# Patient Record
Sex: Female | Born: 1975 | Race: Black or African American | Hispanic: No | Smoking: Never smoker
Health system: Southern US, Community
[De-identification: ages and names within clinical notes are randomized; demographics above are authoritative.]

## PROBLEM LIST (undated history)

## (undated) DIAGNOSIS — Z9221 Personal history of antineoplastic chemotherapy: Secondary | ICD-10-CM

## (undated) DIAGNOSIS — T8859XA Other complications of anesthesia, initial encounter: Secondary | ICD-10-CM

## (undated) DIAGNOSIS — K589 Irritable bowel syndrome without diarrhea: Secondary | ICD-10-CM

## (undated) DIAGNOSIS — Z923 Personal history of irradiation: Secondary | ICD-10-CM

## (undated) DIAGNOSIS — R0600 Dyspnea, unspecified: Secondary | ICD-10-CM

## (undated) DIAGNOSIS — E119 Type 2 diabetes mellitus without complications: Secondary | ICD-10-CM

## (undated) DIAGNOSIS — K219 Gastro-esophageal reflux disease without esophagitis: Secondary | ICD-10-CM

## (undated) DIAGNOSIS — Z8632 Personal history of gestational diabetes: Secondary | ICD-10-CM

## (undated) DIAGNOSIS — K279 Peptic ulcer, site unspecified, unspecified as acute or chronic, without hemorrhage or perforation: Secondary | ICD-10-CM

## (undated) DIAGNOSIS — O210 Mild hyperemesis gravidarum: Secondary | ICD-10-CM

## (undated) DIAGNOSIS — C801 Malignant (primary) neoplasm, unspecified: Secondary | ICD-10-CM

## (undated) DIAGNOSIS — J189 Pneumonia, unspecified organism: Secondary | ICD-10-CM

## (undated) DIAGNOSIS — T7840XA Allergy, unspecified, initial encounter: Secondary | ICD-10-CM

## (undated) DIAGNOSIS — D649 Anemia, unspecified: Secondary | ICD-10-CM

## (undated) HISTORY — DX: Anemia, unspecified: D64.9

## (undated) HISTORY — DX: Irritable bowel syndrome, unspecified: K58.9

## (undated) HISTORY — DX: Type 2 diabetes mellitus without complications: E11.9

## (undated) HISTORY — DX: Peptic ulcer, site unspecified, unspecified as acute or chronic, without hemorrhage or perforation: K27.9

## (undated) HISTORY — DX: Allergy, unspecified, initial encounter: T78.40XA

## (undated) HISTORY — DX: Pneumonia, unspecified organism: J18.9

---

## 1998-01-01 ENCOUNTER — Emergency Department (HOSPITAL_COMMUNITY): Admission: EM | Admit: 1998-01-01 | Discharge: 1998-01-01 | Payer: Self-pay | Admitting: Emergency Medicine

## 1998-04-27 ENCOUNTER — Emergency Department (HOSPITAL_COMMUNITY): Admission: EM | Admit: 1998-04-27 | Discharge: 1998-04-27 | Payer: Self-pay | Admitting: Emergency Medicine

## 1998-06-15 ENCOUNTER — Emergency Department (HOSPITAL_COMMUNITY): Admission: EM | Admit: 1998-06-15 | Discharge: 1998-06-15 | Payer: Self-pay | Admitting: Emergency Medicine

## 1999-03-22 ENCOUNTER — Ambulatory Visit (HOSPITAL_COMMUNITY): Admission: RE | Admit: 1999-03-22 | Discharge: 1999-03-22 | Payer: Self-pay | Admitting: Obstetrics & Gynecology

## 2003-03-16 ENCOUNTER — Emergency Department (HOSPITAL_COMMUNITY): Admission: EM | Admit: 2003-03-16 | Discharge: 2003-03-16 | Payer: Self-pay | Admitting: Emergency Medicine

## 2003-05-20 DIAGNOSIS — Z8632 Personal history of gestational diabetes: Secondary | ICD-10-CM

## 2003-05-20 HISTORY — DX: Personal history of gestational diabetes: Z86.32

## 2004-05-19 HISTORY — PX: LEFT OOPHORECTOMY: SHX1961

## 2012-05-19 HISTORY — PX: BILATERAL SALPINGECTOMY: SHX5743

## 2016-05-05 ENCOUNTER — Ambulatory Visit (INDEPENDENT_AMBULATORY_CARE_PROVIDER_SITE_OTHER): Payer: BLUE CROSS/BLUE SHIELD | Admitting: Physician Assistant

## 2016-05-05 VITALS — BP 130/82 | HR 96 | Temp 98.6°F | Resp 17 | Ht 70.0 in | Wt 186.0 lb

## 2016-05-05 DIAGNOSIS — K0381 Cracked tooth: Secondary | ICD-10-CM | POA: Diagnosis not present

## 2016-05-05 DIAGNOSIS — K0889 Other specified disorders of teeth and supporting structures: Secondary | ICD-10-CM

## 2016-05-05 MED ORDER — AMOXICILLIN-POT CLAVULANATE 875-125 MG PO TABS: 1.0000 | ORAL_TABLET | Freq: Two times a day (BID) | ORAL | 0 refills | Status: AC

## 2016-05-05 NOTE — Progress Notes (Signed)
Patient ID: Catherine Ellison, female     DOB: Jun 04, 1975, 40 y.o.    MRN: [id number]  PCP: No primary care provider on file.  Chief Complaint  Patient presents with  . Dental Pain    Started wednesday. "feels like someone is pulling my jaw out". NKI.     Subjective:   This patient is new to this practice and presents for evaluation of dental pain.  Tooth pain began 6 days ago. Feels a hole in the tooth when she rubs it with her tongue. Pain is constant and throbbing, and worse with chewing or pressing on the tooth.  No fever, chills. No drainage. No swelling of the jaw, neck or face.  Describes the sensation that the tooth is coming out, and that someone is pulling on the RIGHT jaw, laterally.  Had a Investment banker, corporate in [place], [place]. Had one impacted wisdom tooth extracted. Is concerned that she won't be able to get in with a dentist due to the holidays. 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  Review of Systems As above.  Prior to Admission medications   Medication Sig Start Date End Date Taking? Authorizing Provider  dicyclomine (BENTYL) 10 MG capsule Take 10 mg by mouth 4 (four) times daily -  before meals and at bedtime.   Yes Historical Provider, MD     No Known Allergies   There are no active problems to display for this patient.    Family History  Problem Relation Age of Onset  . Diabetes Mother   . Hypertension Mother   . Diabetes Father   . Heart disease Father     4V CABG 03/2016, age 46  . Hyperlipidemia Father   . Hypertension Father   . Diabetes Sister   . Hypertension Sister      Social History   Social History  . Marital status: Divorced    Spouse name: N/A  . Number of children: N/A  . Years of education: N/A   Occupational History  . nurse     mental health   Social History Main Topics  . Smoking status: Never Smoker  . Smokeless tobacco: Never Used  . Alcohol use 1.2 oz/week    2  Standard drinks or equivalent per week  . Drug use: Unknown  . Sexual activity: Not on file   Other Topics Concern  . Not on file   Social History Narrative  . No narrative on file         Objective:  Physical Exam  Constitutional: She is oriented to person, place, and time. She appears well-developed and well-nourished. She is active and cooperative. No distress.  BP 130/82 (BP Location: Left Arm, Patient Position: Sitting, Cuff Size: Normal)   Pulse 96   Temp 98.6 F (37 C) (Oral)   Resp 17   Ht 5\' 10"  (1.778 m)   Wt 186 lb (84.4 kg)   LMP 04/03/2016 (Approximate)   SpO2 99%   BMI 26.69 kg/m    HENT:  Head: Normocephalic and atraumatic.  Right Ear: Hearing normal.  Left Ear: Hearing normal.  Mouth/Throat: Uvula is midline, oropharynx is clear and moist and mucous membranes are normal. No oral lesions. Abnormal dentition. Dental caries present.    Eyes: Conjunctivae are normal. No scleral icterus.  Neck: Normal range of motion. Neck supple. No thyromegaly present.  Pulmonary/Chest: Effort normal.  Lymphadenopathy:    She has no cervical adenopathy.  Neurological: She is alert and oriented to person, place, and time.  Skin: Skin is warm and dry.  Psychiatric: She has a normal mood and affect. Her speech is normal and behavior is normal.             Assessment & Plan:  1. Pain, dental 2. Broken or cracked tooth, nontraumatic Suspect infection, given constant pain. NSAIDS. Antibiotic. Chew on the LEFT side of the mouth, soft foods. Schedule with dentists as soon as possible. - amoxicillin-clavulanate (AUGMENTIN) 875-125 MG tablet; Take 1 tablet by mouth 2 (two) times daily.  Dispense: 20 tablet; Refill: 0   Fara Chute, PA-C Physician Assistant-Certified [facility]

## 2016-05-05 NOTE — Patient Instructions (Addendum)
Get scheduled with a dentist as soon as you are able. Use ibuprofen and/or acetaminophen for pain.    IF you received an x-ray today, you will receive an invoice from [facility]. Please contact [facility] at [phone] with questions or concerns regarding your invoice.   IF you received labwork today, you will receive an invoice from [facility]. Please contact [facility] at [phone] with questions or concerns regarding your invoice.   Our billing staff will not be able to assist you with questions regarding bills from these companies.  You will be contacted with the lab results as soon as they are available. The fastest way to get your results is to activate your My Chart account. Instructions are located on the last page of this paperwork. If you have not heard from [place] regarding the results in 2 weeks, please contact this office.

## 2016-08-20 ENCOUNTER — Ambulatory Visit (INDEPENDENT_AMBULATORY_CARE_PROVIDER_SITE_OTHER): Payer: BLUE CROSS/BLUE SHIELD | Admitting: Physician Assistant

## 2016-08-20 VITALS — BP 115/72 | HR 96 | Temp 98.2°F | Resp 16 | Ht 70.0 in | Wt 182.4 lb

## 2016-08-20 DIAGNOSIS — Z0000 Encounter for general adult medical examination without abnormal findings: Secondary | ICD-10-CM | POA: Diagnosis not present

## 2016-08-20 DIAGNOSIS — Z789 Other specified health status: Secondary | ICD-10-CM | POA: Diagnosis not present

## 2016-08-20 DIAGNOSIS — Z23 Encounter for immunization: Secondary | ICD-10-CM

## 2016-08-20 DIAGNOSIS — K5900 Constipation, unspecified: Secondary | ICD-10-CM

## 2016-08-20 DIAGNOSIS — Z13220 Encounter for screening for lipoid disorders: Secondary | ICD-10-CM | POA: Diagnosis not present

## 2016-08-20 DIAGNOSIS — Z1329 Encounter for screening for other suspected endocrine disorder: Secondary | ICD-10-CM | POA: Diagnosis not present

## 2016-08-20 DIAGNOSIS — Z130 Encounter for screening for diseases of the blood and blood-forming organs and certain disorders involving the immune mechanism: Secondary | ICD-10-CM | POA: Diagnosis not present

## 2016-08-20 DIAGNOSIS — G479 Sleep disorder, unspecified: Secondary | ICD-10-CM

## 2016-08-20 DIAGNOSIS — Z131 Encounter for screening for diabetes mellitus: Secondary | ICD-10-CM

## 2016-08-20 DIAGNOSIS — Z114 Encounter for screening for human immunodeficiency virus [HIV]: Secondary | ICD-10-CM | POA: Diagnosis not present

## 2016-08-20 MED ORDER — POLYETHYLENE GLYCOL 3350 17 GM/SCOOP PO POWD: 17.0000 g | Freq: Two times a day (BID) | ORAL | 1 refills | Status: DC | PRN

## 2016-08-20 MED ORDER — MELATONIN 3 MG PO TABS: 3.0000 mg | ORAL_TABLET | Freq: Every day | ORAL | 2 refills | Status: DC

## 2016-08-20 NOTE — Patient Instructions (Addendum)
Take Melatonin to help you sleep.   Thank you for coming in today. I hope you feel we met your needs.  Feel free to call [facility] if you have any questions or further requests.  Please consider signing up for MyChart if you do not already have it, as this is a great way to communicate with me.  Best,  Whitney , PA-C  For constipation   Make sure you are drinking enough water daily -- 1 to 3 liters/day.   Make sure you are getting enough fiber in your diet - this will make you regular - you can eat high fiber foods or use metamucil as a supplement - it is really important to drink enough water when using fiber supplements.  If your stools are hard or are formed balls or you have to strain a stool softener will help - use colace 2-3 capsule a day  1) For gentle treatment of constipation Use Miralax 1-2 capfuls a day until your stools are soft and regular and then decrease the usage - you can use this daily  2) For more aggressive treatment of constipation Use 4 capfuls of Colace and 6 doses of Miralax and drink it in 2 hours - this should result in several watery stools - if it does not repeat the next day and then go to daily miralax for a week to make sure your bowels are clean and retrained to work properly  3) For the most aggressive treatment of constipation Use 14 capfuls of Miralax in 1 gallon of fluid (gatoraid or water work well or a combination of the two) and drink over 12h - it is ok to eat during this time and then use Miralax 1 capful daily for about 2 weeks to prevent the constipation from returning   Constipation, Adult Constipation is when a person has fewer bowel movements in a week than normal, has difficulty having a bowel movement, or has stools that are dry, hard, or larger than normal. Constipation may be caused by an underlying condition. It may become worse with age if a person takes certain medicines and does not take in enough fluids. Follow these instructions  at home: Eating and drinking    Eat foods that have a lot of fiber, such as fresh fruits and vegetables, whole grains, and beans.  Limit foods that are high in fat, low in fiber, or overly processed, such as french fries, hamburgers, cookies, candies, and soda.  Drink enough fluid to keep your urine clear or pale yellow. General instructions   Exercise regularly or as told by your health care provider.  Go to the restroom when you have the urge to go. Do not hold it in.  Take over-the-counter and prescription medicines only as told by your health care provider. These include any fiber supplements.  Practice pelvic floor retraining exercises, such as deep breathing while relaxing the lower abdomen and pelvic floor relaxation during bowel movements.  Watch your condition for any changes.  Keep all follow-up visits as told by your health care provider. This is important. Contact a health care provider if:  You have pain that gets worse.  You have a fever.  You do not have a bowel movement after 4 days.  You vomit.  You are not hungry.  You lose weight.  You are bleeding from the anus.  You have thin, pencil-like stools. Get help right away if:  You have a fever and your symptoms suddenly get worse.    You leak stool or have blood in your stool.  Your abdomen is bloated.  You have severe pain in your abdomen.  You feel dizzy or you faint. This information is not intended to replace advice given to you by your health care provider. Make sure you discuss any questions you have with your health care provider. Document Released: 02/01/2004 Document Revised: 11/23/2015 Document Reviewed: 10/24/2015 Elsevier Interactive Patient Education  2017 [employer]   Health Maintenance, Female Adopting a healthy lifestyle and getting preventive care can go a long way to promote health and wellness. Talk with your health care provider about what schedule of regular examinations is  right for you. This is a good chance for you to check in with your provider about disease prevention and staying healthy. In between checkups, there are plenty of things you can do on your own. Experts have done a lot of research about which lifestyle changes and preventive measures are most likely to keep you healthy. Ask your health care provider for more information. Weight and diet Eat a healthy diet  Be sure to include plenty of vegetables, fruits, low-fat dairy products, and lean protein.  Do not eat a lot of foods high in solid fats, added sugars, or salt.  Get regular exercise. This is one of the most important things you can do for your health.  Most adults should exercise for at least 150 minutes each week. The exercise should increase your heart rate and make you sweat (moderate-intensity exercise).  Most adults should also do strengthening exercises at least twice a week. This is in addition to the moderate-intensity exercise. Maintain a healthy weight  Body mass index (BMI) is a measurement that can be used to identify possible weight problems. It estimates body fat based on height and weight. Your health care provider can help determine your BMI and help you achieve or maintain a healthy weight.  For females 20 years of age and older:  A BMI below 18.5 is considered underweight.  A BMI of 18.5 to 24.9 is normal.  A BMI of 25 to 29.9 is considered overweight.  A BMI of 30 and above is considered obese. Watch levels of cholesterol and blood lipids  You should start having your blood tested for lipids and cholesterol at 43 years of age, then have this test every 5 years.  You may need to have your cholesterol levels checked more often if:  Your lipid or cholesterol levels are high.  You are older than 43 years of age.  You are at high risk for heart disease. Cancer screening Lung Cancer  Lung cancer screening is recommended for adults 55-80 years old who are at  high risk for lung cancer because of a history of smoking.  A yearly low-dose CT scan of the lungs is recommended for people who:  Currently smoke.  Have quit within the past 15 years.  Have at least a 30-pack-year history of smoking. A pack year is smoking an average of one pack of cigarettes a day for 1 year.  Yearly screening should continue until it has been 15 years since you quit.  Yearly screening should stop if you develop a health problem that would prevent you from having lung cancer treatment. Breast Cancer  Practice breast self-awareness. This means understanding how your breasts normally appear and feel.  It also means doing regular breast self-exams. Let your health care provider know about any changes, no matter how small.  If you are in   your 20s or 30s, you should have a clinical breast exam (CBE) by a health care provider every 1-3 years as part of a regular health exam.  If you are 40 or older, have a CBE every year. Also consider having a breast X-ray (mammogram) every year.  If you have a family history of breast cancer, talk to your health care provider about genetic screening.  If you are at high risk for breast cancer, talk to your health care provider about having an MRI and a mammogram every year.  Breast cancer gene (BRCA) assessment is recommended for women who have family members with BRCA-related cancers. BRCA-related cancers include:  Breast.  Ovarian.  Tubal.  Peritoneal cancers.  Results of the assessment will determine the need for genetic counseling and BRCA1 and BRCA2 testing. Cervical Cancer  Your health care provider may recommend that you be screened regularly for cancer of the pelvic organs (ovaries, uterus, and vagina). This screening involves a pelvic examination, including checking for microscopic changes to the surface of your cervix (Pap test). You may be encouraged to have this screening done every 3 years, beginning at age  21.  For women ages 30-65, health care providers may recommend pelvic exams and Pap testing every 3 years, or they may recommend the Pap and pelvic exam, combined with testing for human papilloma virus (HPV), every 5 years. Some types of HPV increase your risk of cervical cancer. Testing for HPV may also be done on women of any age with unclear Pap test results.  Other health care providers may not recommend any screening for nonpregnant women who are considered low risk for pelvic cancer and who do not have symptoms. Ask your health care provider if a screening pelvic exam is right for you.  If you have had past treatment for cervical cancer or a condition that could lead to cancer, you need Pap tests and screening for cancer for at least 20 years after your treatment. If Pap tests have been discontinued, your risk factors (such as having a new sexual partner) need to be reassessed to determine if screening should resume. Some women have medical problems that increase the chance of getting cervical cancer. In these cases, your health care provider may recommend more frequent screening and Pap tests. Colorectal Cancer  This type of cancer can be detected and often prevented.  Routine colorectal cancer screening usually begins at 43 years of age and continues through 43 years of age.  Your health care provider may recommend screening at an earlier age if you have risk factors for colon cancer.  Your health care provider may also recommend using home test kits to check for hidden blood in the stool.  A small camera at the end of a tube can be used to examine your colon directly (sigmoidoscopy or colonoscopy). This is done to check for the earliest forms of colorectal cancer.  Routine screening usually begins at age 50.  Direct examination of the colon should be repeated every 5-10 years through 43 years of age. However, you may need to be screened more often if early forms of precancerous polyps  or small growths are found. Skin Cancer  Check your skin from head to toe regularly.  Tell your health care provider about any new moles or changes in moles, especially if there is a change in a mole's shape or color.  Also tell your health care provider if you have a mole that is larger than the size of a   pencil eraser.  Always use sunscreen. Apply sunscreen liberally and repeatedly throughout the day.  Protect yourself by wearing long sleeves, pants, a wide-brimmed hat, and sunglasses whenever you are outside. Heart disease, diabetes, and high blood pressure  High blood pressure causes heart disease and increases the risk of stroke. High blood pressure is more likely to develop in:  People who have blood pressure in the high end of the normal range (130-139/85-89 mm Hg).  People who are overweight or obese.  People who are African American.  If you are 18-39 years of age, have your blood pressure checked every 3-5 years. If you are 40 years of age or older, have your blood pressure checked every year. You should have your blood pressure measured twice-once when you are at a hospital or clinic, and once when you are not at a hospital or clinic. Record the average of the two measurements. To check your blood pressure when you are not at a hospital or clinic, you can use:  An automated blood pressure machine at a pharmacy.  A home blood pressure monitor.  If you are between 55 years and 79 years old, ask your health care provider if you should take aspirin to prevent strokes.  Have regular diabetes screenings. This involves taking a blood sample to check your fasting blood sugar level.  If you are at a normal weight and have a low risk for diabetes, have this test once every three years after 43 years of age.  If you are overweight and have a high risk for diabetes, consider being tested at a younger age or more often. Preventing infection Hepatitis B  If you have a higher risk  for hepatitis B, you should be screened for this virus. You are considered at high risk for hepatitis B if:  You were born in a country where hepatitis B is common. Ask your health care provider which countries are considered high risk.  Your parents were born in a high-risk country, and you have not been immunized against hepatitis B (hepatitis B vaccine).  You have HIV or AIDS.  You use needles to inject street drugs.  You live with someone who has hepatitis B.  You have had sex with someone who has hepatitis B.  You get hemodialysis treatment.  You take certain medicines for conditions, including cancer, organ transplantation, and autoimmune conditions. Hepatitis C  Blood testing is recommended for:  Everyone born from 1945 through 1965.  Anyone with known risk factors for hepatitis C. Sexually transmitted infections (STIs)  You should be screened for sexually transmitted infections (STIs) including gonorrhea and chlamydia if:  You are sexually active and are younger than 43 years of age.  You are older than 43 years of age and your health care provider tells you that you are at risk for this type of infection.  Your sexual activity has changed since you were last screened and you are at an increased risk for chlamydia or gonorrhea. Ask your health care provider if you are at risk.  If you do not have HIV, but are at risk, it may be recommended that you take a prescription medicine daily to prevent HIV infection. This is called pre-exposure prophylaxis (PrEP). You are considered at risk if:  You are sexually active and do not regularly use condoms or know the HIV status of your partner(s).  You take drugs by injection.  You are sexually active with a partner who has HIV. Talk with your health   care provider about whether you are at high risk of being infected with HIV. If you choose to begin PrEP, you should first be tested for HIV. You should then be tested every 3 months  for as long as you are taking PrEP. Pregnancy  If you are premenopausal and you may become pregnant, ask your health care provider about preconception counseling.  If you may become pregnant, take 400 to 800 micrograms (mcg) of folic acid every day.  If you want to prevent pregnancy, talk to your health care provider about birth control (contraception). Osteoporosis and menopause  Osteoporosis is a disease in which the bones lose minerals and strength with aging. This can result in serious bone fractures. Your risk for osteoporosis can be identified using a bone density scan.  If you are 65 years of age or older, or if you are at risk for osteoporosis and fractures, ask your health care provider if you should be screened.  Ask your health care provider whether you should take a calcium or vitamin D supplement to lower your risk for osteoporosis.  Menopause may have certain physical symptoms and risks.  Hormone replacement therapy may reduce some of these symptoms and risks. Talk to your health care provider about whether hormone replacement therapy is right for you. Follow these instructions at home:  Schedule regular health, dental, and eye exams.  Stay current with your immunizations.  Do not use any tobacco products including cigarettes, chewing tobacco, or electronic cigarettes.  If you are pregnant, do not drink alcohol.  If you are breastfeeding, limit how much and how often you drink alcohol.  Limit alcohol intake to no more than 1 drink per day for nonpregnant women. One drink equals 12 ounces of beer, 5 ounces of wine, or 1 ounces of hard liquor.  Do not use street drugs.  Do not share needles.  Ask your health care provider for help if you need support or information about quitting drugs.  Tell your health care provider if you often feel depressed.  Tell your health care provider if you have ever been abused or do not feel safe at home. This information is not  intended to replace advice given to you by your health care provider. Make sure you discuss any questions you have with your health care provider. Document Released: 11/18/2010 Document Revised: 10/11/2015 Document Reviewed: 02/06/2015 Elsevier Interactive Patient Education  2017 [employer].

## 2016-08-20 NOTE — Progress Notes (Signed)
[facility] at [place], [place] [address] [phone]  Date:  08/20/2016   Name:  Catherine Ellison   DOB:  1976-04-13   MRN:  [id number]  PCP:  No PCP Per Patient    Chief Complaint: Annual Exam   History of Present Illness:  This is a 41 y.o. female with PMH allergies and anemia who is presenting for CPE. She has 3 children. 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 She needs titers drawn today for school.  She plans to become an NP. She lives in [place], [place] She is fasting.   H/o IBS - Bentyl qd. Mostly controlled.   Complaints: Wakes up/tosses and turns at night. She admits to snoring, denies apnea.  LMP: Jul 31, 2016. Periods are regularly irregular.  Contraception: none. H/o b/l salpingectomy.  Last pap: 2017 in [place]. Negative. She performs regular breast exams.  Sexual history: Active with current boyfriend x 2.5 years. No other partners.  Immunizations: UTD Dentist: sees q yearly. Eye: regular checks.  Diet/Exercise: exercises 4-5 days/week at [employer]. Vegetables, herbs, fish, salads, noodles. Drinks warm lemon water. She recently cut back on sodas. Fast food every once in a while.   Fam hx: mother- HTN and DM; father - DM, heart disease, HTN, HLD; sister DM and HTN.   Tobacco/alcohol/substance use: Non smoker, no drugs. Social drinker: 3 drinks/week   Colonoscopy: 2004 for IBS/Diverticulitis.   Review of Systems:  Review of Systems  Constitutional: Negative for chills, diaphoresis, fatigue and fever.  HENT: Negative for congestion, postnasal drip, rhinorrhea, sinus pressure, sneezing and sore throat.   Respiratory: Negative for cough, chest tightness, shortness of breath and wheezing.   Cardiovascular: Negative for chest pain and palpitations.  Gastrointestinal: Positive for abdominal pain (due to IBS). Negative for diarrhea, nausea and vomiting.  Genitourinary: Negative for decreased urine volume, difficulty urinating, dysuria, enuresis, flank  pain, frequency, hematuria and urgency.  Musculoskeletal: Negative for back pain.  Neurological: Negative for dizziness, weakness, light-headedness and headaches.  Psychiatric/Behavioral: Positive for sleep disturbance.    There are no active problems to display for this patient.   Prior to Admission medications   Medication Sig Start Date End Date Taking? Authorizing Provider  dicyclomine (BENTYL) 10 MG capsule Take 10 mg by mouth 4 (four) times daily -  before meals and at bedtime.   Yes Historical Provider, MD    No Known Allergies  No past surgical history on file.  Social History  Substance Use Topics  . Smoking status: Never Smoker  . Smokeless tobacco: Never Used  . Alcohol use 1.2 oz/week    2 Standard drinks or equivalent per week    Family History  Problem Relation Age of Onset  . Diabetes Mother   . Hypertension Mother   . Diabetes Father   . Heart disease Father     4V CABG 03/2016, age 29  . Hyperlipidemia Father   . Hypertension Father   . Diabetes Sister   . Hypertension Sister     Medication list has been reviewed and updated.  Physical Examination:  Physical Exam  Constitutional: She is oriented to person, place, and time. She appears well-developed and well-nourished. No distress.  HENT:  Head: Normocephalic and atraumatic.  Mouth/Throat: Oropharynx is clear and moist.  Eyes: Conjunctivae and EOM are normal. Pupils are equal, round, and reactive to light.  Cardiovascular: Normal rate, regular rhythm and normal heart sounds.   No murmur heard. Pulmonary/Chest: Effort normal and breath sounds normal.  She has no wheezes.  Abdominal: Soft. Normal appearance and bowel sounds are normal. There is no hepatosplenomegaly. There is generalized tenderness. There is no rigidity, no rebound, no guarding and negative Murphy's sign.  Musculoskeletal: Normal range of motion.  Neurological: She is alert and oriented to person, place, and time. She has normal  reflexes.  Skin: Skin is warm and dry.  Psychiatric: She has a normal mood and affect. Her behavior is normal. Judgment and thought content normal.  Vitals reviewed.   BP 115/72   Pulse 96   Temp 98.2 F (36.8 C) (Oral)   Resp 16   Ht _0  (1.778 m)   Wt 182 lb 6.4 oz (82.7 kg)   LMP 08/01/2016   SpO2 98%   BMI 26.17 kg/m   Assessment and Plan: 1. Annual physical exam - Pt presents for annual exam - needs paper work and titers drawn for nursing school. IBS controlled with Bentyl. C/o constipation and difficulty sleeping through the night - will treat. RTC in 1 year for fasting labs. - Labs are pending. Will contact with results.    2. Constipation, unspecified constipation type - polyethylene glycol powder (GLYCOLAX/MIRALAX) powder; Take 17 g by mouth 2 (two) times daily as needed.  Dispense: 578 g; Refill: 1 - Encouraged fluids, fiber, fitness.   3. Sleep disorder - Melatonin 3 MG TABS; Take 1 tablet (3 mg total) by mouth at bedtime.  Dispense: 60 tablet; Refill: 2 - Sleep hygiene discussed with pt.   4. Screening for HIV (human immunodeficiency virus) - HIV antibody  5. Screening for diabetes mellitus - CMP14+EGFR  6. Screening, anemia, deficiency, iron - CBC w diff  7. Screening for thyroid disorder - TSH  8. Screening, lipid - Lipid panel  9. Need for diphtheria-tetanus-pertussis (Tdap) vaccine - Tdap vaccine greater than or equal to 7yo IM  10. History of measles, mumps, rubella (MMR) vaccination unknown - Measles/Mumps/Rubella Immunity  11. Varicella vaccination status unknown - Varicella zoster antibody, IgG  12. Hepatitis B vaccination status unknown - Hepatitis B e antibody   Mercer Pod, PA-C  [facility] 08/20/2016 4:43 PM

## 2016-08-21 LAB — LIPID PANEL
Chol/HDL Ratio: 3.2 ratio (ref 0.0–4.4)
Cholesterol, Total: 136 mg/dL (ref 100–199)
HDL: 43 mg/dL (ref >39)
LDL Calculated: 77 mg/dL (ref 0–99)
Triglycerides: 82 mg/dL (ref 0–149)
VLDL Cholesterol Cal: 16 mg/dL (ref 5–40)

## 2016-08-21 LAB — CBC WITH DIFFERENTIAL/PLATELET
Basophils Absolute: 0 10*3/uL (ref 0.0–0.2)
Basos: 0 %
EOS (ABSOLUTE): 0.1 10*3/uL (ref 0.0–0.4)
Eos: 2 %
Hematocrit: 33.8 % — ABNORMAL LOW (ref 34.0–46.6)
Hemoglobin: 10.6 g/dL — ABNORMAL LOW (ref 11.1–15.9)
Immature Grans (Abs): 0 10*3/uL (ref 0.0–0.1)
Immature Granulocytes: 0 %
Lymphocytes Absolute: 2.5 10*3/uL (ref 0.7–3.1)
Lymphs: 31 %
MCH: 25.3 pg — ABNORMAL LOW (ref 26.6–33.0)
MCHC: 31.4 g/dL — ABNORMAL LOW (ref 31.5–35.7)
MCV: 81 fL (ref 79–97)
Monocytes Absolute: 0.5 10*3/uL (ref 0.1–0.9)
Monocytes: 6 %
Neutrophils Absolute: 4.8 10*3/uL (ref 1.4–7.0)
Neutrophils: 61 %
Platelets: 342 10*3/uL (ref 150–379)
RBC: 4.19 x10E6/uL (ref 3.77–5.28)
RDW: 16.3 % — ABNORMAL HIGH (ref 12.3–15.4)
WBC: 8 10*3/uL (ref 3.4–10.8)

## 2016-08-21 LAB — HEPATITIS B E ANTIBODY: Hep B E Ab: NEGATIVE

## 2016-08-21 LAB — CMP14+EGFR
ALT: 9 IU/L (ref 0–32)
AST: 18 IU/L (ref 0–40)
Albumin/Globulin Ratio: 1.4 (ref 1.2–2.2)
Albumin: 4 g/dL (ref 3.5–5.5)
Alkaline Phosphatase: 93 IU/L (ref 39–117)
BUN/Creatinine Ratio: 12 (ref 9–23)
BUN: 11 mg/dL (ref 6–24)
Bilirubin Total: 0.3 mg/dL (ref 0.0–1.2)
CO2: 18 mmol/L (ref 18–29)
Calcium: 8.9 mg/dL (ref 8.7–10.2)
Chloride: 103 mmol/L (ref 96–106)
Creatinine, Ser: 0.93 mg/dL (ref 0.57–1.00)
GFR calc Af Amer: 88 mL/min/{1.73_m2} (ref >59)
GFR calc non Af Amer: 76 mL/min/{1.73_m2} (ref >59)
Globulin, Total: 2.9 g/dL (ref 1.5–4.5)
Glucose: 94 mg/dL (ref 65–99)
Potassium: 4.3 mmol/L (ref 3.5–5.2)
Sodium: 138 mmol/L (ref 134–144)
Total Protein: 6.9 g/dL (ref 6.0–8.5)

## 2016-08-21 LAB — TSH: TSH: 1.8 u[IU]/mL (ref 0.450–4.500)

## 2016-08-21 LAB — VARICELLA ZOSTER ANTIBODY, IGG: Varicella zoster IgG: 800 index (ref >165)

## 2016-08-21 LAB — HIV ANTIBODY (ROUTINE TESTING W REFLEX): HIV Screen 4th Generation wRfx: NONREACTIVE

## 2016-08-21 LAB — MEASLES/MUMPS/RUBELLA IMMUNITY
MUMPS ABS, IGG: 76.8 AU/mL (ref >10.9)
RUBEOLA AB, IGG: >300 AU/mL (ref >29.9)
Rubella Antibodies, IGG: 1.85 index (ref >0.99)

## 2016-08-25 ENCOUNTER — Telehealth: Payer: Self-pay

## 2016-08-25 NOTE — Telephone Encounter (Signed)
TY. Will release to mychart.

## 2016-08-25 NOTE — Telephone Encounter (Signed)
Pt came in for lab results, copies given, please review, any concerns?

## 2016-09-09 ENCOUNTER — Encounter: Payer: Self-pay | Admitting: Family Medicine

## 2016-09-09 ENCOUNTER — Ambulatory Visit (INDEPENDENT_AMBULATORY_CARE_PROVIDER_SITE_OTHER): Payer: BLUE CROSS/BLUE SHIELD | Admitting: Family Medicine

## 2016-09-09 VITALS — BP 110/72 | HR 86 | Temp 98.0°F | Resp 18 | Ht 70.04 in | Wt 179.6 lb

## 2016-09-09 DIAGNOSIS — R030 Elevated blood-pressure reading, without diagnosis of hypertension: Secondary | ICD-10-CM | POA: Diagnosis not present

## 2016-09-09 DIAGNOSIS — Z111 Encounter for screening for respiratory tuberculosis: Secondary | ICD-10-CM | POA: Diagnosis not present

## 2016-09-09 NOTE — Progress Notes (Signed)
  Tuberculosis Risk Questionnaire  1. No Were you born outside the [place] in one of the following parts of the world: [place], [place], [place], [place] or [place]?    2. No Have you traveled outside the [place] and lived for more than one month in one of the following parts of the world: [place], [place], [place], [place] or [place]?    3. No Do you have a compromised immune system such as from any of the following conditions:HIV/AIDS, organ or bone marrow transplantation, diabetes, immunosuppressive medicines (e.g. Prednisone, Remicaide), leukemia, lymphoma, cancer of the head or neck, gastrectomy or jejunal bypass, end-stage renal disease (on dialysis), or silicosis?     4. YES Have you ever or do you plan on working in: a residential care center, a health care facility, a jail or prison or homeless shelter?    5. No Have you ever: injected illegal drugs, used crack cocaine, lived in a homeless shelter  or been in jail or prison?     6. No Have you ever been exposed to anyone with infectious tuberculosis?    Tuberculosis Symptom Questionnaire  Do you currently have any of the following symptoms?  1. No Unexplained cough lasting more than 3 weeks?   2. No Unexplained fever lasting more than 3 weeks.   3. No Night Sweats (sweating that leaves the bedclothes and sheets wet)     4. No Shortness of Breath   5. No Chest Pain   6. No Unintentional weight loss    7. No Unexplained fatigue (very tired for no reason)

## 2016-09-09 NOTE — Progress Notes (Signed)
  Chief Complaint  Patient presents with  . 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 Allergy    seasonal   . Anemia   . Diabetes mellitus without complication [facility])    Gestational    Current Outpatient Prescriptions  Medication Sig Dispense Refill  . dicyclomine (BENTYL) 10 MG capsule Take 10 mg by mouth 4 (four) times daily -  before meals and at bedtime.    . Melatonin 3 MG TABS Take 1 tablet (3 mg total) by mouth at bedtime. 60 tablet 2  . polyethylene glycol powder (GLYCOLAX/MIRALAX) powder Take 17 g by mouth 2 (two) times daily as needed. 578 g 1   No current facility-administered medications for this visit.     Allergies: No Known Allergies  Past Surgical History:  Procedure Laterality Date  . BILATERAL SALPINGECTOMY Bilateral 2014   emergency  . LEFT OOPHORECTOMY Left 2006    Social History   Social History  . Marital status: Divorced    Spouse name: N/A  . Number of children: N/A  . Years of education: N/A   Occupational History  . nurse     mental health   Social History Main Topics  . Smoking status: Never Smoker  . Smokeless tobacco: Never Used  . Alcohol use 3.0 oz/week    2 Standard drinks or equivalent, 1 Shots of liquor, 1 Cans of beer, 1 Glasses of wine per week  . Drug use: No  . Sexual activity: Not Asked   Other Topics Concern  . None   Social History Narrative   Lives with her youngest child.   Older children are in college.    Review of Systems  Constitutional: Negative for chills, fever, malaise/fatigue and weight loss.  Respiratory: Negative for cough, hemoptysis, sputum  production, shortness of breath and wheezing.   Neurological: Negative for weakness.    Objective: Vitals:   09/09/16 1155 09/09/16 1216  BP: (!) 158/100 110/72  Pulse: 86   Resp: 18   Temp: 98 F (36.7 C)   TempSrc: Oral   SpO2: 100%   Weight: 179 lb 9.6 oz (81.5 kg)   Height: 5' 10.04" (1.779 m)     Physical Exam  Constitutional: She is oriented to person, place, and time. She appears well-developed and well-nourished.  HENT:  Head: Normocephalic and atraumatic.  Cardiovascular: Normal rate and regular rhythm.   Pulmonary/Chest: Effort normal and breath sounds normal. No respiratory distress.  Neurological: She is alert and oriented to person, place, and time.  Psychiatric: She has a normal mood and affect. Her behavior is normal. Judgment and thought content normal.    Assessment and Plan Jacqlyn was seen today for ppd reading.  Diagnoses and all orders for this visit:  Screening-pulmonary TB -     TB Skin Test  Elevated BP without diagnosis of hypertension- bp recheck was normal Discussed home bp monitoring     Zelma Snead A Barnett Elzey

## 2016-09-09 NOTE — Patient Instructions (Addendum)
IF you received an x-ray today, you will receive an invoice from [facility]. Please contact [facility] at [phone] with questions or concerns regarding your invoice.   IF you received labwork today, you will receive an invoice from [facility]. Please contact [facility] at [phone] with questions or concerns regarding your invoice.   Our billing staff will not be able to assist you with questions regarding bills from these companies.  You will be contacted with the lab results as soon as they are available. The fastest way to get your results is to activate your My Chart account. Instructions are located on the last page of this paperwork. If you have not heard from [place] regarding the results in 2 weeks, please contact this office.      How to Take Your Blood Pressure Blood pressure is a measurement of how strongly your blood is pressing against the walls of your arteries. Arteries are blood vessels that carry blood from your heart throughout your body. Your health care provider takes your blood pressure at each office visit. You can also take your own blood pressure at home with a blood pressure machine. You may need to take your own blood pressure:  To confirm a diagnosis of high blood pressure (hypertension).  To monitor your blood pressure over time.  To make sure your blood pressure medicine is working. Supplies needed: To take your blood pressure, you will need a blood pressure machine. You can buy a blood pressure machine, or blood pressure monitor, at most drugstores or online. There are several types of home blood pressure monitors. When choosing one, consider the following:  Choose a monitor that has an arm cuff.  Choose a monitor that wraps snugly around your upper arm. You should be able to fit only one finger between your arm and the cuff.  Do not choose a monitor that measures your blood pressure from your wrist or finger. Your health care  provider can suggest a reliable monitor that will meet your needs. How to prepare To get the most accurate reading, avoid the following for 30 minutes before you check your blood pressure:  Drinking caffeine.  Drinking alcohol.  Eating.  Smoking.  Exercising. Five minutes before you check your blood pressure:  Empty your bladder.  Sit quietly without talking in a dining chair, rather than in a soft couch or armchair. How to take your blood pressure To check your blood pressure, follow the instructions in the manual that came with your blood pressure monitor. If you have a digital blood pressure monitor, the instructions may be as follows: 1. Sit up straight. 2. Place your feet on the floor. Do not cross your ankles or legs. 3. Rest your left arm at the level of your heart on a table or desk or on the arm of a chair. 4. Pull up your shirt sleeve. 5. Wrap the blood pressure cuff around the upper part of your left arm, 1 inch (2.5 cm) above your elbow. It is best to wrap the cuff around bare skin. 6. Fit the cuff snugly around your arm. You should be able to place only one finger between the cuff and your arm. 7. Position the cord inside the groove of your elbow. 8. Press the power button. 9. Sit quietly while the cuff inflates and deflates. 10. Read the digital reading on the monitor screen and write it down (record it). 11. Wait 2-3 minutes, then repeat the steps, starting at step 1.  What does my blood pressure reading mean? A blood pressure reading consists of a higher number over a lower number. Ideally, your blood pressure should be below 120/80. The first ("top") number is called the systolic pressure. It is a measure of the pressure in your arteries as your heart beats. The second ("bottom") number is called the diastolic pressure. It is a measure of the pressure in your arteries as the heart relaxes. Blood pressure is classified into four stages. The following are the stages for  adults who do not have a short-term serious illness or a chronic condition. Systolic pressure and diastolic pressure are measured in a unit called mm Hg. Normal   Systolic pressure: below 784.  Diastolic pressure: below 80. Elevated   Systolic pressure: 696-295.  Diastolic pressure: below 80. Hypertension stage 1   Systolic pressure: 284-132.  Diastolic pressure: 44-01. Hypertension stage 2   Systolic pressure: 027 or above.  Diastolic pressure: 90 or above. You can have prehypertension or hypertension even if only the systolic or only the diastolic number in your reading is higher than normal. Follow these instructions at home:  Check your blood pressure as often as recommended by your health care provider.  Take your monitor to the next appointment with your health care provider to make sure:  That you are using it correctly.  That it provides accurate readings.  Be sure you understand what your goal blood pressure numbers are.  Tell your health care provider if you are having any side effects from blood pressure medicine. Contact a health care provider if:  Your blood pressure is consistently high. Get help right away if:  Your systolic blood pressure is higher than 180.  Your diastolic blood pressure is higher than 110. This information is not intended to replace advice given to you by your health care provider. Make sure you discuss any questions you have with your health care provider. Document Released: 10/12/2015 Document Revised: 12/25/2015 Document Reviewed: 10/12/2015 Elsevier Interactive Patient Education  2017 [employer].

## 2016-09-12 ENCOUNTER — Ambulatory Visit (INDEPENDENT_AMBULATORY_CARE_PROVIDER_SITE_OTHER): Payer: BLUE CROSS/BLUE SHIELD | Admitting: Urgent Care

## 2016-09-12 DIAGNOSIS — Z111 Encounter for screening for respiratory tuberculosis: Secondary | ICD-10-CM

## 2016-09-12 LAB — TB SKIN TEST
Induration: 0 mm
TB Skin Test: NEGATIVE

## 2016-09-16 ENCOUNTER — Ambulatory Visit (INDEPENDENT_AMBULATORY_CARE_PROVIDER_SITE_OTHER): Payer: BLUE CROSS/BLUE SHIELD | Admitting: Physician Assistant

## 2016-09-16 DIAGNOSIS — Z299 Encounter for prophylactic measures, unspecified: Secondary | ICD-10-CM

## 2016-09-16 NOTE — Patient Instructions (Signed)
     IF you received an x-ray today, you will receive an invoice from [facility]. Please contact [facility] at [phone] with questions or concerns regarding your invoice.   IF you received labwork today, you will receive an invoice from [facility]. Please contact [facility] at [phone] with questions or concerns regarding your invoice.   Our billing staff will not be able to assist you with questions regarding bills from these companies.  You will be contacted with the lab results as soon as they are available. The fastest way to get your results is to activate your My Chart account. Instructions are located on the last page of this paperwork. If you have not heard from us regarding the results in 2 weeks, please contact this office.     

## 2016-09-16 NOTE — Progress Notes (Signed)
Here for 2nd step tb test   Feeling well.  Given on left inner forearm

## 2016-09-19 ENCOUNTER — Ambulatory Visit: Payer: BLUE CROSS/BLUE SHIELD | Admitting: Physician Assistant

## 2016-09-19 DIAGNOSIS — Z111 Encounter for screening for respiratory tuberculosis: Secondary | ICD-10-CM

## 2016-09-19 NOTE — Progress Notes (Signed)
Immunization History  Administered Date(s) Administered  . PPD Test 09/09/2016, 09/16/2016  . Tdap 08/20/2016

## 2016-11-06 ENCOUNTER — Encounter: Payer: Self-pay | Admitting: Physician Assistant

## 2016-11-06 ENCOUNTER — Ambulatory Visit (INDEPENDENT_AMBULATORY_CARE_PROVIDER_SITE_OTHER): Payer: BLUE CROSS/BLUE SHIELD | Admitting: Physician Assistant

## 2016-11-06 VITALS — BP 138/94 | HR 65 | Temp 98.7°F | Resp 18 | Ht 70.0 in | Wt 171.8 lb

## 2016-11-06 DIAGNOSIS — N63 Unspecified lump in unspecified breast: Secondary | ICD-10-CM | POA: Diagnosis not present

## 2016-11-06 DIAGNOSIS — F4323 Adjustment disorder with mixed anxiety and depressed mood: Secondary | ICD-10-CM | POA: Diagnosis not present

## 2016-11-06 MED ORDER — DOXYCYCLINE HYCLATE 100 MG PO CAPS: 100.0000 mg | ORAL_CAPSULE | Freq: Two times a day (BID) | ORAL | 0 refills | Status: AC

## 2016-11-06 MED ORDER — SERTRALINE HCL 50 MG PO TABS: 50.0000 mg | ORAL_TABLET | Freq: Every day | ORAL | 3 refills | Status: DC

## 2016-11-06 MED ORDER — MELOXICAM 15 MG PO TABS: 15.0000 mg | ORAL_TABLET | Freq: Every day | ORAL | 1 refills | Status: DC

## 2016-11-06 NOTE — Progress Notes (Signed)
Patient ID: Catherine Ellison, female    DOB: November 05, 1975, 41 y.o.   MRN: [id number]  PCP: Dorise Hiss, PA-C  Chief Complaint  Patient presents with  . Breast Mass    x7 days,per pt painful, warm, "puffy"    Subjective:   Presents for evaluation of a painful lump in the LEFT breast.  She first noticed this lump in the shower about 1 week ago. The area feels "full" and "puffy." She has pain when the breast hangs without support. Acetaminophen without relief. Rates the pain 9/10. No previous breast pain like this previously.  She has a lump in each breast, previously examined by GYN (Dr. Tarri Glenn in [place], [place]) and reportedly benign, though she has had no imaging. Neither of these is painful.  She has bilateral nipple piercing x 3 years. No infections. The jewelry on the RIGHT has been removed.  No fever, chills. No nausea, vomiting. No redness of the skin or other skin changes.  She recently ended a long-time relationship and has had loss of appetite, nausea/dyspepsia. She relates that she has lost about 20 lbs in the past month and asks for something to increase her appetite. "I can't lose any more weight."  She is in school to become a Designer, jewellery. Her 43 year old lives independently. Her 19 (in college) and 26 year olds live at home.    Review of Systems As above.    There are no active problems to display for this patient.    Prior to Admission medications   Medication Sig Start Date End Date Taking? Authorizing Provider  dicyclomine (BENTYL) 10 MG capsule Take 10 mg by mouth 4 (four) times daily -  before meals and at bedtime.   Yes [provider]  Melatonin 3 MG TABS Take 1 tablet (3 mg total) by mouth at bedtime. 08/20/16  Yes McVey, Gelene Mink, PA-C  polyethylene glycol powder (GLYCOLAX/MIRALAX) powder Take 17 g by mouth 2 (two) times daily as needed. 08/20/16  Yes McVey, Gelene Mink, PA-C     No Known  Allergies     Objective:  Physical Exam  Constitutional: She is oriented to person, place, and time. She appears well-developed and well-nourished. She is active and cooperative. No distress.  BP (!) 138/94 (BP Location: Left Arm, Patient Position: Sitting, Cuff Size: Normal)   Pulse 65   Temp 98.7 F (37.1 C) (Oral)   Resp 18   Ht 5\' 10"  (1.778 m)   Wt 171 lb 12.8 oz (77.9 kg)   LMP 10/13/2016   SpO2 100%   BMI 24.65 kg/m   HENT:  Head: Normocephalic and atraumatic.  Right Ear: Hearing normal.  Left Ear: Hearing normal.  Eyes: Conjunctivae are normal. No scleral icterus.  Neck: Normal range of motion. Neck supple. No thyromegaly present.  Cardiovascular: Normal rate, regular rhythm and normal heart sounds.   Pulses:      Radial pulses are 2+ on the right side, and 2+ on the left side.  Pulmonary/Chest: Effort normal and breath sounds normal. Right breast exhibits no inverted nipple (piercing not present), no mass, no nipple discharge, no skin change and no tenderness. Left breast exhibits mass, skin change and tenderness. Left breast exhibits no inverted nipple (piercing) and no nipple discharge. Breasts are symmetrical.    Lymphadenopathy:       Head (right side): No tonsillar, no preauricular, no posterior auricular and no occipital adenopathy present.       Head (left  side): No tonsillar, no preauricular, no posterior auricular and no occipital adenopathy present.    She has no cervical adenopathy.       Right: No supraclavicular adenopathy present.       Left: No supraclavicular adenopathy present.  Neurological: She is alert and oriented to person, place, and time. No sensory deficit.  Skin: Skin is warm, dry and intact. No rash noted. No cyanosis or erythema. Nails show no clubbing.  Psychiatric: She has a normal mood and affect. Her speech is normal and behavior is normal.    Wt Readings from Last 3 Encounters:  11/06/16 171 lb 12.8 oz (77.9 kg)  09/09/16 179 lb 9.6  oz (81.5 kg)  08/20/16 182 lb 6.4 oz (82.7 kg)       Assessment & Plan:   Problem List Items Addressed This Visit    Breast lump - Primary    2 lumps. The tender lump may be mastitis, as she has a piercing in the nipple of that breast. Start doxycycline, meloxicam and warm compresses. Mammogram and [place]. The non-tender lump will be evaluated on these studies. Consider screening vs diagnostic mammogram on the RIGHT breast as well.      Relevant Medications   doxycycline (VIBRAMYCIN) 100 MG capsule   meloxicam (MOBIC) 15 MG tablet   Other Relevant Orders   MM DIAG BREAST TOMO UNI LEFT   US BREAST LTD UNI LEFT INC AXILLA   Adjustment disorder with mixed anxiety and depressed mood    Start SSRI.       Relevant Medications   sertraline (ZOLOFT) 50 MG tablet       Return in about 6 weeks (around 12/18/2016) for re-evaluation of mood.   Fara Chute, PA-C [facility]

## 2016-11-06 NOTE — Progress Notes (Signed)
Subjective:    Patient ID: Catherine Ellison, female    DOB: 09/11/1975, 41 y.o.   MRN: [id number] PCP: Patient, No Pcp Per Chief Complaint  Patient presents with  . Breast Mass    x7 days,per pt painful, warm, "puffy"    HPI: 41 y/o F presents to clinic complaining of a new lump in her left breast that is painful. She noticed it in the shower about a week ago. At first it was not painful, but later in the day it felt sore and has since become increasingly painful. States that it is about the size of a grape and has not changed in size since. The pain radiated around the lump and into her ribs on that side. 9/10 pain. States that there is no change in skin color or texture on breast. Denies changes in nipple or any discharge from nipple. Denies fevers, night sweats. 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 Denies history of cysts in her breasts, or breast cancer in her family. Does note that she has 2 other non-tender breast lumps on in her right breast and the other in left breast that have been evaluated by her GYN without needing imaging. She does not smoke. She had a bilateral salpingectomy in 2014 and left oophorectomy in 2006.   There are no active problems to display for this patient.  Past Medical History:  Diagnosis Date  . Allergy    seasonal   . Anemia   . Diabetes mellitus without complication ([place])    Gestational   Prior to Admission medications   Medication Sig Start Date End Date Taking? Authorizing Provider  dicyclomine (BENTYL) 10 MG capsule Take 10 mg by mouth 4 (four) times daily -  before meals and at bedtime.   Yes [provider]  Melatonin 3 MG TABS Take 1 tablet (3 mg total) by mouth at bedtime. 08/20/16  Yes McVey, Gelene Mink, PA-C  polyethylene glycol powder (GLYCOLAX/MIRALAX) powder Take 17 g by mouth 2 (two) times daily as  needed. 08/20/16  Yes McVey, Gelene Mink, PA-C  doxycycline (VIBRAMYCIN) 100 MG capsule Take 1 capsule (100 mg total) by mouth 2 (two) times daily. 11/06/16 11/16/16  Harrison Mons, PA-C  meloxicam (MOBIC) 15 MG tablet Take 1 tablet (15 mg total) by mouth daily. 11/06/16   Harrison Mons, PA-C  sertraline (ZOLOFT) 50 MG tablet Take 1 tablet (50 mg total) by mouth daily. 11/06/16   Harrison Mons, PA-C   No Known Allergies   Review of Systems     Objective:   Physical Exam  Constitutional: She appears well-developed and well-nourished. No distress.  BP (!) 138/94 (BP Location: Left Arm, Patient Position: Sitting, Cuff Size: Normal)   Pulse 65   Temp 98.7 F (37.1 C) (Oral)   Resp 18   Ht 5\' 10"  (1.778 m)   Wt 171 lb 12.8 oz (77.9 kg)   LMP 10/13/2016   SpO2 100%   BMI 24.65 kg/m    HENT:  Head: Normocephalic and atraumatic.  Eyes: Conjunctivae and EOM are normal. Pupils are equal, round, and reactive to light.  Neck: Normal range of motion. Neck supple. No thyromegaly present.  Cardiovascular: Normal rate, regular rhythm, normal heart sounds and intact distal pulses.   Pulmonary/Chest: Right breast exhibits mass (2 cm non tender firm round nodule about 12 oclock. ). Right  breast exhibits no inverted nipple, no nipple discharge, no skin change and no tenderness. Left breast exhibits mass (3 cm non tender firm round nodule about 11 oclock. 3 cm very tender, not well demarcated nodule about 3 oclock. ) and tenderness. Left breast exhibits no inverted nipple, no nipple discharge and no skin change. Breasts are symmetrical.    Nipple piercing noted on nipple of left breast.  Lymphadenopathy:    She has no cervical adenopathy.  Skin: Skin is warm and dry. She is not diaphoretic.  Psychiatric: She has a normal mood and affect. Her behavior is normal. Judgment and thought content normal.        Assessment & Plan:  1. Breast lump Patient has history of multiple non tendner  breast masses. New onset tender breast mass, more concerning for mastitis than for malignant lesion. Empirically treat for mastitis and associated inflammation with Doxycycline and Meloxicam.  - MM DIAG BREAST TOMO UNI LEFT; Future - US BREAST LTD UNI LEFT INC AXILLA; Future - doxycycline (VIBRAMYCIN) 100 MG capsule; Take 1 capsule (100 mg total) by mouth 2 (two) times daily.  Dispense: 20 capsule; Refill: 0 - meloxicam (MOBIC) 15 MG tablet; Take 1 tablet (15 mg total) by mouth daily.  Dispense: 30 tablet; Refill: 1  2. Adjustment disorder with mixed anxiety and depressed mood New stress in her life making her feel anxious and lack appetite. Start taking Zoloft to improve mood and appetite.  - sertraline (ZOLOFT) 50 MG tablet; Take 1 tablet (50 mg total) by mouth daily.  Dispense: 30 tablet; Refill: 3  Return in about 6 weeks (around 12/18/2016) for re-evaluation of mood.

## 2016-11-06 NOTE — Assessment & Plan Note (Signed)
2 lumps. The tender lump may be mastitis, as she has a piercing in the nipple of that breast. Start doxycycline, meloxicam and warm compresses. Mammogram and [place]. The non-tender lump will be evaluated on these studies. Consider screening vs diagnostic mammogram on the RIGHT breast as well.

## 2016-11-06 NOTE — Patient Instructions (Addendum)
When you start the sertraline (Zoloft), take 1/2 tablet each day for about 1 week, then increase to the whole tablet each day.  We recommend that you schedule a mammogram for breast cancer screening. Typically, you do not need a referral to do this. Please contact a local imaging center to schedule your mammogram.  [facility] - [phone]  *ask for the Radiology Department [facility] - [phone] or [phone]  [facility] - [phone] [place] [phone] [facility] - [phone]  *ask for the [facility] - [phone]  *ask for the Radiology Department [facility] - [phone]  *ask for the [place] - [phone]   IF you received an x-ray today, you will receive an invoice from [facility]. Please contact [facility] at [phone] with questions or concerns regarding your invoice.   IF you received labwork today, you will receive an invoice from [place]. Please contact [facility] at [phone] with questions or concerns regarding your invoice.   Our billing staff will not be able to assist you with questions regarding bills from these companies.  You will be contacted with the lab results as soon as they are available. The fastest way to get your results is to activate your My Chart account. Instructions are located on the last page of this paperwork. If you have not heard from [place] regarding the results in 2 weeks, please contact this office.     Mastitis Mastitis is inflammation of the breast tissue. It occurs most often in women who are breastfeeding, but it can also affect other women, and even sometimes men. What are the causes? Mastitis is usually caused by a bacterial infection. Bacteria enter the breast tissue through cuts or openings in the skin. Typically, this occurs with  breastfeeding because of cracked or irritated skin. Sometimes, it can occur even when there is no opening in the skin. It can be associated with plugged milk (lactiferous) ducts. Nipple piercing can also lead to mastitis. Also, some forms of breast cancer can cause mastitis. What are the signs or symptoms?  Swelling, redness, tenderness, and pain in an area of the breast.  Swelling of the glands under the arm on the same side.  Fever. If an infection is allowed to progress, a collection of pus (abscess) may develop. How is this diagnosed? Your health care provider can usually diagnose mastitis based on your symptoms and a physical exam. Tests may be done to help confirm the diagnosis. These may include:  Removal of pus from the breast by applying pressure to the area. This pus can be examined in the lab to determine which bacteria are present. If an abscess has developed, the fluid in the abscess can be removed with a needle. This can also be used to confirm the diagnosis and determine the bacteria present. In most cases, pus will not be present.  Blood tests to determine if your body is fighting a bacterial infection.  Mammogram or ultrasound tests to rule out other problems or diseases.  How is this treated? Antibiotic medicine is used to treat a bacterial infection. Your health care provider will determine which bacteria are most likely causing the infection and will select an appropriate antibiotic. This is sometimes changed based on the results of tests performed to identify the bacteria, or if there is no response to the antibiotic selected. Antibiotics are usually given by  mouth. You may also be given medicine for pain. Mastitis that occurs with breastfeeding will sometimes go away on its own, so your health care provider may choose to wait 24 hours after first seeing you to decide whether a prescription medicine is needed. Follow these instructions at home:  Only take over-the-counter  or prescription medicines for pain, fever, or discomfort as directed by your health care provider.  If your health care provider prescribed an antibiotic, take the medicine as directed. Make sure you finish it even if you start to feel better.  Do not wear a tight or underwire bra. Wear a soft, supportive bra.  Increase your fluid intake, especially if you have a fever.  Women who are breastfeeding should follow these instructions: ? Continue to empty the breast. Your health care provider can tell you whether this milk is safe for your infant or needs to be thrown out. You may be told to stop nursing until your health care provider thinks it is safe for your baby. Use a breast pump if you are advised to stop nursing. ? Keep your nipples clean and dry. ? Empty the first breast completely before going to the other breast. If your baby is not emptying your breasts completely for some reason, use a breast pump to empty your breasts. ? If you go back to work, pump your breasts while at work to stay in time with your nursing schedule. ? Avoid allowing your breasts to become overly filled with milk (engorged). Contact a health care provider if:  You have pus-like discharge from the breast.  Your symptoms do not improve with the treatment prescribed by your health care provider within 2 days. Get help right away if:  Your pain and swelling are getting worse.  You have pain that is not controlled with medicine.  You have a red line extending from the breast toward your armpit.  You have a fever or persistent symptoms for more than 2-3 days.  You have a fever and your symptoms suddenly get worse. This information is not intended to replace advice given to you by your health care provider. Make sure you discuss any questions you have with your health care provider. Document Released: 05/05/2005 Document Revised: 10/11/2015 Document Reviewed: 12/03/2012 Elsevier Interactive Patient Education   2017 [employer].

## 2016-11-06 NOTE — Assessment & Plan Note (Signed)
Start SSRI.

## 2016-11-26 ENCOUNTER — Other Ambulatory Visit: Payer: Self-pay | Admitting: Physician Assistant

## 2016-11-26 ENCOUNTER — Ambulatory Visit
Admission: RE | Admit: 2016-11-26 | Discharge: 2016-11-26 | Disposition: A | Discharge status: Home / Self Care | Payer: BLUE CROSS/BLUE SHIELD | Source: Ambulatory Visit | Attending: Physician Assistant | Admitting: Physician Assistant

## 2016-11-26 ENCOUNTER — Telehealth: Payer: Self-pay | Admitting: Physician Assistant

## 2016-11-26 DIAGNOSIS — N63 Unspecified lump in unspecified breast: Secondary | ICD-10-CM

## 2016-11-26 DIAGNOSIS — N6002 Solitary cyst of left breast: Secondary | ICD-10-CM

## 2016-11-26 NOTE — Telephone Encounter (Signed)
THIS MESSAGE IS TO CHELLE FROM [facility]: PATIENT HAD A MAMMOGRAM TODAY AND THEIR WAS A PLACE ON HER (R) BREAST. THEY WOULD LIKE TO GET A VERBAL ORDER TO DO A ULTRASOUND SINCE THE PATIENT IS ALREADY THERE. BEST PHONE [phone] EXT. [phone] ([place]) I WAS ABLE TO GET JILL AT THE NURSES STATION AND SHE AUTHORIZED THE PATIENT TO GO AHEAD AN GET THE ULTRASOUND. [place]

## 2016-12-04 ENCOUNTER — Ambulatory Visit
Admission: RE | Admit: 2016-12-04 | Discharge: 2016-12-04 | Disposition: A | Discharge status: Home / Self Care | Payer: BLUE CROSS/BLUE SHIELD | Source: Ambulatory Visit | Attending: Physician Assistant | Admitting: Physician Assistant

## 2016-12-04 DIAGNOSIS — N6002 Solitary cyst of left breast: Secondary | ICD-10-CM

## 2017-01-06 ENCOUNTER — Ambulatory Visit: Payer: BLUE CROSS/BLUE SHIELD | Admitting: Physician Assistant

## 2017-08-21 ENCOUNTER — Other Ambulatory Visit: Payer: Self-pay | Admitting: *Deleted

## 2017-08-21 DIAGNOSIS — K5900 Constipation, unspecified: Secondary | ICD-10-CM

## 2017-08-21 DIAGNOSIS — G479 Sleep disorder, unspecified: Secondary | ICD-10-CM

## 2017-08-21 MED ORDER — MELATONIN 3 MG PO TABS: 3.0000 mg | ORAL_TABLET | Freq: Every day | ORAL | 0 refills | Status: DC

## 2017-08-21 MED ORDER — POLYETHYLENE GLYCOL 3350 17 GM/SCOOP PO POWD: 17.0000 g | Freq: Two times a day (BID) | ORAL | 0 refills | Status: DC | PRN

## 2019-04-22 ENCOUNTER — Ambulatory Visit (HOSPITAL_COMMUNITY)
Admission: EM | Admit: 2019-04-22 | Discharge: 2019-04-22 | Disposition: A | Discharge status: Home / Self Care | Payer: BC Managed Care – PPO | Attending: Family Medicine | Admitting: Family Medicine

## 2019-04-22 ENCOUNTER — Other Ambulatory Visit: Payer: Self-pay

## 2019-04-22 ENCOUNTER — Encounter (HOSPITAL_COMMUNITY): Payer: Self-pay | Admitting: Family Medicine

## 2019-04-22 DIAGNOSIS — N719 Inflammatory disease of uterus, unspecified: Secondary | ICD-10-CM | POA: Diagnosis present

## 2019-04-22 LAB — POCT URINALYSIS DIP (DEVICE)
Bilirubin Urine: NEGATIVE
Glucose, UA: NEGATIVE mg/dL
Hgb urine dipstick: NEGATIVE
Ketones, ur: NEGATIVE mg/dL
Leukocytes,Ua: NEGATIVE
Nitrite: NEGATIVE
Protein, ur: NEGATIVE mg/dL
Specific Gravity, Urine: >=1.03 (ref 1.005–1.030)
Urobilinogen, UA: 0.2 mg/dL (ref 0.0–1.0)
pH: 5.5 (ref 5.0–8.0)

## 2019-04-22 MED ORDER — METRONIDAZOLE 500 MG PO TABS: 500.0000 mg | ORAL_TABLET | Freq: Two times a day (BID) | ORAL | 0 refills | Status: DC

## 2019-04-22 MED ORDER — CIPROFLOXACIN HCL 500 MG PO TABS: 500.0000 mg | ORAL_TABLET | Freq: Two times a day (BID) | ORAL | 0 refills | Status: DC

## 2019-04-22 NOTE — Discharge Instructions (Signed)
U/A is normal today

## 2019-04-22 NOTE — ED Triage Notes (Signed)
Pt presents to UC w/ c/o lower abd pain, lower back pain, vaginal bleeding and spotting for 2 months. Pt states pain and bleeding wean on and off. Pt also states she has a foul smell from the bleeding. Denies burning or itching.

## 2019-04-22 NOTE — ED Provider Notes (Signed)
[place]    CSN: [id number] Arrival date & time: 04/22/19  1230      History   Chief Complaint Chief Complaint  Patient presents with   vaginal bleeding, low abd/ back pain    HPI Catherine Ellison is a 43 y.o. female.   Initial [facility] visit for this 43 yo woman with vaginal bleeding and back pain.  (One month ago she was given two different narcotic Rx by dentist)  Two months of spotting, lower abdominal cramping.  She's had rare intercourse with same partner.  Notes watery discharge. Notes intermittent chills and sweats at night.     Past Medical History:  Diagnosis Date   Allergy    seasonal    Anemia    Diabetes mellitus without complication [facility])    Gestational    Patient Active Problem List   Diagnosis Date Noted   Breast lump 11/06/2016   Adjustment disorder with mixed anxiety and depressed mood 11/06/2016    Past Surgical History:  Procedure Laterality Date   BILATERAL SALPINGECTOMY Bilateral 2014   emergency   LEFT OOPHORECTOMY Left 2006    OB History   No obstetric history on file.      Home Medications    Prior to Admission medications   Medication Sig Start Date End Date Taking? Authorizing Provider  ciprofloxacin (CIPRO) 500 MG tablet Take 1 tablet (500 mg total) by mouth 2 (two) times daily. 04/22/19   Robyn Haber, MD  dicyclomine (BENTYL) 10 MG capsule Take 10 mg by mouth 4 (four) times daily -  before meals and at bedtime.    [provider]  Melatonin 3 MG TABS Take 1 tablet (3 mg total) by mouth at bedtime. 08/21/17   McVey, Gelene Mink, PA-C  metroNIDAZOLE (FLAGYL) 500 MG tablet Take 1 tablet (500 mg total) by mouth 2 (two) times daily. 04/22/19   Robyn Haber, MD  polyethylene glycol powder (GLYCOLAX/MIRALAX) powder Take 17 g by mouth 2 (two) times daily as needed. 08/21/17   McVey, Gelene Mink, PA-C  sertraline (ZOLOFT) 50 MG tablet Take 1 tablet (50 mg total) by mouth daily. 11/06/16  04/22/19  Harrison Mons, PA    Family History Family History  Problem Relation Age of Onset   Diabetes Mother    Hypertension Mother    Diabetes Father    Heart disease Father        4V CABG 03/2016, age 108   Hyperlipidemia Father    Hypertension Father    Diabetes Sister    Hypertension Sister    Hypertension Maternal Grandmother    Cancer Maternal Grandmother        colon   Hypertension Maternal Grandfather    Hypertension Paternal Grandmother    Hypertension Paternal Grandfather    Cancer Paternal Grandfather        lung    Social History Social History   Tobacco Use   Smoking status: Never Smoker   Smokeless tobacco: Never Used  Substance Use Topics   Alcohol use: Yes    Alcohol/week: 5.0 standard drinks    Types: 2 Standard drinks or equivalent, 1 Shots of liquor, 1 Cans of beer, 1 Glasses of wine per week   Drug use: No     Allergies   Patient has no known allergies.   Review of Systems Review of Systems  Constitutional: Positive for chills and diaphoresis.  Gastrointestinal: Positive for abdominal pain.  Genitourinary: Positive for menstrual problem, pelvic pain and vaginal discharge.  All other systems reviewed and are negative.    Physical Exam Triage Vital Signs ED Triage Vitals [04/22/19 1250]  Enc Vitals Group     BP 127/85     Pulse Rate 75     Resp 16     Temp 98.6 F (37 C)     Temp Source Oral     SpO2 100 %     Weight      Height      Head Circumference      Peak Flow      Pain Score      Pain Loc      Pain Edu?      Excl. in [place]?    No data found.  Updated Vital Signs BP 127/85 (BP Location: Left Arm)    Pulse 75    Temp 98.6 F (37 C) (Oral)    Resp 16    LMP 02/19/2019 (Approximate)    SpO2 100%    Physical Exam Vitals signs and nursing note reviewed.  Constitutional:      General: She is not in acute distress.    Appearance: Normal appearance. She is normal weight.  HENT:     Head:  Normocephalic.  Eyes:     Conjunctiva/sclera: Conjunctivae normal.  Neck:     Musculoskeletal: Normal range of motion and neck supple.  Cardiovascular:     Rate and Rhythm: Normal rate.  Pulmonary:     Effort: Pulmonary effort is normal.  Abdominal:     Tenderness: There is abdominal tenderness. There is guarding. There is no rebound.     Comments: Diffuse mild tenderness with deep palpation  Musculoskeletal: Normal range of motion.  Skin:    General: Skin is warm and dry.  Neurological:     General: No focal deficit present.     Mental Status: She is alert.  Psychiatric:        Mood and Affect: Mood normal.        Behavior: Behavior normal.        Thought Content: Thought content normal.      UC Treatments / Results  Labs (all labs ordered are listed, but only abnormal results are displayed) Labs Reviewed  POCT URINALYSIS DIP (DEVICE)  CERVICOVAGINAL ANCILLARY ONLY    EKG   Radiology No results found.  Procedures Procedures (including critical care time)  Medications Ordered in UC Medications - No data to display  Initial Impression / Assessment and Plan / UC Course  I have reviewed the triage vital signs and the nursing notes.  Pertinent labs & imaging results that were available during my care of the patient were reviewed by me and considered in my medical decision making (see chart for details).    Final Clinical Impressions(s) / UC Diagnoses   Final diagnoses:  Endometritis     Discharge Instructions     U/A is normal today    ED Prescriptions    Medication Sig Dispense Auth. Provider   ciprofloxacin (CIPRO) 500 MG tablet Take 1 tablet (500 mg total) by mouth 2 (two) times daily. 14 tablet Robyn Haber, MD   metroNIDAZOLE (FLAGYL) 500 MG tablet Take 1 tablet (500 mg total) by mouth 2 (two) times daily. 14 tablet Robyn Haber, MD     I have reviewed the PDMP during this encounter.   Robyn Haber, MD 04/22/19 1316

## 2019-04-26 LAB — CERVICOVAGINAL ANCILLARY ONLY
Bacterial vaginitis: POSITIVE — AB
Candida vaginitis: NEGATIVE
Chlamydia: NEGATIVE
Neisseria Gonorrhea: NEGATIVE
Trichomonas: NEGATIVE

## 2019-06-30 ENCOUNTER — Ambulatory Visit (INDEPENDENT_AMBULATORY_CARE_PROVIDER_SITE_OTHER): Payer: BC Managed Care – PPO | Admitting: Adult Health Nurse Practitioner

## 2019-06-30 ENCOUNTER — Other Ambulatory Visit (HOSPITAL_COMMUNITY)
Admission: RE | Admit: 2019-06-30 | Discharge: 2019-06-30 | Disposition: A | Discharge status: Home / Self Care | Payer: BC Managed Care – PPO | Source: Ambulatory Visit | Attending: Adult Health Nurse Practitioner | Admitting: Adult Health Nurse Practitioner

## 2019-06-30 ENCOUNTER — Encounter: Payer: Self-pay | Admitting: Adult Health Nurse Practitioner

## 2019-06-30 ENCOUNTER — Other Ambulatory Visit: Payer: Self-pay

## 2019-06-30 VITALS — BP 137/86 | HR 85 | Temp 98.3°F | Ht 71.0 in | Wt 183.0 lb

## 2019-06-30 DIAGNOSIS — N920 Excessive and frequent menstruation with regular cycle: Secondary | ICD-10-CM | POA: Diagnosis present

## 2019-06-30 DIAGNOSIS — T192XXA Foreign body in vulva and vagina, initial encounter: Secondary | ICD-10-CM | POA: Insufficient documentation

## 2019-06-30 DIAGNOSIS — Z124 Encounter for screening for malignant neoplasm of cervix: Secondary | ICD-10-CM

## 2019-06-30 DIAGNOSIS — N939 Abnormal uterine and vaginal bleeding, unspecified: Secondary | ICD-10-CM | POA: Diagnosis present

## 2019-06-30 DIAGNOSIS — T192XXS Foreign body in vulva and vagina, sequela: Secondary | ICD-10-CM

## 2019-06-30 LAB — POCT WET + KOH PREP
Trich by wet prep: ABSENT
Yeast by KOH: ABSENT
Yeast by wet prep: ABSENT

## 2019-06-30 LAB — POCT URINALYSIS DIP (MANUAL ENTRY)
Bilirubin, UA: NEGATIVE
Blood, UA: NEGATIVE
Glucose, UA: NEGATIVE mg/dL
Ketones, POC UA: NEGATIVE mg/dL
Nitrite, UA: NEGATIVE
Protein Ur, POC: NEGATIVE mg/dL
Spec Grav, UA: >=1.03 — AB (ref 1.010–1.025)
Urobilinogen, UA: 0.2 E.U./dL
pH, UA: 6 (ref 5.0–8.0)

## 2019-06-30 NOTE — Progress Notes (Signed)
SUBJECTIVE:  44 y.o. female for increased vaginal bleeding, spotting, and odor.    Reports since September she has been having spotting and haeavy bleeding with her periods.  Feeling fatigued.  She is concerned it might be a retained tampon.  Recently had G/C, trich, BV, and yeast labs from Urgent Care which only came back positive for BV.  Symptoms persisted.    Current Outpatient Medications  Medication Sig Dispense Refill  . dicyclomine (BENTYL) 10 MG capsule Take 10 mg by mouth 4 (four) times daily -  before meals and at bedtime.    Marland Kitchen lubiprostone (AMITIZA) 24 MCG capsule Take 24 mcg by mouth 2 (two) times daily with a meal.    . Melatonin 3 MG TABS Take 1 tablet (3 mg total) by mouth at bedtime. 60 tablet 0  . polyethylene glycol powder (GLYCOLAX/MIRALAX) powder Take 17 g by mouth 2 (two) times daily as needed. 578 g 0  . ciprofloxacin (CIPRO) 500 MG tablet Take 1 tablet (500 mg total) by mouth 2 (two) times daily. (Patient not taking: Reported on 06/30/2019) 14 tablet 0  . metroNIDAZOLE (FLAGYL) 500 MG tablet Take 1 tablet (500 mg total) by mouth 2 (two) times daily. (Patient not taking: Reported on 06/30/2019) 14 tablet 0   No current facility-administered medications for this visit.   Allergies: Patient has no known allergies.  Patient's last menstrual period was 06/16/2019.  ROS:  No dyspnea or chest pain on exertion.  No abdominal pain, change in bowel habits, black or bloody stools.  No urinary tract symptoms. GYN ROS: no breast pain or new or enlarging lumps on self exam, she complains of vaginal bleeding, spotting, and an odor . No neurological complaints.  OBJECTIVE:  The patient appears well, alert, oriented x 3, in no distress. BP 137/86 (BP Location: Left Arm, Patient Position: Sitting, Cuff Size: Normal)   Pulse 85   Temp 98.3 F (36.8 C) (Temporal)   Ht 5\' 11"  (1.803 m)   Wt 183 lb (83 kg)   LMP 06/16/2019   SpO2 97%   BMI 25.52 kg/m  ENT normal.  Neck supple. No  adenopathy or thyromegaly. PERLA. Lungs are clear, good air entry, no wheezes, rhonchi or rales. S1 and S2 normal, no murmurs, regular rate and rhythm. Abdomen soft without tenderness, guarding, mass or organomegaly. Extremities show no edema, normal peripheral pulses. Neurological is normal, no focal findings.    PELVIC EXAM: VULVA: normal appearing vulva with no masses, tenderness or lesions, VAGINA: retained tampon in vaginal canal with malodorous discharge.  Removed with forceps , CERVIX: normal appearing cervix without discharge or lesions  ASSESSMENT:  1. Retained tampon, sequela   2. Vaginal bleeding   3. Menorrhagia with regular cycle   4. Cervical cancer screening      PLAN:  Orders Placed This Encounter  Procedures  . CBC with Differential/Platelet  . Iron, TIBC and Ferritin Panel  . TSH  . POCT Wet + KOH Prep  . POCT urinalysis dipstick   Will f/u after labs completed.  Patient to call or return if symptoms persist or worsen.  She is inline with this plan.   Glyn Ade, NP

## 2019-07-01 ENCOUNTER — Other Ambulatory Visit: Payer: Self-pay | Admitting: Adult Health Nurse Practitioner

## 2019-07-01 LAB — CBC WITH DIFFERENTIAL/PLATELET
Basophils Absolute: 0.1 10*3/uL (ref 0.0–0.2)
Basos: 1 %
EOS (ABSOLUTE): 0.1 10*3/uL (ref 0.0–0.4)
Eos: 1 %
Hematocrit: 30.8 % — ABNORMAL LOW (ref 34.0–46.6)
Hemoglobin: 9.5 g/dL — ABNORMAL LOW (ref 11.1–15.9)
Immature Grans (Abs): 0 10*3/uL (ref 0.0–0.1)
Immature Granulocytes: 0 %
Lymphocytes Absolute: 3.4 10*3/uL — ABNORMAL HIGH (ref 0.7–3.1)
Lymphs: 37 %
MCH: 22 pg — ABNORMAL LOW (ref 26.6–33.0)
MCHC: 30.8 g/dL — ABNORMAL LOW (ref 31.5–35.7)
MCV: 72 fL — ABNORMAL LOW (ref 79–97)
Monocytes Absolute: 0.7 10*3/uL (ref 0.1–0.9)
Monocytes: 7 %
Neutrophils Absolute: 4.9 10*3/uL (ref 1.4–7.0)
Neutrophils: 54 %
Platelets: 389 10*3/uL (ref 150–450)
RBC: 4.31 x10E6/uL (ref 3.77–5.28)
RDW: 16.8 % — ABNORMAL HIGH (ref 11.7–15.4)
WBC: 9.1 10*3/uL (ref 3.4–10.8)

## 2019-07-01 LAB — GC/CHLAMYDIA PROBE AMP (~~LOC~~) NOT AT ARMC
Chlamydia: NEGATIVE
Comment: NEGATIVE
Comment: NORMAL
Neisseria Gonorrhea: NEGATIVE

## 2019-07-01 LAB — IRON,TIBC AND FERRITIN PANEL
Ferritin: 18 ng/mL (ref 15–150)
Iron Saturation: 4 % — CL (ref 15–55)
Iron: 16 ug/dL — ABNORMAL LOW (ref 27–159)
Total Iron Binding Capacity: 385 ug/dL (ref 250–450)
UIBC: 369 ug/dL (ref 131–425)

## 2019-07-01 LAB — TSH: TSH: 1.56 u[IU]/mL (ref 0.450–4.500)

## 2019-07-01 MED ORDER — NITROFURANTOIN MONOHYD MACRO 100 MG PO CAPS: 100.0000 mg | ORAL_CAPSULE | Freq: Two times a day (BID) | ORAL | 0 refills | Status: AC

## 2019-07-01 MED ORDER — METRONIDAZOLE 500 MG PO TABS: 500.0000 mg | ORAL_TABLET | Freq: Three times a day (TID) | ORAL | 0 refills | Status: DC

## 2019-07-04 LAB — CYTOLOGY - PAP
Adequacy: ABSENT
Comment: NEGATIVE
Comment: NEGATIVE
Diagnosis: UNDETERMINED — AB
High risk HPV: NEGATIVE
Trichomonas: NEGATIVE

## 2020-05-04 ENCOUNTER — Other Ambulatory Visit: Payer: Self-pay | Admitting: Family Medicine

## 2020-05-04 ENCOUNTER — Other Ambulatory Visit: Payer: Self-pay

## 2020-05-04 ENCOUNTER — Ambulatory Visit: Payer: Self-pay

## 2020-05-04 DIAGNOSIS — M79672 Pain in left foot: Secondary | ICD-10-CM

## 2020-05-16 ENCOUNTER — Ambulatory Visit
Admission: RE | Admit: 2020-05-16 | Discharge: 2020-05-16 | Disposition: A | Discharge status: Home / Self Care | Payer: Worker's Compensation | Source: Ambulatory Visit | Attending: Family Medicine | Admitting: Family Medicine

## 2020-05-16 ENCOUNTER — Other Ambulatory Visit: Payer: Self-pay | Admitting: Family Medicine

## 2020-05-16 DIAGNOSIS — G8929 Other chronic pain: Secondary | ICD-10-CM

## 2020-05-16 DIAGNOSIS — M79672 Pain in left foot: Secondary | ICD-10-CM

## 2020-12-27 ENCOUNTER — Other Ambulatory Visit: Payer: Self-pay | Admitting: Physician Assistant

## 2020-12-27 DIAGNOSIS — Z1231 Encounter for screening mammogram for malignant neoplasm of breast: Secondary | ICD-10-CM

## 2021-01-16 ENCOUNTER — Other Ambulatory Visit: Payer: Self-pay | Admitting: Family Medicine

## 2021-01-16 DIAGNOSIS — N6012 Diffuse cystic mastopathy of left breast: Secondary | ICD-10-CM

## 2021-01-16 DIAGNOSIS — N61 Mastitis without abscess: Secondary | ICD-10-CM

## 2021-01-30 ENCOUNTER — Ambulatory Visit
Admission: RE | Admit: 2021-01-30 | Discharge: 2021-01-30 | Disposition: A | Discharge status: Home / Self Care | Payer: BC Managed Care – PPO | Source: Ambulatory Visit | Attending: Family Medicine | Admitting: Family Medicine

## 2021-01-30 ENCOUNTER — Other Ambulatory Visit: Payer: Self-pay | Admitting: Family Medicine

## 2021-01-30 ENCOUNTER — Ambulatory Visit: Payer: BC Managed Care – PPO

## 2021-01-30 ENCOUNTER — Other Ambulatory Visit: Payer: Self-pay

## 2021-01-30 ENCOUNTER — Other Ambulatory Visit: Payer: Self-pay | Admitting: Radiology

## 2021-01-30 DIAGNOSIS — N61 Mastitis without abscess: Secondary | ICD-10-CM

## 2021-01-30 DIAGNOSIS — N6310 Unspecified lump in the right breast, unspecified quadrant: Secondary | ICD-10-CM

## 2021-02-01 ENCOUNTER — Other Ambulatory Visit: Payer: Self-pay | Admitting: Family Medicine

## 2021-02-01 DIAGNOSIS — N611 Abscess of the breast and nipple: Secondary | ICD-10-CM

## 2021-02-04 LAB — AEROBIC/ANAEROBIC CULTURE W GRAM STAIN (SURGICAL/DEEP WOUND)
Culture: NO GROWTH
Special Requests: NORMAL

## 2021-02-07 ENCOUNTER — Ambulatory Visit
Admission: RE | Admit: 2021-02-07 | Discharge: 2021-02-07 | Disposition: A | Discharge status: Home / Self Care | Payer: BC Managed Care – PPO | Source: Ambulatory Visit | Attending: Family Medicine | Admitting: Family Medicine

## 2021-02-07 ENCOUNTER — Other Ambulatory Visit: Payer: Self-pay

## 2021-02-07 ENCOUNTER — Other Ambulatory Visit: Payer: Self-pay | Admitting: Family Medicine

## 2021-02-07 DIAGNOSIS — N6320 Unspecified lump in the left breast, unspecified quadrant: Secondary | ICD-10-CM

## 2021-02-07 DIAGNOSIS — N611 Abscess of the breast and nipple: Secondary | ICD-10-CM

## 2021-02-07 HISTORY — PX: BREAST BIOPSY: SHX20

## 2021-02-08 ENCOUNTER — Telehealth: Payer: Self-pay | Admitting: Oncology

## 2021-02-08 ENCOUNTER — Other Ambulatory Visit: Payer: Self-pay | Admitting: Family Medicine

## 2021-02-08 DIAGNOSIS — C50912 Malignant neoplasm of unspecified site of left female breast: Secondary | ICD-10-CM

## 2021-02-08 NOTE — Telephone Encounter (Signed)
Spoke to patient to confirm morning clinic appointment for 9/28, sent paperwork email

## 2021-02-11 ENCOUNTER — Encounter: Payer: Self-pay | Admitting: *Deleted

## 2021-02-11 ENCOUNTER — Other Ambulatory Visit: Payer: Self-pay | Admitting: *Deleted

## 2021-02-11 DIAGNOSIS — Z170 Estrogen receptor positive status [ER+]: Secondary | ICD-10-CM | POA: Insufficient documentation

## 2021-02-11 DIAGNOSIS — C50412 Malignant neoplasm of upper-outer quadrant of left female breast: Secondary | ICD-10-CM | POA: Insufficient documentation

## 2021-02-12 ENCOUNTER — Ambulatory Visit
Admission: RE | Admit: 2021-02-12 | Discharge: 2021-02-12 | Disposition: A | Discharge status: Home / Self Care | Payer: BC Managed Care – PPO | Source: Ambulatory Visit | Attending: Oncology | Admitting: Oncology

## 2021-02-12 ENCOUNTER — Other Ambulatory Visit: Payer: Self-pay

## 2021-02-12 ENCOUNTER — Other Ambulatory Visit: Payer: Self-pay | Admitting: Oncology

## 2021-02-12 ENCOUNTER — Ambulatory Visit
Admission: RE | Admit: 2021-02-12 | Discharge: 2021-02-12 | Disposition: A | Discharge status: Home / Self Care | Payer: BC Managed Care – PPO | Source: Ambulatory Visit | Attending: Family Medicine | Admitting: Family Medicine

## 2021-02-12 DIAGNOSIS — C50912 Malignant neoplasm of unspecified site of left female breast: Secondary | ICD-10-CM

## 2021-02-12 DIAGNOSIS — Z170 Estrogen receptor positive status [ER+]: Secondary | ICD-10-CM

## 2021-02-12 NOTE — Progress Notes (Signed)
Radiation Oncology         [phone] ________________________________  Initial Outpatient Consultation  Name: Catherine Ellison MRN: [id number]  Date: 02/13/2021  DOB: 06/28/1975  CC:Byrd, Sarah Arnette, NP  Blackman, Douglas, MD   REFERRING PHYSICIAN: Blackman, Douglas, MD  DIAGNOSIS:    ICD-10-CM   1. Malignant neoplasm of upper-outer quadrant of left breast in female, estrogen receptor positive (HCC)  C50.412    Z17.0      Cancer Staging Malignant neoplasm of upper-outer quadrant of left breast in female, estrogen receptor positive (HCC) Staging form: Breast, AJCC 8th Edition - Clinical stage from 02/13/2021: Stage IIIA (cT2, cN1, cM0, G3, ER+, PR-, HER2-) - Unsigned Stage prefix: Initial diagnosis Histologic grading system: 3 grade system Laterality: Left Staged by: Pathologist and managing physician Stage used in treatment planning: Yes National guidelines used in treatment planning: Yes Type of national guideline used in treatment planning: NCCN   CHIEF COMPLAINT: Here to discuss management of left breast cancer  HISTORY OF PRESENT ILLNESS::Catherine Ellison is a 44 y.o. female who presented for bilateral diagnostic mammogram and left breast ultrasound on 01/30/21 which revealed an indeterminate mass in the left breast at 2 o'clock, 5cmfn, measuring 4.1 cm in the greatest dimension (noted as likely solid). An irregular solid mass in the right breast at 3 o'clock, 1cmfn was also appreciated on mammogram . Symptoms, if any, at that time, were: left breast pain, erythema, palpable warmth, and skin thickening (treated with 10 days of augmentin which significantly improved symptoms however they still remained). Three abnormal nodes in the left axilla were also visualized.  Left breast biopsy at the 2 o'clock position on date of 02/07/21 showed invasive ductal carcinoma measuring 1.2 cm in the greatest tumor dimension.  ER status: 70%, positive, with weak staining  intensity; PR status negative, Her2 status negative; Grade 3.   Left axillary lymph node biopsy collected on 02/12/21 showed metastatic carcinoma involving nodal tissue.  Right breast biopsy also performed on 01/30/21 revealed predominantly fatty tissue with focal benign breast tissue. (No distinct lesion identified).  She is completing school to receive her RN degree.  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  She has already received job offers.  She has met with medical oncology and plans on receiving neoadjuvant chemotherapy with breast conserving surgery to follow if feasible.  PREVIOUS RADIATION THERAPY: No  PAST MEDICAL HISTORY:  has a past medical history of Allergy, Anemia, Diabetes mellitus without complication (HCC), and IBS (irritable bowel syndrome).    PAST SURGICAL HISTORY: Past Surgical History:  Procedure Laterality Date   BILATERAL SALPINGECTOMY Bilateral 2014   emergency   LEFT OOPHORECTOMY Left 2006    FAMILY HISTORY: family history includes Breast cancer in her maternal aunt; Colon cancer in her maternal grandmother; Diabetes in her father, mother, and sister; Heart disease in her father; Hyperlipidemia in her father; Hypertension in her father, maternal grandfather, maternal grandmother, mother, paternal grandfather, paternal grandmother, and sister; Lung cancer in her paternal grandfather.  SOCIAL HISTORY:  reports that she has never smoked. She has never used smokeless tobacco. She reports current alcohol use of about 5.0 standard drinks per week. She reports that she does not use drugs.  ALLERGIES: Patient has no known allergies.  MEDICATIONS:  Current Outpatient Medications  Medication Sig Dispense Refill   acetaminophen (TYLENOL) 325 MG tablet Take 650 mg by mouth every 6 (six) hours as needed.     ciprofloxacin (CIPRO) 500 MG tablet Take 1   tablet (500 mg total) by mouth 2 (two) times daily. (Patient not taking: No sig reported) 14 tablet 0   dicyclomine  (BENTYL) 10 MG capsule Take 10 mg by mouth 4 (four) times daily -  before meals and at bedtime. (Patient not taking: Reported on 02/13/2021)     ibuprofen (ADVIL) 800 MG tablet Take by mouth.     LINACLOTIDE PO Take 72 mcg by mouth every other day.     lubiprostone (AMITIZA) 24 MCG capsule Take 24 mcg by mouth 2 (two) times daily with a meal. (Patient not taking: Reported on 02/13/2021)     Melatonin 3 MG TABS Take 1 tablet (3 mg total) by mouth at bedtime. (Patient not taking: Reported on 02/13/2021) 60 tablet 0   metroNIDAZOLE (FLAGYL) 500 MG tablet Take 1 tablet (500 mg total) by mouth 3 (three) times daily. (Patient not taking: Reported on 02/13/2021) 21 tablet 0   polyethylene glycol powder (GLYCOLAX/MIRALAX) powder Take 17 g by mouth 2 (two) times daily as needed. (Patient not taking: Reported on 02/13/2021) 578 g 0   No current facility-administered medications for this encounter.    REVIEW OF SYSTEMS: As above in HPI.   PHYSICAL EXAM:  vitals were not taken for this visit.   General: Alert and oriented, in no acute distress HEENT: Head is normocephalic.   Heart: Regular in rate and rhythm with no murmurs, rubs, or gallops. Chest: Clear to auscultation bilaterally, with no rhonchi, wheezes, or rales. Skin: I do not appreciate any significant erythema or thickening of the skin over the breasts Musculoskeletal: symmetric strength and muscle tone throughout. Neurologic:   No obvious focalities. Speech is fluent. Coordination is intact. Psychiatric: Judgment and insight are intact. Affect is appropriate. Breasts: upper outer quadrant of the left breast notable for a 5 cm mass and there is also firmness in the axilla.  Both areas tender to palpation. No other palpable masses appreciated in the breasts or axilla.  Skin without any significant thickening or erythema  ECOG = 0  0 - Asymptomatic (Fully active, able to carry on all predisease activities without restriction)  1 - Symptomatic but  completely ambulatory (Restricted in physically strenuous activity but ambulatory and able to carry out work of a light or sedentary nature. For example, light housework, office work)  2 - Symptomatic, <50% in bed during the day (Ambulatory and capable of all self care but unable to carry out any work activities. Up and about more than 50% of waking hours)  3 - Symptomatic, >50% in bed, but not bedbound (Capable of only limited self-care, confined to bed or chair 50% or more of waking hours)  4 - Bedbound (Completely disabled. Cannot carry on any self-care. Totally confined to bed or chair)  5 - Death   Oken MM, Creech RH, Tormey DC, et al. (1982). "Toxicity and response criteria of the Eastern Cooperative Oncology Group". Am. J. Clin. Oncol. 5 (6): 649-55   LABORATORY DATA:  Lab Results  Component Value Date   WBC 8.0 02/13/2021   HGB 8.7 (L) 02/13/2021   HCT 29.1 (L) 02/13/2021   MCV 71.1 (L) 02/13/2021   PLT 401 (H) 02/13/2021   CMP     Component Value Date/Time   NA 138 02/13/2021 0830   NA 138 08/20/2016 1712   K 4.0 02/13/2021 0830   CL 106 02/13/2021 0830   CO2 23 02/13/2021 0830   GLUCOSE 96 02/13/2021 0830   BUN 14 02/13/2021 0830   BUN   11 08/20/2016 1712   CREATININE 0.88 02/13/2021 0830   CALCIUM 9.1 02/13/2021 0830   PROT 7.0 02/13/2021 0830   PROT 6.9 08/20/2016 1712   ALBUMIN 3.5 02/13/2021 0830   ALBUMIN 4.0 08/20/2016 1712   AST 19 02/13/2021 0830   ALT 12 02/13/2021 0830   ALKPHOS 93 02/13/2021 0830   BILITOT 0.3 02/13/2021 0830   GFRNONAA >60 02/13/2021 0830   GFRAA 88 08/20/2016 1712         RADIOGRAPHY: US BREAST LTD UNI LEFT INC AXILLA  Result Date: 02/07/2021 CLINICAL DATA:  44-year-old female presenting for 1 week follow-up of a probable left breast abscess. At the last appointment aspiration of the abscess was performed and patient was placed on a second course of antibiotics. Unfortunately the patient has been unable to tolerate the  doxycycline (vomiting). She reports persistent lump and pain in the left breast. EXAM: ULTRASOUND OF THE LEFT BREAST COMPARISON:  Previous exam(s). FINDINGS: On physical exam, I feel a discrete mass in the upper outer left breast with associated warmth and erythema. Targeted ultrasound is performed in the left breast at 2 o'clock 5 cm from the nipple demonstrating an irregular heterogeneous mass measuring 3.9 x 3.7 by 3.8 cm, previously measuring 4.1 x 3.5 x 3.4 cm. IMPRESSION: Persistent heterogeneous mass in the left breast at 2 o'clock which may represent an abscess. RECOMMENDATION: Attempt left breast aspiration today. If the mass can not be aspirated recommend core needle sampling. I have discussed the findings and recommendations with the patient. BI-RADS CATEGORY  3: Probably benign. Electronically Signed   By: Nancy  Ballantyne M.D.   On: 02/07/2021 14:54  US BREAST LTD UNI LEFT INC AXILLA  Result Date: 01/30/2021 CLINICAL DATA:  The patient recently had pain, erythema, warmth to the touch, and skin thickening on the left thought to represent infection. The patient was treated with 10 days of Augmentin with significant improvement. However, significant symptoms remain. Specifically, the patient has a painful lump in the upper outer left breast. The patient has a history of breast cysts identified in July of 2018. EXAM: DIGITAL DIAGNOSTIC BILATERAL MAMMOGRAM WITH TOMOSYNTHESIS AND CAD; ULTRASOUND RIGHT BREAST LIMITED; ULTRASOUND LEFT BREAST LIMITED TECHNIQUE: Bilateral digital diagnostic mammography and breast tomosynthesis was performed. The images were evaluated with computer-aided detection.; Targeted ultrasound examination of the right breast was performed; Targeted ultrasound examination of the left breast was performed. COMPARISON:  Previous exam(s). ACR Breast Density Category c: The breast tissue is heterogeneously dense, which may obscure small masses. FINDINGS: There is a mass in the upper right  breast at a posterior depth. There is a possible mass located just lateral to the dominant mass only seen on the cc view. No other mammographic findings are seen on the right. There is skin and trabecular thickening on the left. The patient is palpating a mass in the upper outer left breast which is new since 2018. Cysts seen at 12 o'clock in the left breast in 2018 have resolved. A mass in the inferior medial left breast is new in the interval. No other suspicious findings in either breast. On physical exam, there is mild warmth to the touch on the left. A palpable lump is identified in the upper-outer left breast. Targeted ultrasound is performed, showing a dominant simple cyst at 1 o'clock in the right breast correlating with the dominant mass seen mammographically. Cysts are also seen at 2 o'clock and 11 o'clock correlating with the other mammographic findings. There is an irregular solid appearing   mass at 3 o'clock, 1 cm from the nipple measuring 9 x 8 by 14 mm. The right axilla was not examined. Multiple cysts are seen in the left breast including at 2 o'clock and 8 o'clock correlating with mammographic findings. The dominant mass in the left breast seen on mammogram correlates with a hypoechoic mass at 2 o'clock, 5 cm from the nipple measuring 4.1 x 3.5 x 3.4 cm. This represents the patient's palpable lump. There is increased through transmission. Three abnormal nodes are seen in the right axilla with thickened cortices. A representative node demonstrates a cortex of 17 mm. IMPRESSION: 1. Bilateral fibrocystic changes. 2. Irregular solid mass in the right breast at 3 o'clock, 1 cm from the nipple. The axilla was inadvertently not imaged. 3. Indeterminate mass in the left breast at 2 o'clock, 5 cm from the nipple. This could represent an abscess or a solid mass. 4. Three abnormal nodes in the left axilla. RECOMMENDATION: Recommend aspiration versus biopsy of the 2 o'clock left breast mass. If this mass does  not aspirate and requires biopsy, recommend biopsy of 1 of the 3 abnormal left axillary lymph nodes. If the mass does aspirate and is thought to represent an abscess, recommend follow-up of the abnormal left axillary nodes. Recommend biopsy of the mass in the right breast at 3 o'clock, 1 cm from the nipple. Recommend ultrasound of the right axilla at the time of this biopsy. I have discussed the findings and recommendations with the patient. If applicable, a reminder letter will be sent to the patient regarding the next appointment. BI-RADS CATEGORY  4: Suspicious. Electronically Signed   By: David  Williams III M.D.   On: 01/30/2021 18:12  US BREAST LTD UNI RIGHT INC AXILLA  Result Date: 01/30/2021 CLINICAL DATA:  The patient recently had pain, erythema, warmth to the touch, and skin thickening on the left thought to represent infection. The patient was treated with 10 days of Augmentin with significant improvement. However, significant symptoms remain. Specifically, the patient has a painful lump in the upper outer left breast. The patient has a history of breast cysts identified in July of 2018. EXAM: DIGITAL DIAGNOSTIC BILATERAL MAMMOGRAM WITH TOMOSYNTHESIS AND CAD; ULTRASOUND RIGHT BREAST LIMITED; ULTRASOUND LEFT BREAST LIMITED TECHNIQUE: Bilateral digital diagnostic mammography and breast tomosynthesis was performed. The images were evaluated with computer-aided detection.; Targeted ultrasound examination of the right breast was performed; Targeted ultrasound examination of the left breast was performed. COMPARISON:  Previous exam(s). ACR Breast Density Category c: The breast tissue is heterogeneously dense, which may obscure small masses. FINDINGS: There is a mass in the upper right breast at a posterior depth. There is a possible mass located just lateral to the dominant mass only seen on the cc view. No other mammographic findings are seen on the right. There is skin and trabecular thickening on the  left. The patient is palpating a mass in the upper outer left breast which is new since 2018. Cysts seen at 12 o'clock in the left breast in 2018 have resolved. A mass in the inferior medial left breast is new in the interval. No other suspicious findings in either breast. On physical exam, there is mild warmth to the touch on the left. A palpable lump is identified in the upper-outer left breast. Targeted ultrasound is performed, showing a dominant simple cyst at 1 o'clock in the right breast correlating with the dominant mass seen mammographically. Cysts are also seen at 2 o'clock and 11 o'clock correlating with the other   mammographic findings. There is an irregular solid appearing mass at 3 o'clock, 1 cm from the nipple measuring 9 x 8 by 14 mm. The right axilla was not examined. Multiple cysts are seen in the left breast including at 2 o'clock and 8 o'clock correlating with mammographic findings. The dominant mass in the left breast seen on mammogram correlates with a hypoechoic mass at 2 o'clock, 5 cm from the nipple measuring 4.1 x 3.5 x 3.4 cm. This represents the patient's palpable lump. There is increased through transmission. Three abnormal nodes are seen in the right axilla with thickened cortices. A representative node demonstrates a cortex of 17 mm. IMPRESSION: 1. Bilateral fibrocystic changes. 2. Irregular solid mass in the right breast at 3 o'clock, 1 cm from the nipple. The axilla was inadvertently not imaged. 3. Indeterminate mass in the left breast at 2 o'clock, 5 cm from the nipple. This could represent an abscess or a solid mass. 4. Three abnormal nodes in the left axilla. RECOMMENDATION: Recommend aspiration versus biopsy of the 2 o'clock left breast mass. If this mass does not aspirate and requires biopsy, recommend biopsy of 1 of the 3 abnormal left axillary lymph nodes. If the mass does aspirate and is thought to represent an abscess, recommend follow-up of the abnormal left axillary nodes.  Recommend biopsy of the mass in the right breast at 3 o'clock, 1 cm from the nipple. Recommend ultrasound of the right axilla at the time of this biopsy. I have discussed the findings and recommendations with the patient. If applicable, a reminder letter will be sent to the patient regarding the next appointment. BI-RADS CATEGORY  4: Suspicious. Electronically Signed   By: Dorise Bullion III M.D.   On: 01/30/2021 18:12  MM DIAG BREAST TOMO BILATERAL  Result Date: 01/30/2021 CLINICAL DATA:  The patient recently had pain, erythema, warmth to the touch, and skin thickening on the left thought to represent infection. The patient was treated with 10 days of Augmentin with significant improvement. However, significant symptoms remain. Specifically, the patient has a painful lump in the upper outer left breast. The patient has a history of breast cysts identified in July of 2018. EXAM: DIGITAL DIAGNOSTIC BILATERAL MAMMOGRAM WITH TOMOSYNTHESIS AND CAD; ULTRASOUND RIGHT BREAST LIMITED; ULTRASOUND LEFT BREAST LIMITED TECHNIQUE: Bilateral digital diagnostic mammography and breast tomosynthesis was performed. The images were evaluated with computer-aided detection.; Targeted ultrasound examination of the right breast was performed; Targeted ultrasound examination of the left breast was performed. COMPARISON:  Previous exam(s). ACR Breast Density Category c: The breast tissue is heterogeneously dense, which may obscure small masses. FINDINGS: There is a mass in the upper right breast at a posterior depth. There is a possible mass located just lateral to the dominant mass only seen on the cc view. No other mammographic findings are seen on the right. There is skin and trabecular thickening on the left. The patient is palpating a mass in the upper outer left breast which is new since 2018. Cysts seen at 12 o'clock in the left breast in 2018 have resolved. A mass in the inferior medial left breast is new in the interval. No  other suspicious findings in either breast. On physical exam, there is mild warmth to the touch on the left. A palpable lump is identified in the upper-outer left breast. Targeted ultrasound is performed, showing a dominant simple cyst at 1 o'clock in the right breast correlating with the dominant mass seen mammographically. Cysts are also seen at 2 o'clock and  11 o'clock correlating with the other mammographic findings. There is an irregular solid appearing mass at 3 o'clock, 1 cm from the nipple measuring 9 x 8 by 14 mm. The right axilla was not examined. Multiple cysts are seen in the left breast including at 2 o'clock and 8 o'clock correlating with mammographic findings. The dominant mass in the left breast seen on mammogram correlates with a hypoechoic mass at 2 o'clock, 5 cm from the nipple measuring 4.1 x 3.5 x 3.4 cm. This represents the patient's palpable lump. There is increased through transmission. Three abnormal nodes are seen in the right axilla with thickened cortices. A representative node demonstrates a cortex of 17 mm. IMPRESSION: 1. Bilateral fibrocystic changes. 2. Irregular solid mass in the right breast at 3 o'clock, 1 cm from the nipple. The axilla was inadvertently not imaged. 3. Indeterminate mass in the left breast at 2 o'clock, 5 cm from the nipple. This could represent an abscess or a solid mass. 4. Three abnormal nodes in the left axilla. RECOMMENDATION: Recommend aspiration versus biopsy of the 2 o'clock left breast mass. If this mass does not aspirate and requires biopsy, recommend biopsy of 1 of the 3 abnormal left axillary lymph nodes. If the mass does aspirate and is thought to represent an abscess, recommend follow-up of the abnormal left axillary nodes. Recommend biopsy of the mass in the right breast at 3 o'clock, 1 cm from the nipple. Recommend ultrasound of the right axilla at the time of this biopsy. I have discussed the findings and recommendations with the patient. If  applicable, a reminder letter will be sent to the patient regarding the next appointment. BI-RADS CATEGORY  4: Suspicious. Electronically Signed   By: David  Williams III M.D.   On: 01/30/2021 18:12  US AXILLARY NODE CORE BIOPSY LEFT  Result Date: 02/12/2021 CLINICAL DATA:  44-year-old female with known left breast cancer presenting for biopsy of a left axillary lymph node. EXAM: US AXILLARY NODE CORE BIOPSY LEFT COMPARISON:  Previous exam(s). PROCEDURE: I met with the patient and we discussed the procedure of ultrasound-guided biopsy, including benefits and alternatives. We discussed the high likelihood of a successful procedure. We discussed the risks of the procedure, including infection, bleeding, tissue injury, clip migration, and inadequate sampling. Informed written consent was given. The usual time-out protocol was performed immediately prior to the procedure. Using sterile technique and 1% Lidocaine as local anesthetic, under direct ultrasound visualization, a 14 gauge spring-loaded device was used to perform biopsy of a left axillary lymph node using a lateral approach. At the conclusion of the procedure a tri bell tissue marker clip was deployed into the biopsy cavity. Follow up 2 view mammogram was performed and dictated separately. IMPRESSION: Ultrasound guided biopsy of a left axillary lymph node. No apparent complications. Electronically Signed   By: Nancy  Ballantyne M.D.   On: 02/12/2021 15:42  MM CLIP PLACEMENT LEFT  Result Date: 02/12/2021 CLINICAL DATA:  Post procedure mammogram for clip placement. EXAM: 3D DIAGNOSTIC LEFT MAMMOGRAM POST ULTRASOUND BIOPSY COMPARISON:  Previous exam(s). FINDINGS: 3D Mammographic images were obtained following ultrasound guided biopsy of a left axillary lymph node. The tri bell biopsy marking clip is in expected position at the site of biopsy. IMPRESSION: Appropriate positioning of the tribell shaped biopsy marking clip at the site of biopsy in the left  axilla. Final Assessment: Post Procedure Mammograms for Marker Placement Electronically Signed   By: Nancy  Ballantyne M.D.   On: 02/12/2021 15:46  MM CLIP PLACEMENT   RIGHT  Result Date: 01/30/2021 CLINICAL DATA:  Evaluate biopsy marker EXAM: 3D DIAGNOSTIC RIGHT MAMMOGRAM POST ULTRASOUND BIOPSY COMPARISON:  Previous exam(s). FINDINGS: 3D Mammographic images were obtained following ultrasound guided biopsy of a 3 o'clock right breast mass. The biopsy marking clip is in expected position at the site of biopsy. IMPRESSION: Appropriate positioning of the ribbon shaped biopsy marking clip at the site of biopsy in the region of the biopsied 3 o'clock right breast mass. Final Assessment: Post Procedure Mammograms for Marker Placement Electronically Signed   By: David  Williams III M.D.   On: 01/30/2021 18:14  US LT BREAST BX W LOC DEV 1ST LESION IMG BX SPEC US GUIDE  Addendum Date: 02/11/2021   ADDENDUM REPORT: 02/11/2021 10:25 ADDENDUM: Pathology revealed GRADE III INVASIVE DUCTAL CARCINOMA of the Left breast, 2 o'clock, (coil clip). This was found to be concordant by Dr. Nancy Ballantyne. Pathology results were discussed with the patient by telephone by Susan Eaton, RN Nurse Navigator. The patient reported doing well after the biopsy with tenderness at the site. Post biopsy instructions and care were reviewed and questions were answered. The patient was encouraged to call [facility] for any additional concerns. The patient was referred to [facility] on February 13, 2021. The patient is scheduled for a Left axillary lymph node biopsy on February 12, 2021. Further recommendations will be guided by the results of this biopsy. Recommendation for a bilateral breast MRI to exclude any additional sites of disease given heterogeneously dense breast tissue and age. Pathology results reported by Lynne Bailey, RN on  02/11/2021. Electronically Signed   By: Nancy  Ballantyne M.D.   On: 02/11/2021 10:25   Result Date: 02/11/2021 CLINICAL DATA:  44-year-old female presenting for ultrasound-guided aspiration/possible biopsy of a left breast mass. EXAM: ULTRASOUND GUIDED LEFT BREAST CORE NEEDLE BIOPSY COMPARISON:  Previous exam(s). PROCEDURE: I met with the patient and we discussed the procedure of ultrasound-guided biopsy, including benefits and alternatives. We discussed the high likelihood of a successful procedure. We discussed the risks of the procedure, including infection, bleeding, tissue injury, clip migration, and inadequate sampling. Informed written consent was given. The usual time-out protocol was performed immediately prior to the procedure. Lesion quadrant: Upper outer quadrant Using sterile technique and 1% Lidocaine as local anesthetic, under direct ultrasound visualization, aspiration of the left breast mass at 2 o'clock was attempted. Approximately 3 mm of bloody fluid was aspirated. Since minimal fluid could be obtained, the procedure was converted to biopsy. Under direct ultrasound visualization a 14 gauge spring-loaded device was used to perform biopsy of the mass in the left breast at 2 o'clock using a lateral approach. At the conclusion of the procedure a coil tissue marker clip was deployed into the biopsy cavity. IMPRESSION: 1. Ultrasound guided biopsy of a left breast mass at 2 o'clock. No apparent complications. 2. The patient's antibiotic will be switched while pathology is pending. She was given a prescription for Augmentin 875 mg b.i.d. times 10 days. Electronically Signed: By: Nancy  Ballantyne M.D. On: 02/07/2021 14:57  US RT BREAST BX W LOC DEV 1ST LESION IMG BX SPEC US GUIDE  Addendum Date: 02/01/2021   ADDENDUM REPORT: 01/31/2021 11:18 ADDENDUM: Pathology revealed PREDOMINANTLY FATTY TISSUE WITH FOCAL BENIGN BREAST TISSUE of the RIGHT breast, 3 o'clock, 1cmfn, ribbon clip. A distinct lesion  is not seen. This was found to be concordant by Dr. David Williams. Pathology results were   discussed with the patient by telephone. The patient reported doing well after the biopsy with tenderness at the site. Post biopsy instructions and care were reviewed and questions were answered. The patient was encouraged to call [facility] for any additional concerns. The patient was instructed to return for annual screening mammography due September 2023 and informed a reminder notice would be sent regarding this appointment. Pathology results reported by Susan Eaton RN on 01/31/2021. Electronically Signed   By: David  Williams III M.D.   On: 01/31/2021 11:18   Result Date: 02/01/2021 CLINICAL DATA:  Biopsy of a 3 o'clock right breast mass. EXAM: ULTRASOUND GUIDED RIGHT BREAST CORE NEEDLE BIOPSY COMPARISON:  Previous exam(s). PROCEDURE: I met with the patient and we discussed the procedure of ultrasound-guided biopsy, including benefits and alternatives. We discussed the high likelihood of a successful procedure. We discussed the risks of the procedure, including infection, bleeding, tissue injury, clip migration, and inadequate sampling. Informed written consent was given. The usual time-out protocol was performed immediately prior to the procedure. Lesion quadrant: 3 o'clock right breast Using sterile technique and 1% Lidocaine as local anesthetic, under direct ultrasound visualization, a 12 gauge spring-loaded device was used to perform biopsy of a 3 o'clock right breast mass using a medial approach. At the conclusion of the procedure a ribbon shaped tissue marker clip was deployed into the biopsy cavity. Follow up 2 view mammogram was performed and dictated separately. IMPRESSION: Ultrasound guided biopsy of a 3 o'clock right breast mass. No apparent complications. Electronically Signed: By: David  Williams III M.D. On: 01/30/2021 18:19  US BREAST ASPIRATION LEFT  Addendum Date:  02/08/2021   ADDENDUM REPORT: 02/07/2021 15:16 ADDENDUM: LEFT breast aspiration yielded Gram Stain-FEW WBC PRESENT, PREDOMINANTLY MONONUCLEAR, NO ORGANISMS SEEN. Culture-No growth aerobically or anaerobically. The patient was notified of results by Dr. Nancy Ballantyne on February 07, 2021 at the time of LEFT breast ultrasound and biopsy. These procedures are dictated in separate reports. The patient reported area not improving and taking antibiotics as prescribed. Further recommendations will be guided by the results of the biopsy performed on February 07, 2021. Pathology results reported by Lynne Bailey, RN on 02/07/2021. Electronically Signed   By: David  Williams III M.D.   On: 02/07/2021 15:16   Result Date: 02/08/2021 CLINICAL DATA:  The patient presents for aspiration versus biopsy of a left breast mass. EXAM: ULTRASOUND GUIDED LEFT BREAST POSSIBLE ABSCESS ASPIRATION COMPARISON:  Previous exams. PROCEDURE: The patient and I discussed the procedure of ultrasound-guided aspiration including benefits and alternatives. We discussed the high likelihood of a successful procedure. We discussed the risks of the procedure including infection, bleeding, tissue injury, and inadequate sampling. Informed written consent was given. The usual time out protocol was performed immediately prior to the procedure. Using sterile technique, 1% lidocaine, under direct ultrasound visualization, needle aspiration of a possible abscess was performed. 20 cc of bloody fluid was aspirated from the possible abscess. Both the numbing needle and the introducer used for aspiration moved easily in the mass and the mass is thought to represent an abscess. IMPRESSION: Ultrasound-guided aspiration of a probable left breast abscess. No apparent complications. RECOMMENDATIONS: The patient was given a 14 day course of doxycycline. The patient should follow-up in 1 week for ultrasound of the left breast probable abscess and the probable reactive  left axillary nodes. The findings should be followed to complete resolution. Once the patient's symptoms have resolved, a final follow-up ultrasound and mammogram should be performed   on the left. If the 2 o'clock left breast mass does not resolve, recommend biopsy. If the abnormal nodes do not return to normal, recommend biopsy. Electronically Signed: By: David  Williams III M.D. On: 01/30/2021 18:18     IMPRESSION/PLAN: This is a very nice 44-year-old woman with a new diagnosis of left breast cancer, locally advanced  Given the size of her tumor, the histology, and the nodal positivity, she will need adjuvant radiation regardless of whether she undergoes mastectomy or lumpectomy  It was a pleasure meeting the patient today. We discussed the risks, benefits, and side effects of radiotherapy. I recommend radiotherapy to the left breast or chest wall and regional nodes to reduce her risk of locoregional recurrence by 2/3.  We discussed that radiation would take approximately 6 weeks to complete and that I would give the patient a few weeks to heal following surgery before starting treatment planning.   We spoke about acute effects including skin irritation and fatigue as well as much less common late effects including internal organ injury or irritation. We spoke about the latest technology that is used to minimize the risk of late effects for patients undergoing radiotherapy to the breast or chest wall. No guarantees of treatment were given. The patient is enthusiastic about proceeding with treatment. I look forward to participating in the patient's care.  I will await her referral back to me for postoperative follow-up and eventual CT simulation/treatment planning.  On date of service, in total, I spent 45 minutes on this encounter. Patient was seen in person.   __________________________________________   Sarah Squire, MD  This document serves as a record of services personally performed by Sarah  Squire, MD. It was created on her behalf by Elisa Frazier, a trained medical scribe. The creation of this record is based on the scribe's personal observations and the provider's statements to them. This document has been checked and approved by the attending provider.  

## 2021-02-13 ENCOUNTER — Encounter: Payer: Self-pay | Admitting: Genetic Counselor

## 2021-02-13 ENCOUNTER — Inpatient Hospital Stay (HOSPITAL_BASED_OUTPATIENT_CLINIC_OR_DEPARTMENT_OTHER): Payer: BC Managed Care – PPO | Admitting: Genetic Counselor

## 2021-02-13 ENCOUNTER — Encounter: Payer: Self-pay | Admitting: Oncology

## 2021-02-13 ENCOUNTER — Ambulatory Visit: Payer: BC Managed Care – PPO | Attending: Surgery | Admitting: Physical Therapy

## 2021-02-13 ENCOUNTER — Encounter: Payer: Self-pay | Admitting: *Deleted

## 2021-02-13 ENCOUNTER — Encounter: Payer: Self-pay | Admitting: Radiation Oncology

## 2021-02-13 ENCOUNTER — Other Ambulatory Visit: Payer: Self-pay | Admitting: Surgery

## 2021-02-13 ENCOUNTER — Ambulatory Visit
Admission: RE | Admit: 2021-02-13 | Discharge: 2021-02-13 | Disposition: A | Discharge status: Home / Self Care | Payer: BC Managed Care – PPO | Source: Ambulatory Visit | Attending: Radiation Oncology | Admitting: Radiation Oncology

## 2021-02-13 ENCOUNTER — Inpatient Hospital Stay: Payer: BC Managed Care – PPO | Attending: Oncology

## 2021-02-13 ENCOUNTER — Inpatient Hospital Stay (HOSPITAL_BASED_OUTPATIENT_CLINIC_OR_DEPARTMENT_OTHER): Payer: BC Managed Care – PPO | Admitting: Oncology

## 2021-02-13 ENCOUNTER — Encounter: Payer: Self-pay | Admitting: Physical Therapy

## 2021-02-13 VITALS — BP 146/70 | HR 79 | Temp 97.9°F | Resp 18 | Ht 71.0 in | Wt 192.2 lb

## 2021-02-13 DIAGNOSIS — Z170 Estrogen receptor positive status [ER+]: Secondary | ICD-10-CM

## 2021-02-13 DIAGNOSIS — C773 Secondary and unspecified malignant neoplasm of axilla and upper limb lymph nodes: Secondary | ICD-10-CM | POA: Diagnosis not present

## 2021-02-13 DIAGNOSIS — M25512 Pain in left shoulder: Secondary | ICD-10-CM | POA: Diagnosis present

## 2021-02-13 DIAGNOSIS — C50412 Malignant neoplasm of upper-outer quadrant of left female breast: Secondary | ICD-10-CM | POA: Diagnosis present

## 2021-02-13 DIAGNOSIS — Z90721 Acquired absence of ovaries, unilateral: Secondary | ICD-10-CM | POA: Diagnosis not present

## 2021-02-13 DIAGNOSIS — Z803 Family history of malignant neoplasm of breast: Secondary | ICD-10-CM

## 2021-02-13 DIAGNOSIS — Z90712 Acquired absence of cervix with remaining uterus: Secondary | ICD-10-CM | POA: Diagnosis not present

## 2021-02-13 DIAGNOSIS — Z800 Family history of malignant neoplasm of digestive organs: Secondary | ICD-10-CM

## 2021-02-13 DIAGNOSIS — M25612 Stiffness of left shoulder, not elsewhere classified: Secondary | ICD-10-CM | POA: Insufficient documentation

## 2021-02-13 DIAGNOSIS — R293 Abnormal posture: Secondary | ICD-10-CM

## 2021-02-13 LAB — CBC WITH DIFFERENTIAL (CANCER CENTER ONLY)
Abs Immature Granulocytes: 0.02 10*3/uL (ref 0.00–0.07)
Basophils Absolute: 0.1 10*3/uL (ref 0.0–0.1)
Basophils Relative: 1 %
Eosinophils Absolute: 0.1 10*3/uL (ref 0.0–0.5)
Eosinophils Relative: 2 %
HCT: 29.1 % — ABNORMAL LOW (ref 36.0–46.0)
Hemoglobin: 8.7 g/dL — ABNORMAL LOW (ref 12.0–15.0)
Immature Granulocytes: 0 %
Lymphocytes Relative: 29 %
Lymphs Abs: 2.3 10*3/uL (ref 0.7–4.0)
MCH: 21.3 pg — ABNORMAL LOW (ref 26.0–34.0)
MCHC: 29.9 g/dL — ABNORMAL LOW (ref 30.0–36.0)
MCV: 71.1 fL — ABNORMAL LOW (ref 80.0–100.0)
Monocytes Absolute: 0.5 10*3/uL (ref 0.1–1.0)
Monocytes Relative: 7 %
Neutro Abs: 5 10*3/uL (ref 1.7–7.7)
Neutrophils Relative %: 61 %
Platelet Count: 401 10*3/uL — ABNORMAL HIGH (ref 150–400)
RBC: 4.09 MIL/uL (ref 3.87–5.11)
RDW: 18.3 % — ABNORMAL HIGH (ref 11.5–15.5)
WBC Count: 8 10*3/uL (ref 4.0–10.5)
nRBC: 0 % (ref 0.0–0.2)

## 2021-02-13 LAB — CMP (CANCER CENTER ONLY)
ALT: 12 U/L (ref 0–44)
AST: 19 U/L (ref 15–41)
Albumin: 3.5 g/dL (ref 3.5–5.0)
Alkaline Phosphatase: 93 U/L (ref 38–126)
Anion gap: 9 (ref 5–15)
BUN: 14 mg/dL (ref 6–20)
CO2: 23 mmol/L (ref 22–32)
Calcium: 9.1 mg/dL (ref 8.9–10.3)
Chloride: 106 mmol/L (ref 98–111)
Creatinine: 0.88 mg/dL (ref 0.44–1.00)
GFR, Estimated: >60 mL/min (ref >60)
Glucose, Bld: 96 mg/dL (ref 70–99)
Potassium: 4 mmol/L (ref 3.5–5.1)
Sodium: 138 mmol/L (ref 135–145)
Total Bilirubin: 0.3 mg/dL (ref 0.3–1.2)
Total Protein: 7 g/dL (ref 6.5–8.1)

## 2021-02-13 LAB — GENETIC SCREENING ORDER

## 2021-02-13 MED ORDER — LIDOCAINE-PRILOCAINE 2.5-2.5 % EX CREA: TOPICAL_CREAM | CUTANEOUS | 3 refills | Status: DC

## 2021-02-13 MED ORDER — PROCHLORPERAZINE MALEATE 10 MG PO TABS
10.0000 mg | ORAL_TABLET | Freq: Four times a day (QID) | ORAL | 1 refills | Status: DC | PRN
Start: 2021-02-13 — End: 2021-03-01

## 2021-02-13 NOTE — Progress Notes (Signed)
START ON PATHWAY REGIMEN - Breast     Cycles 1 through 12: A cycle is every 7 days:     Paclitaxel    Cycles 13 through 16: A cycle is every 14 days:     Doxorubicin      Cyclophosphamide      Pegfilgrastim-[id number]   **Always confirm dose/schedule in your pharmacy ordering system**  Patient Characteristics: Preoperative or Nonsurgical Candidate (Clinical Staging), Neoadjuvant Therapy followed by Surgery, Invasive Disease, Chemotherapy, HER2 Negative/Unknown/Equivocal, ER Positive Therapeutic Status: Preoperative or Nonsurgical Candidate (Clinical Staging) AJCC M Category: cM0 AJCC Grade: G3 Breast Surgical Plan: Neoadjuvant Therapy followed by Surgery ER Status: Positive (+) AJCC 8 Stage Grouping: IIIA HER2 Status: Negative (-) AJCC T Category: cT2 AJCC N Category: cN1 PR Status: Negative (-) Intent of Therapy: Curative Intent, Discussed with Patient

## 2021-02-13 NOTE — Progress Notes (Signed)
REFERRING PROVIDER: Lurline Del, MD [place], [place] [address]  PRIMARY PROVIDER:  [name], NP  PRIMARY REASON FOR VISIT:  Encounter Diagnoses  Name Primary?   Malignant neoplasm of upper-outer quadrant of left breast in female, estrogen receptor positive ([place]) Yes   Family history of breast cancer    Family history of colon cancer    HISTORY OF PRESENT ILLNESS:   Ms. Meisenheimer, a 45 y.o. female, was seen for a [place] cancer genetics consultation during the breast multidisciplinary clinic at the request of Dr. Felton Clinton due to a personal and family history of cancer.  Ms. Bacha presents to clinic today to discuss the possibility of a hereditary predisposition to cancer, to discuss genetic testing, and to further clarify her future cancer risks, as well as potential cancer risks for family members.   In September 2022, at the age of 65, Ms. Tranchina was diagnosed with invasive ductal carcinoma of the left breast. The tumor is described as ER positive, PR negative, HER2 negative. The treatment plan is pending.   RISK FACTORS:  Menarche was at age 34.  First live birth at age 81.  OCP use for approximately 0 years.  Ovaries intact: left oophorectomy Uterus intact: yes.  Menopausal status: premenopausal.  HRT use: 0 years. Colonoscopy: yes; no polyps identified Mammogram within the last year: yes. Up to date with pelvic exams: yes. Any excessive radiation exposure in the past: no  Past Medical History:  Diagnosis Date   Allergy    seasonal    Anemia    Diabetes mellitus without complication (HCC)    Gestational   IBS (irritable bowel syndrome)     Past Surgical History:  Procedure Laterality Date   BILATERAL SALPINGECTOMY Bilateral 2014   emergency   LEFT OOPHORECTOMY Left 2006    Social History   Socioeconomic History   Marital status: Divorced    Spouse name: Not on file   Number of children: 3   Years of education: Not on  file   Highest education level: Not on file  Occupational History   Occupation: nurse    Comment: mental health  Tobacco Use   Smoking status: Never   Smokeless tobacco: Never  Substance and Sexual Activity   Alcohol use: Yes    Alcohol/week: 5.0 standard drinks    Types: 2 Standard drinks or equivalent, 1 Shots of liquor, 1 Cans of beer, 1 Glasses of wine per week   Drug use: No   Sexual activity: Not on file  Other Topics Concern   Not on file  Social History Narrative   Lives with her youngest child.   Social Determinants of Health   Financial Resource Strain: Not on file  Food Insecurity: Not on file  Transportation Needs: Not on file  Physical Activity: Not on file  Stress: Not on file  Social Connections: Not on file     FAMILY HISTORY:  We obtained a detailed, 4-generation family history.  Significant diagnoses are listed below:  Family History  Problem Relation Age of Onset   Breast cancer Maternal Aunt        dx. 69s   Colon cancer Maternal Grandmother        dx. >50   Lung cancer Paternal Grandfather       Ms. Potempa's maternal aunt was diagnosed with breast cancer in her 72s. Her maternal grandmother was diagnosed with colon cancer (>23 years old), she died due to colon cancer metastasis. Her  maternal grandmother's sister was diagnosed with colon cancer at an unknown age, she is deceased. Her paternal grandfather was diagnosed with lung cancer (>9 years old), he smoked and is deceased.   Ms. Asselin reports her sister had negative genetic testing for hereditary cancer risks. There no reported Ashkenazi Jewish ancestry.   GENETIC COUNSELING ASSESSMENT: Ms. Houchins is a 45 y.o. female with a personal and family history of cancer which is somewhat suggestive of a hereditary cancer syndrome and predisposition to cancer given her young age at diagnosis. We, therefore, discussed and recommended the following at today's visit.   DISCUSSION: We discussed  that 5 - 10% of cancer is hereditary, with most cases of hereditary breast cancer associated with mutations in BRCA1/2.  There are other genes that can be associated with hereditary breast cancer syndromes. Type of cancer risk and level of risk are gene-specific. We discussed that testing is beneficial for several reasons including knowing how to follow individuals after completing their treatment, identifying whether potential treatment options would be beneficial, and understanding if other family members could be at risk for cancer and allowing them to undergo genetic testing.   We reviewed the characteristics, features and inheritance patterns of hereditary cancer syndromes. We also discussed genetic testing, including the appropriate family members to test, the process of testing, insurance coverage and turn-around-time for results. We discussed the implications of a negative, positive and/or variant of uncertain significant result. In order to get genetic test results in a timely manner so that Ms. Solano can use these genetic test results for surgical decisions, we recommended Ms. Matos pursue genetic testing for the [employer]. Once complete, we recommend Ms. Alcalde pursue reflex genetic testing to a more comprehensive gene panel.   Ms. Beth  was offered a common hereditary cancer panel (47 genes) and an expanded pan-cancer panel (77 genes). Ms. Spraker was informed of the benefits and limitations of each panel, including that expanded pan-cancer panels contain genes that do not have clear management guidelines at this point in time.  We also discussed that as the number of genes included on a panel increases, the chances of variants of uncertain significance increases.  After considering the benefits and limitations of each gene panel, Ms. Fulbright elected to have [facility] CustomNext Panel+RNAinsight (47 genes).   The CustomNext-Cancer+RNAinsight panel offered by [facility]  includes sequencing and rearrangement analysis for the following 47 genes:  APC, ATM, AXIN2, BARD1, BMPR1A, BRCA1, BRCA2, BRIP1, CDH1, CDK4, CDKN2A, CHEK2, DICER1, EPCAM, GREM1, HOXB13, MEN1, MLH1, MSH2, MSH3, MSH6, MUTYH, NBN, NF1, NF2, NTHL1, PALB2, PMS2, POLD1, POLE, PTEN, RAD51C, RAD51D, RECQL, RET, SDHA, SDHAF2, SDHB, SDHC, SDHD, SMAD4, SMARCA4, STK11, TP53, TSC1, TSC2, and VHL.  RNA data is routinely analyzed for use in variant interpretation for all genes.   Based on Ms. Grenda's personal and family history of cancer, she meets medical criteria for genetic testing. Despite that she meets criteria, she may still have an out of pocket cost. We discussed that if her out of pocket cost for testing is over $100, the laboratory should contact them to discuss self-pay prices, patient pay assistance programs, if applicable, and other billing options.   PLAN: After considering the risks, benefits, and limitations, Ms. Callegari provided informed consent to pursue genetic testing and the blood sample was sent to [facility] for analysis of the BRCAplus with reflex to CustomNext+RNA (47 genes). Results should be available within approximately 1-2 weeks' time, at which point they will be disclosed by telephone  to Ms. Brocato, as will any additional recommendations warranted by these results. Ms. Sangiovanni will receive a summary of her genetic counseling visit and a copy of her results once available. This information will also be available in Epic.   Ms. Mcelhannon questions were answered to her satisfaction today. Our contact information was provided should additional questions or concerns arise. Thank you for the referral and allowing [place] to share in the care of your patient.   Lucille Passy, MS, [facility] Genetic Counselor [email or web address] (P) [phone]  The patient was seen for a total of 20 minutes in face-to-face genetic counseling. The patient was seen alone.  Drs. Magrinat,  Lindi Adie and/or Burr Medico were available to discuss this case as needed.  _______________________________________________________________________ For Office Staff:  Number of people involved in session: 1 Was an Intern/ student involved with case: no

## 2021-02-13 NOTE — Therapy (Addendum)
 [facility] [address] [place], [place], [address] Phone: [phone]   Fax:  [phone]  Physical Therapy Evaluation/ Discharge NOTE  Patient Details  Name: Catherine Ellison MRN: [id number] Date of Birth: [birth year] Referring Provider (PT): Dr. Vicenta Poli   Encounter Date: 02/13/2021   PT End of Session - 02/13/21 1347     Visit Number 1    Number of Visits 2    Date for PT Re-Evaluation 08/13/21    PT Start Time 1047    PT Stop Time 1104   Also saw pt from [phone] for a total of 30 min   PT Time Calculation (min) 17 min    Activity Tolerance Patient tolerated treatment well    Behavior During Therapy [facility] for tasks assessed/performed             Past Medical History:  Diagnosis Date   Allergy    seasonal    Anemia    Diabetes mellitus without complication (HCC)    Gestational   IBS (irritable bowel syndrome)     Past Surgical History:  Procedure Laterality Date   BILATERAL SALPINGECTOMY Bilateral 2014   emergency   LEFT OOPHORECTOMY Left 2006    There were no vitals filed for this visit.    Subjective Assessment - 02/13/21 1051     Subjective Patient reports she is here today to be seen by her medical team for her newly diagnosed left breast cancer.    Pertinent History Patient was diagnosed on 01/30/2021 with left grade III invasive ductal carcinoma breast cancer. It measures 4.1 cm and is located in the upper outer quadrant. It is weakly ER positive, PR negative, and HER2 negative with a Ki67 of 40%. She has 3 abnormal appearing axillary lymph nodes and 1 was biopsied and found to be positive.    Currently in Pain? Yes    Pain Score 7     Pain Location Breast    Pain Orientation Left    Pain Descriptors / Indicators Burning;Throbbing;Sore    Pain Type Other (Comment)   cancer pain   Pain Radiating Towards left UE    Pain Frequency Constant    Aggravating Factors  Nothing    Pain  Relieving Factors Nothing                [facility] PT Assessment - 02/13/21 0001       Assessment   Medical Diagnosis Left breast cancer    Referring Provider (PT) Dr. Vicenta Poli    Onset Date/Surgical Date 01/30/21    Hand Dominance Right    Prior Therapy none      Precautions   Precautions Other (comment)    Precaution Comments active cancer      Restrictions   Weight Bearing Restrictions No      Balance Screen   Has the patient fallen in the past 6 months No    Has the patient had a decrease in activity level because of a fear of falling?  No    Is the patient reluctant to leave their home because of a fear of falling?  No      Home Environment   Living Environment Private residence    Living Arrangements Children   65 y.o. son   Available Help at Discharge Family      Prior Function   Level of Independence Independent    Vocation Full time employment    Vocation Requirements LPN working with patients  Leisure Not exercising currently      Cognition   Overall Cognitive Status Within Functional Limits for tasks assessed      Posture/Postural Control   Posture/Postural Control Postural limitations    Postural Limitations Rounded Shoulders;Forward head      ROM / Strength   AROM / PROM / Strength AROM;Strength      AROM   Overall AROM Comments Painful left shoulder with AROM likely due to breast cancer    AROM Assessment Site Shoulder    Right/Left Shoulder Right;Left    Right Shoulder Extension 46 Degrees    Right Shoulder Flexion 164 Degrees    Right Shoulder ABduction 168 Degrees    Right Shoulder Internal Rotation 64 Degrees    Right Shoulder External Rotation 83 Degrees    Left Shoulder Extension 43 Degrees    Left Shoulder Flexion 121 Degrees    Left Shoulder ABduction 145 Degrees    Left Shoulder Internal Rotation 60 Degrees    Left Shoulder External Rotation 70 Degrees      Strength   Overall Strength Within functional limits for tasks  performed               LYMPHEDEMA/ONCOLOGY QUESTIONNAIRE - 02/13/21 0001       Type   Cancer Type Left breast cancer      Lymphedema Assessments   Lymphedema Assessments Upper extremities      Right Upper Extremity Lymphedema   10 cm Proximal to Olecranon Process 31.4 cm    Olecranon Process 27.1 cm    10 cm Proximal to Ulnar Styloid Process 23.1 cm    Just Proximal to Ulnar Styloid Process 17 cm    Across Hand at [facility] 21 cm    At [place] of 2nd Digit 6.6 cm      Left Upper Extremity Lymphedema   10 cm Proximal to Olecranon Process 31.2 cm    Olecranon Process 26.8 cm    10 cm Proximal to Ulnar Styloid Process 22.5 cm    Just Proximal to Ulnar Styloid Process 16.5 cm    Across Hand at [facility] 20 cm    At [place] of 2nd Digit 6.3 cm             L-DEX FLOWSHEETS - 02/13/21 1300       L-DEX LYMPHEDEMA SCREENING   Measurement Type Unilateral    L-DEX MEASUREMENT EXTREMITY Upper Extremity    POSITION  Standing    DOMINANT SIDE Right    At Risk Side Left    BASELINE SCORE (UNILATERAL) -2.5             The patient was assessed using the L-Dex machine today to produce a lymphedema index baseline score. The patient will be reassessed on a regular basis (typically every 3 months) to obtain new L-Dex scores. If the score is > 6.5 points away from his/her baseline score indicating onset of subclinical lymphedema, it will be recommended to wear a compression garment for 4 weeks, 12 hours per day and then be reassessed. If the score continues to be > 6.5 points from baseline at reassessment, we will initiate lymphedema treatment. Assessing in this manner has a 95% rate of preventing clinically significant lymphedema.      Junie Palin - 02/13/21 0001     Open a tight or new jar Moderate difficulty    Do heavy household chores (wash walls, wash floors) Moderate difficulty    Carry a shopping bag  or briefcase Moderate difficulty    Wash your back Moderate  difficulty    Use a knife to cut food No difficulty    Recreational activities in which you take some force or impact through your arm, shoulder, or hand (golf, hammering, tennis) Moderate difficulty    During the past week, to what extent has your arm, shoulder or hand problem interfered with your normal social activities with family, friends, neighbors, or groups? Quite a bit    During the past week, to what extent has your arm, shoulder or hand problem limited your work or other regular daily activities Moderer[place]    Arm, shoulder, or hand pain. Severe    Tingling (pins and needles) in your arm, shoulder, or hand Moderate    Difficulty Sleeping Severe difficulty    DASH Score 52.27 %              Objective measurements completed on examination: See above findings.        Patient was instructed today in a home exercise program today for post op shoulder range of motion. These included active assist shoulder flexion in sitting, scapular retraction, wall walking with shoulder abduction, and hands behind head external rotation.  She was encouraged to do these twice a day, holding 3 seconds and repeating 5 times when permitted by her physician.          PT Education - 02/13/21 1346     Education Details Lymphedema risk reduction and post op shoulder ROM HEP    Person(s) Educated Patient    Methods Explanation;Demonstration;Handout    Comprehension Returned demonstration;Verbalized understanding                 PT Long Term Goals - 02/13/21 1315       PT LONG TERM GOAL #1   Title Patient will demonstrate she has regained shoulder ROM and function post operatively compared to baselines.    Time 6    Period Months    Status New    Target Date 08/13/21             Breast Clinic Goals - 02/13/21 1314       Patient will be able to verbalize understanding of pertinent lymphedema risk reduction practices relevant to her diagnosis specifically related to skin  care.   Time 1    Period Days    Status Achieved      Patient will be able to return demonstrate and/or verbalize understanding of the post-op home exercise program related to regaining shoulder range of motion.   Time 1    Period Days    Status Achieved      Patient will be able to verbalize understanding of the importance of attending the postoperative After Breast Cancer Class for further lymphedema risk reduction education and therapeutic exercise.   Time 1    Period Days    Status Achieved                   Plan - 02/13/21 1252     Clinical Impression Statement Patient was diagnosed on 01/30/2021 with left grade III invasive ductal carcinoma breast cancer. It measures 4.1 cm and is located in the upper outer quadrant. It is weakly ER positive, PR negative, and HER2 negative with a Ki67 of 40%. She has 3 abnormal appearing axillary lymph nodes and 1 was biopsied and found to be positive. Her multidisciplinary medical team met prior to her assessments to determine a recommended  treatment plan. She is planning to have an MRI and genetic testing completed followed by neoadjuvant chemotherapy, likely a left mastectomy and either a targeted node dissection or axillary lymph node dissection (depending on MRI results), radiation, and anti-estrogen therapy. She will benefit from a post op PT reassessment to determine needs and from L-Dex screens every 3 months for 2 years to detect subclinical lymphedema.    Stability/Clinical Decision Making Stable/Uncomplicated    Clinical Decision Making Low    Rehab Potential Excellent    PT Frequency --   Eval and 1 f/u visit   PT Treatment/Interventions ADLs/Self Care Home Management;Therapeutic exercise;Patient/family education    PT Next Visit Plan Will reassess 3-4 weeks post op    PT Home Exercise Plan Post op shoulder ROM HEP    Consulted and Agree with Plan of Care Patient             Patient will benefit from skilled therapeutic  intervention in order to improve the following deficits and impairments:  Postural dysfunction, Decreased range of motion, Decreased knowledge of precautions, Impaired UE functional use, Pain  Visit Diagnosis: Malignant neoplasm of upper-outer quadrant of left breast in female, estrogen receptor positive (HCC) - Plan: PT plan of care cert/re-cert  Abnormal posture - Plan: PT plan of care cert/re-cert  Stiffness of left shoulder, not elsewhere classified - Plan: PT plan of care cert/re-cert  Acute pain of left shoulder - Plan: PT plan of care cert/re-cert  Patient will follow up at outpatient cancer rehab 3-4 weeks following surgery.  If the patient requires physical therapy at that time, a specific plan will be dictated and sent to the referring physician for approval. The patient was educated today on appropriate basic range of motion exercises to begin post operatively and the importance of attending the After Breast Cancer class following surgery.  Patient was educated today on lymphedema risk reduction practices as it pertains to recommendations that will benefit the patient immediately following surgery.  She verbalized good understanding.      Problem List Patient Active Problem List   Diagnosis Date Noted   Family history of breast cancer 02/13/2021   Family history of colon cancer 02/13/2021   Malignant neoplasm of upper-outer quadrant of left breast in female, estrogen receptor positive (HCC) 02/11/2021   Retained tampon 06/30/2019   Cervical cancer screening 06/30/2019   Menorrhagia with regular cycle 06/30/2019   Vaginal bleeding 06/30/2019   Breast lump 11/06/2016   Adjustment disorder with mixed anxiety and depressed mood 11/06/2016   Eward Wonda Sharps, PT 02/13/21 2:53 PM   [facility] [address] [place], [place], [address] Phone: [phone]   Fax:  [phone]  Name: ARIELLE EBER MRN:  [id number] Date of Birth: 1976-02-10  PHYSICAL THERAPY DISCHARGE SUMMARY  Visits from Start of Care: 1  Patient is being discharged due to not returning since last visit  Patient agrees to discharge. Patient goals were not met. Patient is being discharged due to not returning since the last visit.  Kristeen Sar, PT, DPT 04/18/24 3:57 PM

## 2021-02-13 NOTE — Progress Notes (Signed)
[place]  [phone] [phone]     ID: Catherine Ellison DOB: 1976-01-01  MR#: [id number]  BSW#:[id number]  Patient Care Team: [name], NP as PCP - General (Adult Health Nurse Practitioner) Coralie Keens, MD as Consulting Physician (General Surgery) Devian Bartolomei, Virgie Dad, MD as Consulting Physician (Oncology) Eppie Gibson, MD as Attending Physician (Radiation Oncology) Rockwell Germany, RN as Oncology Nurse Navigator Mauro Kaufmann, RN as Oncology Nurse Navigator [name], MD OTHER MD:  CHIEF COMPLAINT: Estrogen receptor positive breast cancer  CURRENT TREATMENT: Neoadjuvant chemotherapy   HISTORY OF CURRENT ILLNESS: [name] "T" presented with pain, erythema, warmth, and skin thickening to the left breast. She was treated with 10 days of Augmentin, which overall improved her symptoms. However, she had a persistent  lump in the upper-outer left breast. She underwent bilateral diagnostic mammography with tomography and bilateral breast ultrasonography at [facility] on 01/30/2021 showing: breast density category C; bilateral fibrocystic changes; 1.4 cm irregular mass in right breast at 3 o'clock; palpable, indeterminate 4.1 cm left breast mass at 2 o'clock; three abnormal nodes in left axilla; right axilla not imaged.  Accordingly on 01/30/2021 she proceeded to biopsy of the right breast area in question. The pathology from this procedure ([id number]) showed: predominantly fatty tissue with focal benign breast tissue.  She also underwent biopsy of the left breast area in question on 02/07/2021. The pathology from this procedure ([id number]) showed: invasive ductal carcinoma, grade 3. Prognostic indicators significant for: estrogen receptor, 70% positive with weak staining intensity and progesterone receptor, 0% negative. Proliferation marker Ki67 at 40%. HER2 equivocal by immunohistochemistry (2+), but negative  by fluorescent in situ hybridization with a signals ratio 1.49 and number per cell 3.50.  She also underwent biopsy of a left axillary node yesterday, 02/12/2021. The pathology ([id number]) confirmed metastatic carcinoma involving nodal tissue.  Cancer Staging Malignant neoplasm of upper-outer quadrant of left breast in female, estrogen receptor positive ([place]) Staging form: Breast, AJCC 8th Edition - Clinical stage from 02/13/2021: Stage IIIA (cT2, cN1, cM0, G3, ER+, PR-, HER2-) - Signed by [name], MD on 02/13/2021 Stage prefix: Initial diagnosis Histologic grading system: 3 grade system Laterality: Left Staged by: Pathologist and managing physician Stage used in treatment planning: Yes National guidelines used in treatment planning: Yes Type of national guideline used in treatment planning: NCCN  The patient's subsequent history is as detailed below.   INTERVAL HISTORY: Latrina "T" was evaluated in the multidisciplinary breast cancer clinic on 02/13/2021 . Her case was also presented at the multidisciplinary breast cancer conference on the same day. At that time a preliminary plan was proposed: Consider neoadjuvant chemotherapy, consider breast conserving surgery then radiation, genetics   REVIEW OF SYSTEMS: On the provided questionnaire, T reports loss of sleep, pain to her left breast, arm, and shoulder, chest pain, shortness of breath with walking and climbing stairs, nausea/vomiting the last two days, heartburn, anxiety, depression, and a history of anemia. The patient denies unusual headaches, visual changes, stiff neck, dizziness, or gait imbalance. There has been no cough, phlegm production, or pleurisy, and no change in bowel or bladder habits. The patient denies fever, rash, bleeding, unexplained fatigue or unexplained weight loss. A detailed review of systems was otherwise entirely negative.   COVID 19 VACCINATION STATUS:    PAST MEDICAL HISTORY: Past Medical History:   Diagnosis Date   Allergy    seasonal    Anemia    Diabetes mellitus without  complication (HCC)    Gestational   IBS (irritable bowel syndrome)     PAST SURGICAL HISTORY: Past Surgical History:  Procedure Laterality Date   BILATERAL SALPINGECTOMY Bilateral 2014   emergency   LEFT OOPHORECTOMY Left 2006    FAMILY HISTORY: Family History  Problem Relation Age of Onset   Diabetes Mother    Hypertension Mother    Diabetes Father    Heart disease Father        4V CABG 03/2016, age 58   Hyperlipidemia Father    Hypertension Father    Diabetes Sister    Hypertension Sister    Breast cancer Maternal Aunt        dx. 57s   Hypertension Maternal Grandmother    Colon cancer Maternal Grandmother        dx. >50   Hypertension Maternal Grandfather    Hypertension Paternal Grandmother    Hypertension Paternal Grandfather    Lung cancer Paternal Grandfather    Her parents are both living-- her mother at age 54 and her father at age 80 as of 01/2021. T has one brother and two sisters. She reports breast cancer in a maternal aunt in her 45's, lung cancer in her paternal grandfather (tobacco user), and colon cancer in her maternal grandmother and a maternal great-aunt (mother's aunt). Of note, one of her sisters underwent genetic testing for keloids, and results were negative.   GYNECOLOGIC HISTORY:  Patient's last menstrual period was 01/16/2021. Menarche: 45 years old Age at first live birth: 45 years old [place] P 3 LMP 01/30/2021, irregular and heavy Contraceptive: never used (not currently active) HRT n/a  Hysterectomy? no BSO? 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 She is currently attending classes on Mon, Wed, and Thurs. She is single. She lives at home with son Otho Ket, age 27. Son Michaela Corner, age 63, is a Training and development officer in [place], [place]. Son Royal Ree Edman., age 45, is a Herbalist in [place], [place]. T  has two grandchildren, a girl age 27 and a boy age 61, living in [place], [place]: not in place; will likely name her mother, Chelsie Burel, as her HCPOA. She can be reached at [phone]   HEALTH MAINTENANCE: Social History   Tobacco Use   Smoking status: Never   Smokeless tobacco: Never  Substance Use Topics   Alcohol use: Yes    Alcohol/week: 5.0 standard drinks    Types: 2 Standard drinks or equivalent, 1 Shots of liquor, 1 Cans of beer, 1 Glasses of wine per week   Drug use: No     Colonoscopy: yes, performed with [place] [place] [facility]  PAP: 06/2020  Bone density: never done (age)   No Known Allergies  Current Outpatient Medications  Medication Sig Dispense Refill   acetaminophen (TYLENOL) 325 MG tablet Take 650 mg by mouth every 6 (six) hours as needed.     ibuprofen (ADVIL) 800 MG tablet Take by mouth.     LINACLOTIDE PO Take 72 mcg by mouth every other day.     ciprofloxacin (CIPRO) 500 MG tablet Take 1 tablet (500 mg total) by mouth 2 (two) times daily. (Patient not taking: No sig reported) 14 tablet 0   dicyclomine (BENTYL) 10 MG capsule Take 10 mg by mouth 4 (four) times daily -  before meals and at bedtime. (Patient not taking: Reported on 02/13/2021)  lubiprostone (AMITIZA) 24 MCG capsule Take 24 mcg by mouth 2 (two) times daily with a meal. (Patient not taking: Reported on 02/13/2021)     Melatonin 3 MG TABS Take 1 tablet (3 mg total) by mouth at bedtime. (Patient not taking: Reported on 02/13/2021) 60 tablet 0   metroNIDAZOLE (FLAGYL) 500 MG tablet Take 1 tablet (500 mg total) by mouth 3 (three) times daily. (Patient not taking: Reported on 02/13/2021) 21 tablet 0   polyethylene glycol powder (GLYCOLAX/MIRALAX) powder Take 17 g by mouth 2 (two) times daily as needed. (Patient not taking: Reported on 02/13/2021) 578 g 0   No current facility-administered medications for this visit.    OBJECTIVE: African-American woman who appears younger than stated  age  77:   02/13/21 0855  BP: (!) 146/70  Pulse: 79  Resp: 18  Temp: 97.9 F (36.6 C)  SpO2: 100%     Body mass index is 26.81 kg/m.   Wt Readings from Last 3 Encounters:  02/13/21 192 lb 3.2 oz (87.2 kg)  06/30/19 183 lb (83 kg)  11/06/16 171 lb 12.8 oz (77.9 kg)      ECOG FS:1 - Symptomatic but completely ambulatory  Ocular: Sclerae unicteric, pupils round and equal Ear-nose-throat: Wearing a mask Lymphatic: No cervical or supraclavicular adenopathy Lungs no rales or rhonchi Heart regular rate and rhythm Abd soft, nontender, positive bowel sounds MSK no focal spinal tenderness, no joint edema Neuro: non-focal, well-oriented, appropriate affect Breasts: The right breast is status post recent biopsy.  There are no findings of concern otherwise.  The left breast is also status post recent biopsy.  There is a palpable mass in the left upper outer quadrant but I do not see skin involvement or nipple involvement.  I do not palpate a mass in either axilla   LAB RESULTS:  CMP     Component Value Date/Time   NA 138 02/13/2021 0830   NA 138 08/20/2016 1712   K 4.0 02/13/2021 0830   CL 106 02/13/2021 0830   CO2 23 02/13/2021 0830   GLUCOSE 96 02/13/2021 0830   BUN 14 02/13/2021 0830   BUN 11 08/20/2016 1712   CREATININE 0.88 02/13/2021 0830   CALCIUM 9.1 02/13/2021 0830   PROT 7.0 02/13/2021 0830   PROT 6.9 08/20/2016 1712   ALBUMIN 3.5 02/13/2021 0830   ALBUMIN 4.0 08/20/2016 1712   AST 19 02/13/2021 0830   ALT 12 02/13/2021 0830   ALKPHOS 93 02/13/2021 0830   BILITOT 0.3 02/13/2021 0830   GFRNONAA >60 02/13/2021 0830   GFRAA 88 08/20/2016 1712    No results found for: TOTALPROTELP, ALBUMINELP, A1GS, A2GS, BETS, BETA2SER, GAMS, MSPIKE, SPEI  Lab Results  Component Value Date   WBC 8.0 02/13/2021   NEUTROABS 5.0 02/13/2021   HGB 8.7 (L) 02/13/2021   HCT 29.1 (L) 02/13/2021   MCV 71.1 (L) 02/13/2021   PLT 401 (H) 02/13/2021    No results found for:  LABCA2  No components found for: ZYSAYT016  No results for input(s): INR in the last 168 hours.  No results found for: LABCA2  No results found for: WFU932  No results found for: TFT732  No results found for: KGU542  No results found for: CA2729  No components found for: HGQUANT  No results found for: CEA1 / No results found for: CEA1   No results found for: AFPTUMOR  No results found for: CHROMOGRNA  No results found for: KPAFRELGTCHN, LAMBDASER, KAPLAMBRATIO (kappa/lambda light chains)  No  results found for: HGBA, HGBA2QUANT, HGBFQUANT, HGBSQUAN (Hemoglobinopathy evaluation)   No results found for: LDH  Lab Results  Component Value Date   IRON 16 (L) 06/30/2019   TIBC 385 06/30/2019   IRONPCTSAT 4 (LL) 06/30/2019   (Iron and TIBC)  Lab Results  Component Value Date   FERRITIN 18 06/30/2019    Urinalysis    Component Value Date/Time   LABSPEC >=1.030 04/22/2019 1307   PHURINE 5.5 04/22/2019 1307   GLUCOSEU NEGATIVE 04/22/2019 1307   HGBUR NEGATIVE 04/22/2019 1307   BILIRUBINUR negative 06/30/2019 1343   KETONESUR negative 06/30/2019 1343   KETONESUR NEGATIVE 04/22/2019 1307   PROTEINUR negative 06/30/2019 1343   PROTEINUR NEGATIVE 04/22/2019 1307   UROBILINOGEN 0.2 06/30/2019 1343   UROBILINOGEN 0.2 04/22/2019 1307   NITRITE Negative 06/30/2019 1343   NITRITE NEGATIVE 04/22/2019 1307   LEUKOCYTESUR Trace (A) 06/30/2019 1343   LEUKOCYTESUR NEGATIVE 04/22/2019 1307     STUDIES: US BREAST LTD UNI LEFT INC AXILLA  Result Date: 02/07/2021 CLINICAL DATA:  45 year old female presenting for 1 week follow-up of a probable left breast abscess. At the last appointment aspiration of the abscess was performed and patient was placed on a second course of antibiotics. Unfortunately the patient has been unable to tolerate the doxycycline (vomiting). She reports persistent lump and pain in the left breast. EXAM: ULTRASOUND OF THE LEFT BREAST COMPARISON:  Previous  exam(s). FINDINGS: On physical exam, I feel a discrete mass in the upper outer left breast with associated warmth and erythema. Targeted ultrasound is performed in the left breast at 2 o'clock 5 cm from the nipple demonstrating an irregular heterogeneous mass measuring 3.9 x 3.7 by 3.8 cm, previously measuring 4.1 x 3.5 x 3.4 cm. IMPRESSION: Persistent heterogeneous mass in the left breast at 2 o'clock which may represent an abscess. RECOMMENDATION: Attempt left breast aspiration today. If the mass can not be aspirated recommend core needle sampling. I have discussed the findings and recommendations with the patient. BI-RADS CATEGORY  3: Probably benign. Electronically Signed   By: Audie Pinto M.D.   On: 02/07/2021 14:54  US BREAST LTD UNI LEFT INC AXILLA  Result Date: 01/30/2021 CLINICAL DATA:  The patient recently had pain, erythema, warmth to the touch, and skin thickening on the left thought to represent infection. The patient was treated with 10 days of Augmentin with significant improvement. However, significant symptoms remain. Specifically, the patient has a painful lump in the upper outer left breast. The patient has a history of breast cysts identified in July of 2018. EXAM: DIGITAL DIAGNOSTIC BILATERAL MAMMOGRAM WITH TOMOSYNTHESIS AND CAD; ULTRASOUND RIGHT BREAST LIMITED; ULTRASOUND LEFT BREAST LIMITED TECHNIQUE: Bilateral digital diagnostic mammography and breast tomosynthesis was performed. The images were evaluated with computer-aided detection.; Targeted ultrasound examination of the right breast was performed; Targeted ultrasound examination of the left breast was performed. COMPARISON:  Previous exam(s). ACR Breast Density Category c: The breast tissue is heterogeneously dense, which may obscure small masses. FINDINGS: There is a mass in the upper right breast at a posterior depth. There is a possible mass located just lateral to the dominant mass only seen on the cc view. No other  mammographic findings are seen on the right. There is skin and trabecular thickening on the left. The patient is palpating a mass in the upper outer left breast which is new since 2018. Cysts seen at 12 o'clock in the left breast in 2018 have resolved. A mass in the inferior medial left breast is  new in the interval. No other suspicious findings in either breast. On physical exam, there is mild warmth to the touch on the left. A palpable lump is identified in the upper-outer left breast. Targeted ultrasound is performed, showing a dominant simple cyst at 1 o'clock in the right breast correlating with the dominant mass seen mammographically. Cysts are also seen at 2 o'clock and 11 o'clock correlating with the other mammographic findings. There is an irregular solid appearing mass at 3 o'clock, 1 cm from the nipple measuring 9 x 8 by 14 mm. The right axilla was not examined. Multiple cysts are seen in the left breast including at 2 o'clock and 8 o'clock correlating with mammographic findings. The dominant mass in the left breast seen on mammogram correlates with a hypoechoic mass at 2 o'clock, 5 cm from the nipple measuring 4.1 x 3.5 x 3.4 cm. This represents the patient's palpable lump. There is increased through transmission. Three abnormal nodes are seen in the right axilla with thickened cortices. A representative node demonstrates a cortex of 17 mm. IMPRESSION: 1. Bilateral fibrocystic changes. 2. Irregular solid mass in the right breast at 3 o'clock, 1 cm from the nipple. The axilla was inadvertently not imaged. 3. Indeterminate mass in the left breast at 2 o'clock, 5 cm from the nipple. This could represent an abscess or a solid mass. 4. Three abnormal nodes in the left axilla. RECOMMENDATION: Recommend aspiration versus biopsy of the 2 o'clock left breast mass. If this mass does not aspirate and requires biopsy, recommend biopsy of 1 of the 3 abnormal left axillary lymph nodes. If the mass does aspirate and is  thought to represent an abscess, recommend follow-up of the abnormal left axillary nodes. Recommend biopsy of the mass in the right breast at 3 o'clock, 1 cm from the nipple. Recommend ultrasound of the right axilla at the time of this biopsy. I have discussed the findings and recommendations with the patient. If applicable, a reminder letter will be sent to the patient regarding the next appointment. BI-RADS CATEGORY  4: Suspicious. Electronically Signed   By: Dorise Bullion III M.D.   On: 01/30/2021 18:12  US BREAST LTD UNI RIGHT INC AXILLA  Result Date: 01/30/2021 CLINICAL DATA:  The patient recently had pain, erythema, warmth to the touch, and skin thickening on the left thought to represent infection. The patient was treated with 10 days of Augmentin with significant improvement. However, significant symptoms remain. Specifically, the patient has a painful lump in the upper outer left breast. The patient has a history of breast cysts identified in July of 2018. EXAM: DIGITAL DIAGNOSTIC BILATERAL MAMMOGRAM WITH TOMOSYNTHESIS AND CAD; ULTRASOUND RIGHT BREAST LIMITED; ULTRASOUND LEFT BREAST LIMITED TECHNIQUE: Bilateral digital diagnostic mammography and breast tomosynthesis was performed. The images were evaluated with computer-aided detection.; Targeted ultrasound examination of the right breast was performed; Targeted ultrasound examination of the left breast was performed. COMPARISON:  Previous exam(s). ACR Breast Density Category c: The breast tissue is heterogeneously dense, which may obscure small masses. FINDINGS: There is a mass in the upper right breast at a posterior depth. There is a possible mass located just lateral to the dominant mass only seen on the cc view. No other mammographic findings are seen on the right. There is skin and trabecular thickening on the left. The patient is palpating a mass in the upper outer left breast which is new since 2018. Cysts seen at 12 o'clock in the left breast  in 2018 have resolved. A  mass in the inferior medial left breast is new in the interval. No other suspicious findings in either breast. On physical exam, there is mild warmth to the touch on the left. A palpable lump is identified in the upper-outer left breast. Targeted ultrasound is performed, showing a dominant simple cyst at 1 o'clock in the right breast correlating with the dominant mass seen mammographically. Cysts are also seen at 2 o'clock and 11 o'clock correlating with the other mammographic findings. There is an irregular solid appearing mass at 3 o'clock, 1 cm from the nipple measuring 9 x 8 by 14 mm. The right axilla was not examined. Multiple cysts are seen in the left breast including at 2 o'clock and 8 o'clock correlating with mammographic findings. The dominant mass in the left breast seen on mammogram correlates with a hypoechoic mass at 2 o'clock, 5 cm from the nipple measuring 4.1 x 3.5 x 3.4 cm. This represents the patient's palpable lump. There is increased through transmission. Three abnormal nodes are seen in the right axilla with thickened cortices. A representative node demonstrates a cortex of 17 mm. IMPRESSION: 1. Bilateral fibrocystic changes. 2. Irregular solid mass in the right breast at 3 o'clock, 1 cm from the nipple. The axilla was inadvertently not imaged. 3. Indeterminate mass in the left breast at 2 o'clock, 5 cm from the nipple. This could represent an abscess or a solid mass. 4. Three abnormal nodes in the left axilla. RECOMMENDATION: Recommend aspiration versus biopsy of the 2 o'clock left breast mass. If this mass does not aspirate and requires biopsy, recommend biopsy of 1 of the 3 abnormal left axillary lymph nodes. If the mass does aspirate and is thought to represent an abscess, recommend follow-up of the abnormal left axillary nodes. Recommend biopsy of the mass in the right breast at 3 o'clock, 1 cm from the nipple. Recommend ultrasound of the right axilla at the time  of this biopsy. I have discussed the findings and recommendations with the patient. If applicable, a reminder letter will be sent to the patient regarding the next appointment. BI-RADS CATEGORY  4: Suspicious. Electronically Signed   By: Dorise Bullion III M.D.   On: 01/30/2021 18:12  MM DIAG BREAST TOMO BILATERAL  Result Date: 01/30/2021 CLINICAL DATA:  The patient recently had pain, erythema, warmth to the touch, and skin thickening on the left thought to represent infection. The patient was treated with 10 days of Augmentin with significant improvement. However, significant symptoms remain. Specifically, the patient has a painful lump in the upper outer left breast. The patient has a history of breast cysts identified in July of 2018. EXAM: DIGITAL DIAGNOSTIC BILATERAL MAMMOGRAM WITH TOMOSYNTHESIS AND CAD; ULTRASOUND RIGHT BREAST LIMITED; ULTRASOUND LEFT BREAST LIMITED TECHNIQUE: Bilateral digital diagnostic mammography and breast tomosynthesis was performed. The images were evaluated with computer-aided detection.; Targeted ultrasound examination of the right breast was performed; Targeted ultrasound examination of the left breast was performed. COMPARISON:  Previous exam(s). ACR Breast Density Category c: The breast tissue is heterogeneously dense, which may obscure small masses. FINDINGS: There is a mass in the upper right breast at a posterior depth. There is a possible mass located just lateral to the dominant mass only seen on the cc view. No other mammographic findings are seen on the right. There is skin and trabecular thickening on the left. The patient is palpating a mass in the upper outer left breast which is new since 2018. Cysts seen at 12 o'clock in the left breast  in 2018 have resolved. A mass in the inferior medial left breast is new in the interval. No other suspicious findings in either breast. On physical exam, there is mild warmth to the touch on the left. A palpable lump is identified  in the upper-outer left breast. Targeted ultrasound is performed, showing a dominant simple cyst at 1 o'clock in the right breast correlating with the dominant mass seen mammographically. Cysts are also seen at 2 o'clock and 11 o'clock correlating with the other mammographic findings. There is an irregular solid appearing mass at 3 o'clock, 1 cm from the nipple measuring 9 x 8 by 14 mm. The right axilla was not examined. Multiple cysts are seen in the left breast including at 2 o'clock and 8 o'clock correlating with mammographic findings. The dominant mass in the left breast seen on mammogram correlates with a hypoechoic mass at 2 o'clock, 5 cm from the nipple measuring 4.1 x 3.5 x 3.4 cm. This represents the patient's palpable lump. There is increased through transmission. Three abnormal nodes are seen in the right axilla with thickened cortices. A representative node demonstrates a cortex of 17 mm. IMPRESSION: 1. Bilateral fibrocystic changes. 2. Irregular solid mass in the right breast at 3 o'clock, 1 cm from the nipple. The axilla was inadvertently not imaged. 3. Indeterminate mass in the left breast at 2 o'clock, 5 cm from the nipple. This could represent an abscess or a solid mass. 4. Three abnormal nodes in the left axilla. RECOMMENDATION: Recommend aspiration versus biopsy of the 2 o'clock left breast mass. If this mass does not aspirate and requires biopsy, recommend biopsy of 1 of the 3 abnormal left axillary lymph nodes. If the mass does aspirate and is thought to represent an abscess, recommend follow-up of the abnormal left axillary nodes. Recommend biopsy of the mass in the right breast at 3 o'clock, 1 cm from the nipple. Recommend ultrasound of the right axilla at the time of this biopsy. I have discussed the findings and recommendations with the patient. If applicable, a reminder letter will be sent to the patient regarding the next appointment. BI-RADS CATEGORY  4: Suspicious. Electronically Signed    By: Dorise Bullion III M.D.   On: 01/30/2021 18:12  [place] AXILLARY NODE CORE BIOPSY LEFT  Result Date: 02/12/2021 CLINICAL DATA:  45 year old female with known left breast cancer presenting for biopsy of a left axillary lymph node. EXAM: [place] AXILLARY NODE CORE BIOPSY LEFT COMPARISON:  Previous exam(s). PROCEDURE: I met with the patient and we discussed the procedure of ultrasound-guided biopsy, including benefits and alternatives. We discussed the high likelihood of a successful procedure. We discussed the risks of the procedure, including infection, bleeding, tissue injury, clip migration, and inadequate sampling. Informed written consent was given. The usual time-out protocol was performed immediately prior to the procedure. Using sterile technique and 1% Lidocaine as local anesthetic, under direct ultrasound visualization, a 14 gauge spring-loaded device was used to perform biopsy of a left axillary lymph node using a lateral approach. At the conclusion of the procedure a tri bell tissue marker clip was deployed into the biopsy cavity. Follow up 2 view mammogram was performed and dictated separately. IMPRESSION: Ultrasound guided biopsy of a left axillary lymph node. No apparent complications. Electronically Signed   By: Audie Pinto M.D.   On: 02/12/2021 15:42  MM CLIP PLACEMENT LEFT  Result Date: 02/12/2021 CLINICAL DATA:  Post procedure mammogram for clip placement. EXAM: 3D DIAGNOSTIC LEFT MAMMOGRAM POST ULTRASOUND BIOPSY COMPARISON:  Previous exam(s). FINDINGS: 3D Mammographic images were obtained following ultrasound guided biopsy of a left axillary lymph node. The tri bell biopsy marking clip is in expected position at the site of biopsy. IMPRESSION: Appropriate positioning of the tribell shaped biopsy marking clip at the site of biopsy in the left axilla. Final Assessment: Post Procedure Mammograms for Marker Placement Electronically Signed   By: Audie Pinto M.D.   On: 02/12/2021  15:46  MM CLIP PLACEMENT RIGHT  Result Date: 01/30/2021 CLINICAL DATA:  Evaluate biopsy marker EXAM: 3D DIAGNOSTIC RIGHT MAMMOGRAM POST ULTRASOUND BIOPSY COMPARISON:  Previous exam(s). FINDINGS: 3D Mammographic images were obtained following ultrasound guided biopsy of a 3 o'clock right breast mass. The biopsy marking clip is in expected position at the site of biopsy. IMPRESSION: Appropriate positioning of the ribbon shaped biopsy marking clip at the site of biopsy in the region of the biopsied 3 o'clock right breast mass. Final Assessment: Post Procedure Mammograms for Marker Placement Electronically Signed   By: Dorise Bullion III M.D.   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 This was found to be concordant by Dr. Audie Pinto. Pathology results were discussed with the patient by telephone by Stacie Acres, RN Nurse Navigator. The patient reported doing well after the biopsy with tenderness at the site. Post biopsy instructions and care were reviewed and questions were answered. The patient was encouraged to call [facility] for any additional concerns. The patient was referred to [facility] on February 13, 2021. The patient is scheduled for a Left axillary lymph node biopsy on February 12, 2021. Further recommendations will be guided by the results of this biopsy. Recommendation for a bilateral breast MRI to exclude any additional sites of disease given heterogeneously dense breast tissue and age. Pathology results reported by Terie Purser, RN on 02/11/2021. Electronically Signed   By: Audie Pinto M.D.   On: 02/11/2021 10:25   Result Date: 02/11/2021 CLINICAL DATA:  45 year old  female presenting for ultrasound-guided aspiration/possible biopsy of a left breast mass. EXAM: ULTRASOUND GUIDED LEFT BREAST CORE NEEDLE BIOPSY COMPARISON:  Previous exam(s). PROCEDURE: I met with the patient and we discussed the procedure of ultrasound-guided biopsy, including benefits and alternatives. We discussed the high likelihood of a successful procedure. We discussed the risks of the procedure, including infection, bleeding, tissue injury, clip migration, and inadequate sampling. Informed written consent was given. The usual time-out protocol was performed immediately prior to the procedure. Lesion quadrant: Upper outer quadrant Using sterile technique and 1% Lidocaine as local anesthetic, under direct ultrasound visualization, aspiration of the left breast mass at 2 o'clock was attempted. Approximately 3 mm of bloody fluid was aspirated. Since minimal fluid could be obtained, the procedure was converted to biopsy. Under direct ultrasound visualization a 14 gauge spring-loaded device was used to perform biopsy of the mass in the left breast at 2 o'clock using a lateral approach. At the conclusion of the procedure a coil tissue marker clip was deployed into the biopsy cavity. IMPRESSION: 1. Ultrasound guided biopsy of a left breast mass at 2 o'clock. No apparent complications. 2. The patient's antibiotic will be switched while pathology is pending. She was given a prescription for Augmentin 875 mg  b.i.d. times 10 days. Electronically Signed: By: Audie Pinto M.D. On: 02/07/2021 14:57  [place] RT BREAST BX W LOC DEV 1ST LESION IMG BX SPEC US GUIDE  Addendum Date: 02/01/2021   ADDENDUM REPORT: 01/31/2021 11:18 ADDENDUM: Pathology revealed PREDOMINANTLY FATTY TISSUE WITH FOCAL BENIGN BREAST TISSUE of the RIGHT breast, 3 o'clock, 1cmfn, ribbon clip. A distinct lesion is not seen. This was found to be concordant by Dr. Dorise Bullion. Pathology results were discussed with the patient by telephone. The  patient reported doing well after the biopsy with tenderness at the site. Post biopsy instructions and care were reviewed and questions were answered. The patient was encouraged to call [facility] for any additional concerns. The patient was instructed to return for annual screening mammography due September 2023 and informed a reminder notice would be sent regarding this appointment. Pathology results reported by Stacie Acres RN on 01/31/2021. Electronically Signed   By: Dorise Bullion III M.D.   On: 01/31/2021 11:18   Result Date: 02/01/2021 CLINICAL DATA:  Biopsy of a 3 o'clock right breast mass. EXAM: ULTRASOUND GUIDED RIGHT BREAST CORE NEEDLE BIOPSY COMPARISON:  Previous exam(s). PROCEDURE: I met with the patient and we discussed the procedure of ultrasound-guided biopsy, including benefits and alternatives. We discussed the high likelihood of a successful procedure. We discussed the risks of the procedure, including infection, bleeding, tissue injury, clip migration, and inadequate sampling. Informed written consent was given. The usual time-out protocol was performed immediately prior to the procedure. Lesion quadrant: 3 o'clock right breast Using sterile technique and 1% Lidocaine as local anesthetic, under direct ultrasound visualization, a 12 gauge spring-loaded device was used to perform biopsy of a 3 o'clock right breast mass using a medial approach. At the conclusion of the procedure a ribbon shaped tissue marker clip was deployed into the biopsy cavity. Follow up 2 view mammogram was performed and dictated separately. IMPRESSION: Ultrasound guided biopsy of a 3 o'clock right breast mass. No apparent complications. Electronically Signed: By: Dorise Bullion III M.D. On: 01/30/2021 18:19  US BREAST ASPIRATION LEFT  Addendum Date: 02/08/2021   ADDENDUM REPORT: 02/07/2021 15:16 ADDENDUM: LEFT breast aspiration yielded Gram Stain-FEW WBC PRESENT, PREDOMINANTLY MONONUCLEAR,  NO ORGANISMS SEEN. Culture-No growth aerobically or anaerobically. The patient was notified of results by Dr. Audie Pinto on February 07, 2021 at the time of LEFT breast ultrasound and biopsy. These procedures are dictated in separate reports. The patient reported area not improving and taking antibiotics as prescribed. Further recommendations will be guided by the results of the biopsy performed on February 07, 2021. Pathology results reported by Terie Purser, RN on 02/07/2021. Electronically Signed   By: Dorise Bullion III M.D.   On: 02/07/2021 15:16   Result Date: 02/08/2021 CLINICAL DATA:  The patient presents for aspiration versus biopsy of a left breast mass. EXAM: ULTRASOUND GUIDED LEFT BREAST POSSIBLE ABSCESS ASPIRATION COMPARISON:  Previous exams. PROCEDURE: The patient and I discussed the procedure of ultrasound-guided aspiration including benefits and alternatives. We discussed the high likelihood of a successful procedure. We discussed the risks of the procedure including infection, bleeding, tissue injury, and inadequate sampling. Informed written consent was given. The usual time out protocol was performed immediately prior to the procedure. Using sterile technique, 1% lidocaine, under direct ultrasound visualization, needle aspiration of a possible abscess was performed. 20 cc of bloody fluid was aspirated from the possible abscess. Both the numbing needle and the introducer used for aspiration moved easily in the  mass and the mass is thought to represent an abscess. IMPRESSION: Ultrasound-guided aspiration of a probable left breast abscess. No apparent complications. RECOMMENDATIONS: The patient was given a 14 day course of doxycycline. The patient should follow-up in 1 week for ultrasound of the left breast probable abscess and the probable reactive left axillary nodes. The findings should be followed to complete resolution. Once the patient's symptoms have resolved, a final follow-up  ultrasound and mammogram should be performed on the left. If the 2 o'clock left breast mass does not resolve, recommend biopsy. If the abnormal nodes do not return to normal, recommend biopsy. Electronically Signed: By: Dorise Bullion III M.D. On: 01/30/2021 18:18    ELIGIBLE FOR AVAILABLE RESEARCH PROTOCOL: no  ASSESSMENT: 45 y.o. [place], [place] woman status post left breast upper outer quadrant biopsy 02/07/2021 for a clinical T2 Ni, stage IIIA invasive ductal carcinoma, grade 3, estrogen receptor weakly positive, progesterone receptor and HER2 negative, with an MIB-1 of 40%.  (1) genetics testing  (2) neoadjuvant chemotherapy will consist of paclitaxel weekly x12 followed by dose dense doxorubicin and cyclophosphamide x4  (3) definitive surgery to follow  (4) adjuvant radiation as appropriate  (5) antiestrogens.  PLAN: I met today with Abigayl to review her new diagnosis. Specifically we discussed the biology of her breast cancer, its diagnosis, staging, treatment  options and prognosis. We first reviewed the fact that cancer is not one disease but more than 100 different diseases and that it is important to keep them separate-- otherwise when friends and relatives discuss their own cancer experiences with Maley confusion can result. Similarly we explained that if breast cancer spreads to the bone or liver, the patient would not have bone cancer or liver cancer, but breast cancer in the bone and breast cancer in the liver: one cancer in three places-- not 3 different cancers which otherwise would have to be treated in 3 different ways.  We discussed the difference between local and systemic therapy. In terms of loco-regional treatment, lumpectomy plus radiation is equivalent to mastectomy as far as survival is concerned. For this reason, and because the cosmetic results are generally superior, we recommend breast conserving surgery.   We also noted that in terms of sequencing of treatments,  whether systemic therapy or surgery is done first does not affect the ultimate outcome.  This is relevant to T's case, since we believe both for clinical and technical reasons she will do better with neoadjuvant chemotherapy.  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 There is some risk that this cancer may have already spread to other parts of her body.  We will check a CT of the chest and a bone scan on the stage III patient and I expect those scans to be negative.  However scans cannot answer the question the patient really would like to know, which is whether she has microscopic disease elsewhere in her body.  She needs systemic treatment.  Next we went over the options for systemic therapy which are anti-estrogens, anti-HER-2 immunotherapy, and chemotherapy. T does not meet criteria for anti-HER-2 immunotherapy. She is a candidate for anti-estrogens but with a weak estrogen receptor level and negative progesterone receptor she clearly needs chemotherapy.  We discussed standard AC/T.  In her case it would  be best if she could be treated initially with Taxol.  If she is treated on a Friday she should be able to get back to school for her classes on Monday and Wednesday and Thursday.  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  She can then proceed to the more intense Adriamycin/Cytoxan dose dense treatments in 2023 followed by definitive surgery.  She is very much in agreement with this plan.  Our tentative treatment start date is 03/01/2021.  The patient understands that I am not here on Fridays but my nurse practitioner is and she will oversee her treatments over the  next several weeks.  Total encounter time 65 minutes.Sarajane Jews C. Michel Hendon, MD 02/13/2021 5:32 PM Medical Oncology and Hematology [facility] [place], [place] [address] Tel. [phone]    Fax. [phone]   This document serves as a record of services personally performed by Lurline Del, MD. It was created on his behalf by Wilburn Mylar, a trained medical scribe. The creation of this record is based on the scribe's personal observations and the provider's statements to them.   I, Lurline Del MD, have reviewed the above documentation for accuracy and completeness, and I agree with the above.    *Total Encounter Time as defined by the Centers for Medicare and Medicaid Services includes, in addition to the face-to-face time of a patient visit (documented in the note above) non-face-to-face time: obtaining and reviewing outside history, ordering and reviewing medications, tests or procedures, care coordination (communications with other health care professionals or caregivers) and documentation in the medical record.

## 2021-02-13 NOTE — Patient Instructions (Signed)

## 2021-02-13 NOTE — Progress Notes (Signed)
[place] Work  Initial Assessment   Catherine Ellison is a 45 y.o. year old female presenting alone. Social Work was referred by [facility] for assessment of psychosocial needs.   SDOH (Social Determinants of Health) assessments performed: Yes SDOH Interventions    Flowsheet Row Most Recent Value  SDOH Interventions   Food Insecurity Interventions Intervention Not Indicated  Financial Strain Interventions --  [SW Intern sent information to pt in the mail about grant applications/ financial resources.]  Housing Interventions Intervention Not Indicated  Transportation Interventions Intervention Not Indicated       Distress Screen completed: Yes ONCBCN DISTRESS SCREENING 02/13/2021  Screening Type Initial Screening  Distress experienced in past week (1-10) 9  Practical problem type Housing;Work/school  Family Problem type Children  Emotional problem type Depression;Nervousness/Anxiety  Spiritual/Religous concerns type Loss of sense of purpose  Information Concerns Type Lack of info about diagnosis;Lack of info about treatment  Physical Problem type Pain;Sleep/insomnia;Constipation/diarrhea;Sexual problems      Family/Social Information:  Housing Arrangement: patient lives with 56 year old son.  Family members/support persons in your life? 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 Two sisters, mother and boyfriend all live nearby. Pt is not revealing diagnosis to mother yet since she is currently dealing with her own medical challenges.  Transportation concerns: no  Employment: Working full time. Income source: Employment Financial concerns: Yes, due to illness and/or loss of work during treatment Type of concern: Utilities/ Rent/ Mortgage in the event of lost time at work during treatment. Food access concerns: no Religious or spiritual practice: yes, loss of sense of purpose Medication Concerns: no   Coping/ Adjustment to  diagnosis: Patient understands treatment plan and what happens next? yes Concerns about diagnosis and/or treatment: Pain or discomfort during procedures and How I will care for other members of my family Patient reported stressors: Housing, Work/ school, Children, Depression, Anxiety, Isolation/ feeling alone, and Loss of sense of purpose. Pt states that her three children are very worried about her because she is their only parent. Youngest son who lives at home lost father to diabetes and is particularly concerned.  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   Clinical Social Work Clinical Goal(s):  explore community resource options for unmet needs related LS:[id number] constraints and emotional resources for teens.   Interventions: Discussed common feeling and emotions when being diagnosed with cancer, and the importance of support during treatment Informed patient of the support team roles and support services at [facility] Provided [place] contact information and encouraged patient to call with any questions or concerns Provided patient with information about [place], and [employer], as well as resources for talking to children about cancer.   Follow Up Plan: SW Intern will follow up with patient by phone. Patient verbalizes understanding of plan: Yes   Rosary Lively, BSW Intern Supervised by: Kennith Center , LCSW

## 2021-02-14 ENCOUNTER — Other Ambulatory Visit: Payer: Self-pay | Admitting: *Deleted

## 2021-02-14 HISTORY — PX: BREAST BIOPSY: SHX20

## 2021-02-19 ENCOUNTER — Other Ambulatory Visit: Payer: Self-pay

## 2021-02-19 ENCOUNTER — Inpatient Hospital Stay: Payer: BC Managed Care – PPO | Attending: Oncology

## 2021-02-19 ENCOUNTER — Ambulatory Visit (HOSPITAL_COMMUNITY)
Admission: RE | Admit: 2021-02-19 | Discharge: 2021-02-19 | Disposition: A | Discharge status: Home / Self Care | Payer: BC Managed Care – PPO | Source: Ambulatory Visit | Attending: Oncology | Admitting: Oncology

## 2021-02-19 ENCOUNTER — Encounter: Payer: Self-pay | Admitting: Oncology

## 2021-02-19 DIAGNOSIS — Z01818 Encounter for other preprocedural examination: Secondary | ICD-10-CM | POA: Diagnosis not present

## 2021-02-19 DIAGNOSIS — C773 Secondary and unspecified malignant neoplasm of axilla and upper limb lymph nodes: Secondary | ICD-10-CM | POA: Insufficient documentation

## 2021-02-19 DIAGNOSIS — Z5111 Encounter for antineoplastic chemotherapy: Secondary | ICD-10-CM | POA: Insufficient documentation

## 2021-02-19 DIAGNOSIS — C50412 Malignant neoplasm of upper-outer quadrant of left female breast: Secondary | ICD-10-CM | POA: Insufficient documentation

## 2021-02-19 DIAGNOSIS — Z170 Estrogen receptor positive status [ER+]: Secondary | ICD-10-CM | POA: Insufficient documentation

## 2021-02-19 DIAGNOSIS — Z0189 Encounter for other specified special examinations: Secondary | ICD-10-CM

## 2021-02-19 LAB — ECHOCARDIOGRAM COMPLETE
Area-P 1/2: 2.63 cm2
S' Lateral: 3.1 cm

## 2021-02-19 NOTE — Progress Notes (Signed)
Met with patient at registration to introduce myself as Arboriculturist and to offer available resources.  Discussed one-time $1000 Radio broadcast assistant to assist with personal expenses while going through treatment. Advised what is needed to apply and she will bring 10/14.  Gave her my card for any additional financial questions or concerns.

## 2021-02-19 NOTE — Progress Notes (Signed)
Echocardiogram 2D Echocardiogram has been performed.  Oneal Deputy Eustace Hur RDCS 02/19/2021, 9:58 AM

## 2021-02-20 ENCOUNTER — Telehealth: Payer: Self-pay | Admitting: Genetic Counselor

## 2021-02-20 ENCOUNTER — Encounter: Payer: Self-pay | Admitting: Genetic Counselor

## 2021-02-20 DIAGNOSIS — Z1379 Encounter for other screening for genetic and chromosomal anomalies: Secondary | ICD-10-CM | POA: Insufficient documentation

## 2021-02-20 NOTE — Telephone Encounter (Signed)
I contacted Catherine Ellison to discuss her genetic testing results. No pathogenic variants were identified in the 8 genes analyzed. 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 We are still awaiting the results for an additional 39 genes and will contact Catherine Ellison once they are available.   The test report has been scanned into EPIC and is located under the Molecular Pathology section of the Results Review tab.  A portion of the result report is included below for reference. Detailed clinic note to follow.  Lucille Passy, MS, [facility] Genetic Counselor [email or web address] (P) [phone]

## 2021-02-21 ENCOUNTER — Encounter: Payer: Self-pay | Admitting: *Deleted

## 2021-02-21 ENCOUNTER — Encounter (HOSPITAL_BASED_OUTPATIENT_CLINIC_OR_DEPARTMENT_OTHER): Payer: Self-pay | Admitting: Surgery

## 2021-02-21 ENCOUNTER — Telehealth: Payer: Self-pay | Admitting: *Deleted

## 2021-02-21 NOTE — Telephone Encounter (Signed)
Spoke with patient to follow up from [facility] 9/28 and assess navigation needs. Patient denies any needs or concerns at this time. Encouraged her to should anything arise. Patient verbalized understanding.

## 2021-02-22 ENCOUNTER — Ambulatory Visit
Admission: RE | Admit: 2021-02-22 | Discharge: 2021-02-22 | Disposition: A | Discharge status: Home / Self Care | Payer: BC Managed Care – PPO | Source: Ambulatory Visit | Attending: Oncology | Admitting: Oncology

## 2021-02-22 ENCOUNTER — Other Ambulatory Visit: Payer: Self-pay

## 2021-02-22 DIAGNOSIS — Z170 Estrogen receptor positive status [ER+]: Secondary | ICD-10-CM

## 2021-02-22 DIAGNOSIS — C50412 Malignant neoplasm of upper-outer quadrant of left female breast: Secondary | ICD-10-CM

## 2021-02-22 MED ORDER — GADOBUTROL 1 MMOL/ML IV SOLN
8.0000 mL | Freq: Once | INTRAVENOUS | Status: AC | PRN
  Administered 2021-02-22: 8 mL via INTRAVENOUS

## 2021-02-22 NOTE — Progress Notes (Signed)
Pharmacist Chemotherapy Monitoring - Initial Assessment    Anticipated start date: 03/01/21   The following has been reviewed per standard work regarding the patient's treatment regimen: The patient's diagnosis, treatment plan and drug doses, and organ/hematologic function Lab orders and baseline tests specific to treatment regimen  The treatment plan start date, drug sequencing, and pre-medications Prior authorization status  Patient's documented medication list, including drug-drug interaction screen and prescriptions for anti-emetics and supportive care specific to the treatment regimen The drug concentrations, fluid compatibility, administration routes, and timing of the medications to be used The patient's access for treatment and lifetime cumulative dose history, if applicable  The patient's medication allergies and previous infusion related reactions, if applicable   Changes made to treatment plan:  N/A  Follow up needed:  Pending authorization    Larene Beach, [place], 02/22/2021  2:51 PM

## 2021-02-25 ENCOUNTER — Encounter: Payer: Self-pay | Admitting: *Deleted

## 2021-02-25 ENCOUNTER — Telehealth: Payer: Self-pay

## 2021-02-25 ENCOUNTER — Other Ambulatory Visit: Payer: Self-pay

## 2021-02-25 ENCOUNTER — Encounter (HOSPITAL_BASED_OUTPATIENT_CLINIC_OR_DEPARTMENT_OTHER): Payer: Self-pay | Admitting: Surgery

## 2021-02-25 NOTE — Progress Notes (Addendum)
Spoke w/ via phone for pre-op interview---pt Lab needs dos---- urine poct              Lab results------echo 02-19-2021  epic, lov dr Jana Hakim 02-13-2021 epic COVID test -----patient states asymptomatic no test needed Arrive at -------700 am 02-28-2021 NPO after MN NO Solid Food.  Clear liquids from MN until---600 am Med rec completed Medications to take morning of surgery -----gabapentin Diabetic medication -----n/a Patient instructed no nail polish to be worn day of surgery Patient instructed to bring photo id and insurance card day of surgery Patient aware to have Driver (ride ) / caregiver    for 24 hours after surgery boyfriend Catherine Ellison is driver/ sister and boyfriend to switch out dos Patient Special Instructions -----none Pre-Op special Istructions -----none Patient verbalized understanding of instructions that were given at this phone interview. Patient denies shortness of breath, chest pain, fever, cough at this phone interview.   Pt states port a cath will be left accessed due to she starts chemotherapy the day after surgery.  Pt called and wishes to take 800 mg ibuprofen due to menustral cycle started today and is painful, pt instructed to call dr blackmon office and ask if ibuprofen ok to take today.

## 2021-02-25 NOTE — Telephone Encounter (Signed)
Notified Patient of completion of FMLA and Disability Forms. Fax Transmission Confirmation received and Copy of forms placed at Registration for pick-up as requested.

## 2021-02-26 ENCOUNTER — Encounter (HOSPITAL_COMMUNITY): Payer: Self-pay

## 2021-02-26 ENCOUNTER — Other Ambulatory Visit: Payer: BC Managed Care – PPO

## 2021-02-26 ENCOUNTER — Encounter (HOSPITAL_COMMUNITY)
Admission: RE | Admit: 2021-02-26 | Discharge: 2021-02-26 | Disposition: A | Discharge status: Home / Self Care | Payer: BC Managed Care – PPO | Source: Ambulatory Visit | Attending: Oncology | Admitting: Oncology

## 2021-02-26 ENCOUNTER — Other Ambulatory Visit: Payer: Self-pay | Admitting: Oncology

## 2021-02-26 ENCOUNTER — Ambulatory Visit (HOSPITAL_COMMUNITY)
Admission: RE | Admit: 2021-02-26 | Discharge: 2021-02-26 | Disposition: A | Discharge status: Home / Self Care | Payer: BC Managed Care – PPO | Source: Ambulatory Visit | Attending: Oncology | Admitting: Oncology

## 2021-02-26 DIAGNOSIS — C50412 Malignant neoplasm of upper-outer quadrant of left female breast: Secondary | ICD-10-CM

## 2021-02-26 DIAGNOSIS — Z170 Estrogen receptor positive status [ER+]: Secondary | ICD-10-CM | POA: Diagnosis present

## 2021-02-26 MED ORDER — IOHEXOL 350 MG/ML SOLN
60.0000 mL | Freq: Once | INTRAVENOUS | Status: AC | PRN
  Administered 2021-02-26: 60 mL via INTRAVENOUS

## 2021-02-26 MED ORDER — TECHNETIUM TC 99M MEDRONATE IV KIT
20.0000 | PACK | Freq: Once | INTRAVENOUS | Status: AC | PRN
  Administered 2021-02-26: 20.5 via INTRAVENOUS

## 2021-02-27 ENCOUNTER — Encounter: Payer: Self-pay | Admitting: *Deleted

## 2021-02-27 NOTE — H&P (Signed)
REFERRING PHYSICIAN:  Magrinat, Virgie Dad, MD   PROVIDER:  Beverlee Nims, MD   MRN: [id number] DOB: 1975-07-12 DATE OF ENCOUNTER: 02/13/2021 Subjective  Chief Complaint: Breast Cancer     History of Present Illness: Catherine Ellison is a 45 y.o. female who is seen today as an office consultation at the request of Dr. Jana Hakim for evaluation of Breast Cancer  .     This is a 45 year old female who presents breast cancer.  She felt the mass several months ago.  It is now concentrated in the axilla and left arm.  She denies nipple discharge.  She has no previous problems with her breast.  She is otherwise healthy without complaints.  She has no cardiopulmonary issues.   Review of Systems: A complete review of systems was obtained from the patient.  I have reviewed this information and discussed as appropriate with the patient.  See HPI as well for other ROS.   ROS    Medical History:     Past Medical History:  Diagnosis Date   Anemia     Diabetes mellitus without complication (CMS-HCC)           Patient Active Problem List  Diagnosis   Malignant neoplasm of upper-outer quadrant of left breast in female, estrogen receptor positive (CMS-HCC)   Breast lump           Past Surgical History:  Procedure Laterality Date   OOPHORECTOMY Left      2006   SALPINGECTOMY Bilateral      2014      No Known Allergies         Current Outpatient Medications on File Prior to Visit  Medication Sig Dispense Refill   ibuprofen (MOTRIN) 800 MG tablet Take 800 mg by mouth every 8 (eight) hours as needed       dicyclomine (BENTYL) 10 mg capsule Take 10 mg by mouth (Patient not taking: Reported on 02/13/2021)       lubiprostone (AMITIZA) 24 MCG capsule Take by mouth        No current facility-administered medications on file prior to visit.           Family History  Problem Relation Age of Onset   High blood pressure (Hypertension) Mother     Diabetes Mother     High blood  pressure (Hypertension) Father     Hyperlipidemia (Elevated cholesterol) Father     Diabetes Father     Heart disease Father     Diabetes Sister     High blood pressure (Hypertension) Sister     High blood pressure (Hypertension) Maternal Grandmother     Cancer Maternal Grandmother     High blood pressure (Hypertension) Maternal Grandfather     High blood pressure (Hypertension) Paternal Grandmother     High blood pressure (Hypertension) Paternal Grandfather     Cancer Paternal Grandfather        Social History       Tobacco Use  Smoking Status Current Some Day Smoker  Smokeless Tobacco Never Used      Social History         Socioeconomic History   Marital status: Unknown  Tobacco Use   Smoking status: Current Some Day Smoker   Smokeless tobacco: Never Used  Substance and Sexual Activity   Alcohol use: Yes      Comment: socially   Drug use: Never      Objective:  BP 137/86 (BP Location: Left Arm,  Patient Position: Sitting, Cuff Size: Normal)   Pulse 85   Temp 98.3 F (36.8 C) (Temporal)   Ht 5\' 11"  (1.803 m)   Wt 183 lb (83 kg)   LMP 06/16/2019   SpO2 97%   BMI 25.52 kg/m    Physical Exam    She appears well exam   There is a large mass in the left 14th quadrant left breast with palpable adenopathy. there is questionable slight erythema.  The nipple areolar complex is normal   Labs, Imaging and Diagnostic Testing: I reviewed her mammogram and ultrasound as well as pathology results   Assessment and Plan:  Left breast cancer   We discussed her in our multidisciplinary breast conference this morning.  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  Given the size and lymph node involvement, neoadjuvant chemotherapy has been recommended.  Without neoadjuvant therapy, she will require mastectomy.  We discussed the reasons for this with her in detail. She understands and wants to proceed with chemotherapy.  I next discussed Port-A-Cath insertion.   We discussed the risk which includes was not limited to bleeding, infection, pneumothorax, injury to surrounding structures, cardiopulmonary issues, postoperative recovery, etc.  Surgery will be scheduled soon as possible.  Hopes is to start chemotherapy on Octobert 14th

## 2021-02-28 ENCOUNTER — Ambulatory Visit (HOSPITAL_COMMUNITY): Payer: BC Managed Care – PPO

## 2021-02-28 ENCOUNTER — Other Ambulatory Visit: Payer: Self-pay

## 2021-02-28 ENCOUNTER — Ambulatory Visit (HOSPITAL_BASED_OUTPATIENT_CLINIC_OR_DEPARTMENT_OTHER): Payer: BC Managed Care – PPO | Admitting: Anesthesiology

## 2021-02-28 ENCOUNTER — Ambulatory Visit (HOSPITAL_BASED_OUTPATIENT_CLINIC_OR_DEPARTMENT_OTHER)
Admission: RE | Admit: 2021-02-28 | Discharge: 2021-02-28 | Disposition: A | Discharge status: Home / Self Care | Payer: BC Managed Care – PPO | Attending: Surgery | Admitting: Surgery

## 2021-02-28 ENCOUNTER — Encounter (HOSPITAL_BASED_OUTPATIENT_CLINIC_OR_DEPARTMENT_OTHER)
Admission: RE | Disposition: A | Discharge status: Home / Self Care | Payer: Self-pay | Source: Home / Self Care | Attending: Surgery

## 2021-02-28 ENCOUNTER — Encounter (HOSPITAL_BASED_OUTPATIENT_CLINIC_OR_DEPARTMENT_OTHER): Payer: Self-pay | Admitting: Surgery

## 2021-02-28 ENCOUNTER — Encounter: Payer: Self-pay | Admitting: *Deleted

## 2021-02-28 ENCOUNTER — Telehealth: Payer: Self-pay | Admitting: *Deleted

## 2021-02-28 DIAGNOSIS — C50412 Malignant neoplasm of upper-outer quadrant of left female breast: Secondary | ICD-10-CM | POA: Insufficient documentation

## 2021-02-28 DIAGNOSIS — Z452 Encounter for adjustment and management of vascular access device: Secondary | ICD-10-CM | POA: Diagnosis not present

## 2021-02-28 DIAGNOSIS — F17200 Nicotine dependence, unspecified, uncomplicated: Secondary | ICD-10-CM | POA: Insufficient documentation

## 2021-02-28 DIAGNOSIS — Z170 Estrogen receptor positive status [ER+]: Secondary | ICD-10-CM | POA: Insufficient documentation

## 2021-02-28 DIAGNOSIS — Z95828 Presence of other vascular implants and grafts: Secondary | ICD-10-CM

## 2021-02-28 HISTORY — DX: Dyspnea, unspecified: R06.00

## 2021-02-28 HISTORY — DX: Other complications of anesthesia, initial encounter: T88.59XA

## 2021-02-28 HISTORY — DX: Personal history of gestational diabetes: Z86.32

## 2021-02-28 HISTORY — DX: Mild hyperemesis gravidarum: O21.0

## 2021-02-28 HISTORY — PX: PORTACATH PLACEMENT: SHX2246

## 2021-02-28 HISTORY — DX: Malignant (primary) neoplasm, unspecified: C80.1

## 2021-02-28 HISTORY — DX: Gastro-esophageal reflux disease without esophagitis: K21.9

## 2021-02-28 LAB — POCT PREGNANCY, URINE: Preg Test, Ur: NEGATIVE

## 2021-02-28 SURGERY — INSERTION, TUNNELED CENTRAL VENOUS DEVICE, WITH PORT
Anesthesia: General | Site: Chest | Laterality: Right

## 2021-02-28 MED ORDER — CHLORHEXIDINE GLUCONATE CLOTH 2 % EX PADS
6.0000 | MEDICATED_PAD | Freq: Once | CUTANEOUS | Status: DC
Start: 2021-02-28 — End: 2021-02-28

## 2021-02-28 MED ORDER — FENTANYL CITRATE (PF) 100 MCG/2ML IJ SOLN
INTRAMUSCULAR | Status: DC | PRN
  Administered 2021-02-28 (×2): 50 ug via INTRAVENOUS

## 2021-02-28 MED ORDER — LACTATED RINGERS IV SOLN: INTRAVENOUS | Status: DC

## 2021-02-28 MED ORDER — CEFAZOLIN SODIUM-DEXTROSE 2-4 GM/100ML-% IV SOLN
INTRAVENOUS | Status: AC
  Filled 2021-02-28: qty 100

## 2021-02-28 MED ORDER — HEPARIN SOD (PORK) LOCK FLUSH 100 UNIT/ML IV SOLN
INTRAVENOUS | Status: DC | PRN
  Administered 2021-02-28: 500 [IU]

## 2021-02-28 MED ORDER — SODIUM CHLORIDE 0.9 % IV SOLN
Freq: Once | INTRAVENOUS | Status: AC
  Administered 2021-02-28: 500 mL
  Filled 2021-02-28: qty 1

## 2021-02-28 MED ORDER — CHLORHEXIDINE GLUCONATE CLOTH 2 % EX PADS: 6.0000 | MEDICATED_PAD | Freq: Once | CUTANEOUS | Status: DC

## 2021-02-28 MED ORDER — MIDAZOLAM HCL 5 MG/5ML IJ SOLN
INTRAMUSCULAR | Status: DC | PRN
  Administered 2021-02-28: 2 mg via INTRAVENOUS

## 2021-02-28 MED ORDER — ONDANSETRON HCL 4 MG/2ML IJ SOLN
INTRAMUSCULAR | Status: DC | PRN
  Administered 2021-02-28: 4 mg via INTRAVENOUS

## 2021-02-28 MED ORDER — DEXAMETHASONE SODIUM PHOSPHATE 10 MG/ML IJ SOLN
INTRAMUSCULAR | Status: DC | PRN
Start: 2021-02-28 — End: 2021-02-28
  Administered 2021-02-28: 10 mg via INTRAVENOUS

## 2021-02-28 MED ORDER — CEFAZOLIN SODIUM-DEXTROSE 2-4 GM/100ML-% IV SOLN
2.0000 g | INTRAVENOUS | Status: AC
  Administered 2021-02-28: 2 g via INTRAVENOUS

## 2021-02-28 MED ORDER — LIDOCAINE 2% (20 MG/ML) 5 ML SYRINGE
INTRAMUSCULAR | Status: AC
  Filled 2021-02-28: qty 5

## 2021-02-28 MED ORDER — FENTANYL CITRATE (PF) 100 MCG/2ML IJ SOLN: 25.0000 ug | INTRAMUSCULAR | Status: DC | PRN

## 2021-02-28 MED ORDER — MIDAZOLAM HCL 2 MG/2ML IJ SOLN
INTRAMUSCULAR | Status: AC
  Filled 2021-02-28: qty 2

## 2021-02-28 MED ORDER — HEPARIN SOD (PORK) LOCK FLUSH 100 UNIT/ML IV SOLN
INTRAVENOUS | Status: AC
  Filled 2021-02-28: qty 5

## 2021-02-28 MED ORDER — OXYCODONE HCL 5 MG PO TABS
ORAL_TABLET | ORAL | Status: AC
  Filled 2021-02-28: qty 1

## 2021-02-28 MED ORDER — BUPIVACAINE HCL (PF) 0.5 % IJ SOLN
INTRAMUSCULAR | Status: AC
  Filled 2021-02-28: qty 30

## 2021-02-28 MED ORDER — ACETAMINOPHEN 500 MG PO TABS: 1000.0000 mg | ORAL_TABLET | ORAL | Status: DC

## 2021-02-28 MED ORDER — OXYCODONE HCL 5 MG PO TABS
5.0000 mg | ORAL_TABLET | Freq: Once | ORAL | Status: AC | PRN
  Administered 2021-02-28: 5 mg via ORAL

## 2021-02-28 MED ORDER — FENTANYL CITRATE (PF) 100 MCG/2ML IJ SOLN
INTRAMUSCULAR | Status: AC
  Filled 2021-02-28: qty 2

## 2021-02-28 MED ORDER — ACETAMINOPHEN 500 MG PO TABS
ORAL_TABLET | ORAL | Status: AC
  Filled 2021-02-28: qty 2

## 2021-02-28 MED ORDER — PROPOFOL 10 MG/ML IV BOLUS
INTRAVENOUS | Status: AC
  Filled 2021-02-28: qty 20

## 2021-02-28 MED ORDER — DEXAMETHASONE SODIUM PHOSPHATE 10 MG/ML IJ SOLN
INTRAMUSCULAR | Status: AC
  Filled 2021-02-28: qty 1

## 2021-02-28 MED ORDER — OXYCODONE HCL 5 MG/5ML PO SOLN: 5.0000 mg | Freq: Once | ORAL | Status: AC | PRN

## 2021-02-28 MED ORDER — PROPOFOL 10 MG/ML IV BOLUS
INTRAVENOUS | Status: DC | PRN
  Administered 2021-02-28: 200 mg via INTRAVENOUS

## 2021-02-28 MED ORDER — ONDANSETRON HCL 4 MG/2ML IJ SOLN: 4.0000 mg | Freq: Once | INTRAMUSCULAR | Status: DC | PRN

## 2021-02-28 MED ORDER — BUPIVACAINE HCL 0.5 % IJ SOLN
INTRAMUSCULAR | Status: DC | PRN
  Administered 2021-02-28: 8 mL

## 2021-02-28 MED ORDER — TRAMADOL HCL 50 MG PO TABS: 50.0000 mg | ORAL_TABLET | Freq: Four times a day (QID) | ORAL | 0 refills | Status: DC | PRN

## 2021-02-28 MED ORDER — ONDANSETRON HCL 4 MG/2ML IJ SOLN
INTRAMUSCULAR | Status: AC
  Filled 2021-02-28: qty 2

## 2021-02-28 MED ORDER — BUPIVACAINE-EPINEPHRINE 0.5% -1:200000 IJ SOLN
INTRAMUSCULAR | Status: AC
  Filled 2021-02-28: qty 1

## 2021-02-28 MED ORDER — LIDOCAINE 2% (20 MG/ML) 5 ML SYRINGE
INTRAMUSCULAR | Status: DC | PRN
  Administered 2021-02-28: 60 mg via INTRAVENOUS

## 2021-02-28 MED FILL — Dexamethasone Sodium Phosphate Inj 100 MG/10ML: INTRAMUSCULAR | Qty: 1 | Status: AC

## 2021-02-28 SURGICAL SUPPLY — 42 items
ADH SKN CLS APL DERMABOND .7 (GAUZE/BANDAGES/DRESSINGS) ×1
APL PRP STRL LF DISP 70% ISPRP (MISCELLANEOUS) ×1
BAG DECANTER FOR FLEXI CONT (MISCELLANEOUS) ×2 IMPLANT
BLADE SURG 15 STRL LF DISP TIS (BLADE) ×1 IMPLANT
BLADE SURG 15 STRL SS (BLADE) ×2
CANISTER SUCT 1200ML W/VALVE (MISCELLANEOUS) IMPLANT
CHLORAPREP W/TINT 26 (MISCELLANEOUS) ×2 IMPLANT
COVER BACK TABLE 60X90IN (DRAPES) ×2 IMPLANT
COVER MAYO STAND STRL (DRAPES) ×2 IMPLANT
DECANTER SPIKE VIAL GLASS SM (MISCELLANEOUS) IMPLANT
DERMABOND ADVANCED (GAUZE/BANDAGES/DRESSINGS) ×1
DERMABOND ADVANCED .7 DNX12 (GAUZE/BANDAGES/DRESSINGS) ×1 IMPLANT
DRAPE C-ARM 42X120 X-RAY (DRAPES) ×2 IMPLANT
DRAPE LAPAROSCOPIC ABDOMINAL (DRAPES) ×2 IMPLANT
DRAPE UTILITY XL STRL (DRAPES) ×2 IMPLANT
DRSG TEGADERM 4X4.75 (GAUZE/BANDAGES/DRESSINGS) ×4 IMPLANT
ELECT REM PT RETURN 9FT ADLT (ELECTROSURGICAL) ×2
ELECTRODE REM PT RTRN 9FT ADLT (ELECTROSURGICAL) ×1 IMPLANT
GAUZE SPONGE 4X4 12PLY STRL LF (GAUZE/BANDAGES/DRESSINGS) ×2 IMPLANT
GLOVE SURG SIGNA 7.5 PF LTX (GLOVE) ×2 IMPLANT
GOWN STRL REUS W/ TWL LRG LVL3 (GOWN DISPOSABLE) ×1 IMPLANT
GOWN STRL REUS W/ TWL XL LVL3 (GOWN DISPOSABLE) ×1 IMPLANT
GOWN STRL REUS W/TWL LRG LVL3 (GOWN DISPOSABLE) ×2
GOWN STRL REUS W/TWL XL LVL3 (GOWN DISPOSABLE) ×2
IV KIT MINILOC 20X1 SAFETY (NEEDLE) IMPLANT
KIT PORT POWER 8FR ISP CVUE (Port) ×2 IMPLANT
NEEDLE HYPO 25X1 1.5 SAFETY (NEEDLE) ×2 IMPLANT
PACK BASIN DAY SURGERY FS (CUSTOM PROCEDURE TRAY) ×2 IMPLANT
PENCIL SMOKE EVACUATOR (MISCELLANEOUS) ×2 IMPLANT
SLEEVE SCD COMPRESS KNEE MED (STOCKING) ×2 IMPLANT
SUT MNCRL AB 4-0 PS2 18 (SUTURE) ×2 IMPLANT
SUT PROLENE 2 0 SH DA (SUTURE) ×2 IMPLANT
SUT SILK 2 0 TIES 17X18 (SUTURE)
SUT SILK 2-0 18XBRD TIE BLK (SUTURE) IMPLANT
SUT VIC AB 3-0 SH 27 (SUTURE) ×2
SUT VIC AB 3-0 SH 27X BRD (SUTURE) ×1 IMPLANT
SYR 20ML LL LF (SYRINGE) ×4 IMPLANT
SYR 5ML LL (SYRINGE) ×2 IMPLANT
SYR CONTROL 10ML LL (SYRINGE) ×2 IMPLANT
TOWEL OR 17X26 10 PK STRL BLUE (TOWEL DISPOSABLE) ×2 IMPLANT
TUBE CONNECTING 12X1/4 (SUCTIONS) ×2 IMPLANT
YANKAUER SUCT BULB TIP NO VENT (SUCTIONS) IMPLANT

## 2021-02-28 NOTE — Op Note (Signed)
INSERTION PORT-A-CATH  Procedure Note  Catherine Ellison 02/28/2021   Pre-op Diagnosis: LEFT BREAST CANCER     Post-op Diagnosis: same  Procedure(s): INSERTION PORT-A-CATH RIGHT SUBCLAVIAN VEIN (8 FR)  Surgeon(s): Coralie Keens, MD  Anesthesia: General  Staff:  Circulator: [place] Bing, RN; Consuello Masse, RN Relief Circulator: Scearce, [place] E, RN Scrub Person: Jolene Schimke, RN  Estimated Blood Loss: Minimal               Indications: This is a 45 year old female who will be starting neoadjuvant chemotherapy for a left breast cancer.  She presents for Port-A-Cath insertion  Procedure: The patient is brought to the operating room identifies correct patient.  She is placed upon the operating table general anesthesia was induced.  Her right chest and neck were prepped and draped in usual sterile fashion.  The patient was placed into the Trendelenburg position.  I anesthetized the skin and clavicle with Marcaine on the right side.  I then used the introducer needle to easily cannulate the right subclavian vein.  A wire was then passed through the needle into the central venous system under direct fluoroscopy.  The needle was then removed.  I anesthetized skin further and made an incision incorporating the wire site with a scalpel.  I then used the cautery to create a pocket for the port.  An 8 French [place] port was brought to the field.  The port fit easily into the pocket.  The port and the catheter were flushed.  I next placed the venous dilator introducer sheath over the wire and into the central venous system easily.  The wire and dilator removed.  I attached the catheter to the port and cut an appropriate length.  I then placed the port into the pocket and fed the catheter down the sheath.  The sheath was then peeled away.  I accessed the port and good flush and return were demonstrated.  Fluoroscopy confirmed placement of the superior vena cava.  I then sutured  the port to the chest wall 2 separate Prolene sutures.  I then closed the subcutaneous tissue with interrupted 3-0 Vicryl sutures and closed skin with a running 4-0 Monocryl.  I then accessed the port through the skin and again good flush and return were demonstrated.  I instilled the catheter with concentrated heparin solution.  Dermabond and a dressing were then applied.  The port was left accessed.  She tolerated the procedure well.  All the counts were correct at the end of the procedure.  She was then extubated in the operating room and taken in a stable condition to the recovery room.          Coralie Keens   Date: 02/28/2021  Time: 9:48 AM

## 2021-02-28 NOTE — Anesthesia Procedure Notes (Signed)
Procedure Name: LMA Insertion Date/Time: 02/28/2021 9:13 AM Performed by: Bonney Aid, CRNA Pre-anesthesia Checklist: Patient identified, Emergency Drugs available, Suction available and Patient being monitored Patient Re-evaluated:Patient Re-evaluated prior to induction Oxygen Delivery Method: Circle System Utilized Preoxygenation: Pre-oxygenation with 100% oxygen Induction Type: IV induction Ventilation: Mask ventilation without difficulty LMA: LMA inserted LMA Size: 4.0 Number of attempts: 1 Placement Confirmation: positive ETCO2 Tube secured with: Tape Dental Injury: Teeth and Oropharynx as per pre-operative assessment

## 2021-02-28 NOTE — Anesthesia Preprocedure Evaluation (Addendum)
Anesthesia Evaluation  Patient identified by MRN, date of birth, ID band Patient awake    Reviewed: Allergy & Precautions, NPO status , Patient's Chart, lab work & pertinent test results  History of Anesthesia Complications (+) history of anesthetic complications  Airway Mallampati: II  TM Distance: >3 FB Neck ROM: Full   Comment: Pierced tongue Dental no notable dental hx. (+) Dental Advisory Given   Pulmonary shortness of breath and with exertion,    Pulmonary exam normal breath sounds clear to auscultation       Cardiovascular negative cardio ROS Normal cardiovascular exam Rhythm:Regular Rate:Normal     Neuro/Psych PSYCHIATRIC DISORDERS Adjustment disordernegative neurological ROS     GI/Hepatic Neg liver ROS, GERD  Medicated,  Endo/Other  Left Breast Ca  Renal/GU negative Renal ROS  negative genitourinary   Musculoskeletal negative musculoskeletal ROS (+)   Abdominal (+) - obese,   Peds  Hematology  (+) anemia ,   Anesthesia Other Findings   Reproductive/Obstetrics                            Anesthesia Physical Anesthesia Plan  ASA: 2  Anesthesia Plan: General   Post-op Pain Management:    Induction: Intravenous  PONV Risk Score and Plan: 4 or greater and Treatment may vary due to age or medical condition, Midazolam, Ondansetron and Dexamethasone  Airway Management Planned: LMA  Additional Equipment:   Intra-op Plan:   Post-operative Plan:   Informed Consent: I have reviewed the patients History and Physical, chart, labs and discussed the procedure including the risks, benefits and alternatives for the proposed anesthesia with the patient or authorized representative who has indicated his/her understanding and acceptance.     Dental advisory given  Plan Discussed with: CRNA and Anesthesiologist  Anesthesia Plan Comments:         Anesthesia Quick  Evaluation

## 2021-02-28 NOTE — Telephone Encounter (Signed)
Received a message from patient stating her prescriptions Dr. Jana Hakim sent were to the wrong pharmacy.  Patient gave [facility] [address] in [place] as her pharmacy.    Left a message on her voicemail that I contacted the pharmacy and they are faxing them to the correct pharmacy ([place]).

## 2021-02-28 NOTE — Anesthesia Postprocedure Evaluation (Signed)
Anesthesia Post Note  Patient: Catherine Ellison  Procedure(s) Performed: INSERTION PORT-A-CATH (Right: Chest)     Patient location during evaluation: PACU Anesthesia Type: General Level of consciousness: awake and alert and oriented Pain management: pain level controlled Vital Signs Assessment: post-procedure vital signs reviewed and stable Respiratory status: spontaneous breathing, nonlabored ventilation and respiratory function stable Cardiovascular status: blood pressure returned to baseline and stable Postop Assessment: no apparent nausea or vomiting Anesthetic complications: no   No notable events documented.  Last Vitals:  Vitals:   02/28/21 1001 02/28/21 1015  BP: (!) 143/91 (!) 156/95  Pulse: 65 (!) 58  Resp: 15 15  Temp:    SpO2: 100% 100%    Last Pain:  Vitals:   02/28/21 0958  TempSrc:   PainSc: 0-No pain                 Jazz Rogala A.

## 2021-02-28 NOTE — Interval H&P Note (Signed)
History and Physical Interval Note: no change in H and P  02/28/2021 8:48 AM  Catherine Ellison  has presented today for surgery, with the diagnosis of LEFT BREAST CANCER.  The various methods of treatment have been discussed with the patient and family. After consideration of risks, benefits and other options for treatment, the patient has consented to  Procedure(s): INSERTION PORT-A-CATH (N/A) as a surgical intervention.  The patient's history has been reviewed, patient examined, no change in status, stable for surgery.  I have reviewed the patient's chart and labs.  Questions were answered to the patient's satisfaction.     Coralie Keens

## 2021-02-28 NOTE — Transfer of Care (Signed)
Immediate Anesthesia Transfer of Care Note  Patient: Catherine Ellison  Procedure(s) Performed: INSERTION PORT-A-CATH (Right: Chest)  Patient Location: PACU  Anesthesia Type:General  Level of Consciousness: awake, alert , oriented and patient cooperative  Airway & Oxygen Therapy: Patient Spontanous Breathing  Post-op Assessment: Report given to RN and Post -op Vital signs reviewed and stable  Post vital signs: Reviewed and stable  Last Vitals:  Vitals Value Taken Time  BP 143/91 02/28/21 1001  Temp 36.4 C 02/28/21 0958  Pulse 64 02/28/21 1004  Resp 15 02/28/21 1004  SpO2 100 % 02/28/21 1004  Vitals shown include unvalidated device data.  Last Pain:  Vitals:   02/28/21 0958  TempSrc:   PainSc: 0-No pain      Patients Stated Pain Goal: 4 (94/80/16 5537)  Complications: No notable events documented.

## 2021-02-28 NOTE — Discharge Instructions (Addendum)
You may start showering after chemotherapy tomorrow  Tylenol and an ice pack also for pain  No vigorous activity for 1 week  Call our office for any problems at [facility] Instructions  Activity: Get plenty of rest for the remainder of the day. A responsible individual must stay with you for 24 hours following the procedure.  For the next 24 hours, DO NOT: -Drive a car -Paediatric nurse -Drink alcoholic beverages -Take any medication unless instructed by your physician -Make any legal decisions or sign important papers.  Meals: Start with liquid foods such as gelatin or soup. Progress to regular foods as tolerated. Avoid greasy, spicy, heavy foods. If nausea and/or vomiting occur, drink only clear liquids until the nausea and/or vomiting subsides. Call your physician if vomiting continues.  Special Instructions/Symptoms: Your throat may feel dry or sore from the anesthesia or the breathing tube placed in your throat during surgery. If this causes discomfort, gargle with warm salt water. The discomfort should disappear within 24 hours.

## 2021-03-01 ENCOUNTER — Telehealth: Payer: Self-pay | Admitting: Genetic Counselor

## 2021-03-01 ENCOUNTER — Other Ambulatory Visit: Payer: Self-pay | Admitting: *Deleted

## 2021-03-01 ENCOUNTER — Inpatient Hospital Stay (HOSPITAL_BASED_OUTPATIENT_CLINIC_OR_DEPARTMENT_OTHER): Payer: BC Managed Care – PPO | Admitting: Adult Health

## 2021-03-01 ENCOUNTER — Inpatient Hospital Stay: Payer: BC Managed Care – PPO

## 2021-03-01 ENCOUNTER — Encounter: Payer: Self-pay | Admitting: *Deleted

## 2021-03-01 ENCOUNTER — Encounter: Payer: Self-pay | Admitting: Adult Health

## 2021-03-01 VITALS — BP 152/78 | HR 72 | Temp 98.2°F | Resp 18

## 2021-03-01 VITALS — BP 195/89 | HR 79 | Temp 97.5°F | Resp 18 | Ht 71.0 in | Wt 195.3 lb

## 2021-03-01 DIAGNOSIS — Z170 Estrogen receptor positive status [ER+]: Secondary | ICD-10-CM | POA: Diagnosis not present

## 2021-03-01 DIAGNOSIS — C50412 Malignant neoplasm of upper-outer quadrant of left female breast: Secondary | ICD-10-CM

## 2021-03-01 DIAGNOSIS — C773 Secondary and unspecified malignant neoplasm of axilla and upper limb lymph nodes: Secondary | ICD-10-CM | POA: Diagnosis not present

## 2021-03-01 DIAGNOSIS — Z5111 Encounter for antineoplastic chemotherapy: Secondary | ICD-10-CM | POA: Diagnosis present

## 2021-03-01 DIAGNOSIS — Z95828 Presence of other vascular implants and grafts: Secondary | ICD-10-CM

## 2021-03-01 LAB — CBC WITH DIFFERENTIAL (CANCER CENTER ONLY)
Abs Immature Granulocytes: 0.06 10*3/uL (ref 0.00–0.07)
Basophils Absolute: 0 10*3/uL (ref 0.0–0.1)
Basophils Relative: 0 %
Eosinophils Absolute: 0 10*3/uL (ref 0.0–0.5)
Eosinophils Relative: 0 %
HCT: 27.6 % — ABNORMAL LOW (ref 36.0–46.0)
Hemoglobin: 8.4 g/dL — ABNORMAL LOW (ref 12.0–15.0)
Immature Granulocytes: 0 %
Lymphocytes Relative: 18 %
Lymphs Abs: 2.6 10*3/uL (ref 0.7–4.0)
MCH: 21.5 pg — ABNORMAL LOW (ref 26.0–34.0)
MCHC: 30.4 g/dL (ref 30.0–36.0)
MCV: 70.8 fL — ABNORMAL LOW (ref 80.0–100.0)
Monocytes Absolute: 0.8 10*3/uL (ref 0.1–1.0)
Monocytes Relative: 6 %
Neutro Abs: 10.9 10*3/uL — ABNORMAL HIGH (ref 1.7–7.7)
Neutrophils Relative %: 76 %
Platelet Count: 397 10*3/uL (ref 150–400)
RBC: 3.9 MIL/uL (ref 3.87–5.11)
RDW: 18.6 % — ABNORMAL HIGH (ref 11.5–15.5)
WBC Count: 14.3 10*3/uL — ABNORMAL HIGH (ref 4.0–10.5)
nRBC: 0 % (ref 0.0–0.2)

## 2021-03-01 LAB — CMP (CANCER CENTER ONLY)
ALT: 15 U/L (ref 0–44)
AST: 27 U/L (ref 15–41)
Albumin: 3.6 g/dL (ref 3.5–5.0)
Alkaline Phosphatase: 78 U/L (ref 38–126)
Anion gap: 8 (ref 5–15)
BUN: 10 mg/dL (ref 6–20)
CO2: 23 mmol/L (ref 22–32)
Calcium: 8.8 mg/dL — ABNORMAL LOW (ref 8.9–10.3)
Chloride: 103 mmol/L (ref 98–111)
Creatinine: 0.97 mg/dL (ref 0.44–1.00)
GFR, Estimated: >60 mL/min (ref >60)
Glucose, Bld: 110 mg/dL — ABNORMAL HIGH (ref 70–99)
Potassium: 3.9 mmol/L (ref 3.5–5.1)
Sodium: 134 mmol/L — ABNORMAL LOW (ref 135–145)
Total Bilirubin: 0.3 mg/dL (ref 0.3–1.2)
Total Protein: 7.4 g/dL (ref 6.5–8.1)

## 2021-03-01 MED ORDER — ONDANSETRON HCL 8 MG PO TABS: 8.0000 mg | ORAL_TABLET | Freq: Two times a day (BID) | ORAL | 1 refills | Status: DC | PRN

## 2021-03-01 MED ORDER — DEXAMETHASONE SODIUM PHOSPHATE 100 MG/10ML IJ SOLN
10.0000 mg | Freq: Once | INTRAMUSCULAR | Status: AC
  Administered 2021-03-01: 10 mg via INTRAVENOUS
  Filled 2021-03-01: qty 10

## 2021-03-01 MED ORDER — PROCHLORPERAZINE MALEATE 10 MG PO TABS: 10.0000 mg | ORAL_TABLET | Freq: Four times a day (QID) | ORAL | 1 refills | Status: DC | PRN

## 2021-03-01 MED ORDER — FAMOTIDINE 20 MG IN NS 100 ML IVPB
20.0000 mg | Freq: Once | INTRAVENOUS | Status: AC
  Administered 2021-03-01: 20 mg via INTRAVENOUS
  Filled 2021-03-01: qty 100

## 2021-03-01 MED ORDER — GABAPENTIN 300 MG PO CAPS
300.0000 mg | ORAL_CAPSULE | Freq: Three times a day (TID) | ORAL | 1 refills | Status: DC
Start: 2021-03-01 — End: 2021-06-27

## 2021-03-01 MED ORDER — OXYCODONE HCL 5 MG PO TABS: 5.0000 mg | ORAL_TABLET | Freq: Once | ORAL | Status: DC

## 2021-03-01 MED ORDER — ACETAMINOPHEN 325 MG PO TABS: 325.0000 mg | ORAL_TABLET | Freq: Once | ORAL | Status: DC

## 2021-03-01 MED ORDER — SODIUM CHLORIDE 0.9 % IV SOLN: Freq: Once | INTRAVENOUS | Status: AC

## 2021-03-01 MED ORDER — OXYCODONE HCL 5 MG PO TABS
ORAL_TABLET | ORAL | Status: AC
  Filled 2021-03-01: qty 1

## 2021-03-01 MED ORDER — LIDOCAINE-PRILOCAINE 2.5-2.5 % EX CREA: TOPICAL_CREAM | CUTANEOUS | 3 refills | Status: DC

## 2021-03-01 MED ORDER — SODIUM CHLORIDE 0.9 % IV SOLN
80.0000 mg/m2 | Freq: Once | INTRAVENOUS | Status: AC
  Administered 2021-03-01: 168 mg via INTRAVENOUS
  Filled 2021-03-01: qty 28

## 2021-03-01 MED ORDER — DIPHENHYDRAMINE HCL 50 MG/ML IJ SOLN
25.0000 mg | Freq: Once | INTRAMUSCULAR | Status: AC
  Administered 2021-03-01: 25 mg via INTRAVENOUS
  Filled 2021-03-01: qty 1

## 2021-03-01 MED ORDER — ACETAMINOPHEN 325 MG PO TABS
ORAL_TABLET | ORAL | Status: AC
  Filled 2021-03-01: qty 1

## 2021-03-01 MED ORDER — OXYCODONE HCL 5 MG PO TABS
5.0000 mg | ORAL_TABLET | Freq: Once | ORAL | Status: AC
  Administered 2021-03-01: 5 mg via ORAL

## 2021-03-01 MED ORDER — ACETAMINOPHEN 325 MG PO TABS
325.0000 mg | ORAL_TABLET | Freq: Once | ORAL | Status: AC
  Administered 2021-03-01: 325 mg via ORAL

## 2021-03-01 NOTE — Progress Notes (Signed)
Spoke with RN  Corene Cornea, seems like to roxicodone isn't crossing over to pyxis despite the order being verified, dc'd and re-entered again. RN will over ride pyxis in order to get medication

## 2021-03-01 NOTE — Progress Notes (Signed)
[facility]  [phone] [phone]     ID: Catherine Ellison DOB: 1975/10/18  MR#: [id number]  CSN#:[id number]  Patient Care Team: [name], NP as PCP - General (Adult Health Nurse Practitioner) Coralie Keens, MD as Consulting Physician (General Surgery) Magrinat, Virgie Dad, MD as Consulting Physician (Oncology) Eppie Gibson, MD as Attending Physician (Radiation Oncology) Rockwell Germany, RN as Oncology Nurse Navigator Mauro Kaufmann, RN as Oncology Nurse Navigator Demichael Traum, Charlestine Massed, NP as Nurse Practitioner (Hematology and Oncology) Scot Dock, NP OTHER MD:  CHIEF COMPLAINT: Estrogen receptor positive breast cancer  CURRENT TREATMENT: Neoadjuvant chemotherapy   HISTORY OF CURRENT ILLNESS: Catherine Lore "Catherine Ellison" presented with pain, erythema, warmth, and skin thickening to the left breast. She was treated with 10 days of Augmentin, which overall improved her symptoms. However, she had a persistent  lump in the upper-outer left breast. She underwent bilateral diagnostic mammography with tomography and bilateral breast ultrasonography at [facility] on 01/30/2021 showing: breast density category C; bilateral fibrocystic changes; 1.4 cm irregular mass in right breast at 3 o'clock; palpable, indeterminate 4.1 cm left breast mass at 2 o'clock; three abnormal nodes in left axilla; right axilla not imaged.  Accordingly on 01/30/2021 she proceeded to biopsy of the right breast area in question. The pathology from this procedure ([id number]) showed: predominantly fatty tissue with focal benign breast tissue.  She also underwent biopsy of the left breast area in question on 02/07/2021. The pathology from this procedure ([id number]) showed: invasive ductal carcinoma, grade 3. Prognostic indicators significant for: estrogen receptor, 70% positive with weak staining intensity and progesterone receptor, 0% negative. Proliferation  marker Ki67 at 40%. HER2 equivocal by immunohistochemistry (2+), but negative by fluorescent in situ hybridization with a signals ratio 1.49 and number per cell 3.50.  She also underwent biopsy of a left axillary node yesterday, 02/12/2021. The pathology ([id number]) confirmed metastatic carcinoma involving nodal tissue.  Cancer Staging Malignant neoplasm of upper-outer quadrant of left breast in female, estrogen receptor positive ([place]) Staging form: Breast, AJCC 8th Edition - Clinical stage from 02/13/2021: Stage IIIA (cT2, cN1, cM0, G3, ER+, PR-, HER2-) - Signed by [name], MD on 02/13/2021 Stage prefix: Initial diagnosis Histologic grading system: 3 grade system Laterality: Left Staged by: Pathologist and managing physician Stage used in treatment planning: Yes National guidelines used in treatment planning: Yes Type of national guideline used in treatment planning: NCCN  The patient's subsequent history is as detailed below.   INTERVAL HISTORY: Catherine "Catherine Ellison" is here today for f/u of her breast cancer to begin neoadjuvant chemotherapy with weekly Paclitaxel.  She notes that she is nervous about the treatment.  She is working and in school, and is unsure what to expect.    She underwent port placement yesterday and notes increased soreness at her prot site from the placement.  She is taking tramadol as prescribed by the surgeon which is helping her pain at home.  She is overall feeling well and is without any new concerns today.     REVIEW OF SYSTEMS: Review of Systems  Constitutional:  Negative for appetite change, chills, fatigue, fever and unexpected weight change.  HENT:   Negative for hearing loss, lump/mass and trouble swallowing.   Eyes:  Negative for eye problems and icterus.  Respiratory:  Negative for chest tightness, cough and shortness of breath.   Cardiovascular:  Negative for chest pain, leg swelling and palpitations.  Gastrointestinal:  Negative for abdominal  distention,  abdominal pain, constipation, diarrhea, nausea and vomiting.  Endocrine: Negative for hot flashes.  Genitourinary:  Negative for difficulty urinating.   Musculoskeletal:  Negative for arthralgias.  Skin:  Negative for itching and rash.  Neurological:  Negative for dizziness, extremity weakness, headaches and numbness.  Hematological:  Negative for adenopathy. Does not bruise/bleed easily.  Psychiatric/Behavioral:  Negative for depression. The patient is nervous/anxious.      COVID 19 VACCINATION STATUS:    PAST MEDICAL HISTORY: Past Medical History:  Diagnosis Date   Allergy    seasonal    Anemia    Complication of anesthesia    bp dropped with 1st ovary surgery, no problems since with surgery   Dyspnea    with heavy exertion   GERD (gastroesophageal reflux disease)    History of gestational diabetes 2005   Hyperemesis gravidarum    with all pregnancies   IBS (irritable bowel syndrome)    left breast cancer     PAST SURGICAL HISTORY: Past Surgical History:  Procedure Laterality Date   BILATERAL SALPINGECTOMY Bilateral 2014   emergency   LEFT OOPHORECTOMY Left 2006    FAMILY HISTORY: Family History  Problem Relation Age of Onset   Diabetes Mother    Hypertension Mother    Diabetes Father    Heart disease Father        4V CABG 03/2016, age 96   Hyperlipidemia Father    Hypertension Father    Diabetes Sister    Hypertension Sister    Breast cancer Maternal Aunt        dx. 88s   Hypertension Maternal Grandmother    Colon cancer Maternal Grandmother        dx. >50   Hypertension Maternal Grandfather    Hypertension Paternal Grandmother    Hypertension Paternal Grandfather    Lung cancer Paternal Grandfather    Her parents are both living-- her mother at age 88 and her father at age 69 as of 01/2021. Catherine Ellison has one brother and two sisters. She reports breast cancer in a maternal aunt in her 40's, lung cancer in her paternal grandfather (tobacco user), and  colon cancer in her maternal grandmother and a maternal great-aunt (mother's aunt). Of note, one of her sisters underwent genetic testing for keloids, and results were negative.   GYNECOLOGIC HISTORY:  Patient's last menstrual period was 02/27/2021 (exact date). Menarche: 45 years old Age at first live birth: 45 years old [place] P 3 LMP 01/30/2021, irregular and heavy Contraceptive: never used (not currently active) HRT n/a  Hysterectomy? no BSO? Yes, with benign pathology   SOCIAL HISTORY: (updated 01/2021)  Catherine "Catherine Ellison" is currently working as a Marine scientist. She is currently attending classes on Mon, Wed, and Thurs. She is single. She lives at home with son Catherine Ellison, age 79. Son Catherine Ellison, age 21, is a Training and development officer in [place], [place]. Son Catherine Ree Edman., age 57, is a Herbalist in [place], [place]. Catherine Ellison has two grandchildren, a girl age 88 and a boy age 61, living in [place], [place]: not in place; will likely name her mother, Catherine Ellison, as her HCPOA. She can be reached at [facility]: Social History   Tobacco Use   Smoking status: Never   Smokeless tobacco: Never  Vaping Use   Vaping Use: Never used  Substance Use Topics   Alcohol use: Yes    Alcohol/week: 5.0 standard drinks    Types: 1 Glasses of  wine, 1 Cans of beer, 1 Shots of liquor, 2 Standard drinks or equivalent per week    Comment: 3-4 drinks per day   Drug use: No     Colonoscopy: yes, performed with [place], [place] [facility]  PAP: 06/2020  Bone density: never done (age)   Allergies  Allergen Reactions   Latex Rash    Current Outpatient Medications  Medication Sig Dispense Refill   acetaminophen (TYLENOL) 325 MG tablet Take 650 mg by mouth every 6 (six) hours as needed.     acetaminophen (TYLENOL) 650 MG CR tablet Take 650 mg by mouth every 8 (eight) hours as needed for pain.     dicyclomine (BENTYL) 10 MG capsule Take 10 mg by mouth as needed.     gabapentin (NEURONTIN) 300  MG capsule Take 300 mg by mouth 3 (three) times daily.     ibuprofen (ADVIL) 800 MG tablet Take 800 mg by mouth.     LINACLOTIDE PO Take 72 mcg by mouth daily. linzess     Melatonin 3 MG TABS Take 1 tablet (3 mg total) by mouth at bedtime. (Patient taking differently: Take 3 mg by mouth at bedtime as needed.) 60 tablet 0   traMADol (ULTRAM) 50 MG tablet Take 1 tablet (50 mg total) by mouth every 6 (six) hours as needed for moderate pain or severe pain. 25 tablet 0   lidocaine-prilocaine (EMLA) cream Apply to affected area once (Patient not taking: Reported on 03/01/2021) 30 g 3   prochlorperazine (COMPAZINE) 10 MG tablet Take 1 tablet (10 mg total) by mouth every 6 (six) hours as needed (Nausea or vomiting). (Patient not taking: Reported on 03/01/2021) 30 tablet 1   No current facility-administered medications for this visit.    OBJECTIVE: African-American woman who appears younger than stated age  18:   03/01/21 0855  BP: (!) 195/89  Pulse: 79  Resp: 18  Temp: (!) 97.5 F (36.4 C)  SpO2: 100%     Body mass index is 27.24 kg/m.   Wt Readings from Last 3 Encounters:  03/01/21 195 lb 4.8 oz (88.6 kg)  02/28/21 192 lb (87.1 kg)  02/13/21 192 lb 3.2 oz (87.2 kg)      ECOG FS:1 - Symptomatic but completely ambulatory GENERAL: Patient is a well appearing female in no acute distress HEENT:  Sclerae anicteric.  Oropharynx clear and moist. No ulcerations or evidence of oropharyngeal candidiasis. Neck is supple.  NODES:  No cervical, supraclavicular, or axillary lymphadenopathy palpated.  BREAST EXAM:  left breast with large mass in upper breast LUNGS:  Clear to auscultation bilaterally.  No wheezes or rhonchi. HEART:  Regular rate and rhythm. No murmur appreciated. ABDOMEN:  Soft, nontender.  Positive, normoactive bowel sounds. No organomegaly palpated. MSK:  No focal spinal tenderness to palpation. Full range of motion bilaterally in the upper extremities. EXTREMITIES:  No  peripheral edema.   SKIN:  Clear with no obvious rashes or skin changes. No nail dyscrasia. NEURO:  Nonfocal. Well oriented.  Appropriate affect.    LAB RESULTS:  CMP     Component Value Date/Time   NA 138 02/13/2021 0830   NA 138 08/20/2016 1712   K 4.0 02/13/2021 0830   CL 106 02/13/2021 0830   CO2 23 02/13/2021 0830   GLUCOSE 96 02/13/2021 0830   BUN 14 02/13/2021 0830   BUN 11 08/20/2016 1712   CREATININE 0.88 02/13/2021 0830   CALCIUM 9.1 02/13/2021 0830   PROT 7.0 02/13/2021 0830   PROT  6.9 08/20/2016 1712   ALBUMIN 3.5 02/13/2021 0830   ALBUMIN 4.0 08/20/2016 1712   AST 19 02/13/2021 0830   ALT 12 02/13/2021 0830   ALKPHOS 93 02/13/2021 0830   BILITOT 0.3 02/13/2021 0830   GFRNONAA >60 02/13/2021 0830   GFRAA 88 08/20/2016 1712    No results found for: TOTALPROTELP, ALBUMINELP, A1GS, A2GS, BETS, BETA2SER, GAMS, MSPIKE, SPEI  Lab Results  Component Value Date   WBC 8.0 02/13/2021   NEUTROABS 5.0 02/13/2021   HGB 8.7 (L) 02/13/2021   HCT 29.1 (L) 02/13/2021   MCV 71.1 (L) 02/13/2021   PLT 401 (H) 02/13/2021     Lab Results  Component Value Date   IRON 16 (L) 06/30/2019   TIBC 385 06/30/2019   IRONPCTSAT 4 (LL) 06/30/2019   (Iron and TIBC)  Lab Results  Component Value Date   FERRITIN 18 06/30/2019    Urinalysis    Component Value Date/Time   LABSPEC >=1.030 04/22/2019 1307   PHURINE 5.5 04/22/2019 1307   GLUCOSEU NEGATIVE 04/22/2019 1307   HGBUR NEGATIVE 04/22/2019 1307   BILIRUBINUR negative 06/30/2019 1343   KETONESUR negative 06/30/2019 1343   KETONESUR NEGATIVE 04/22/2019 1307   PROTEINUR negative 06/30/2019 1343   PROTEINUR NEGATIVE 04/22/2019 1307   UROBILINOGEN 0.2 06/30/2019 1343   UROBILINOGEN 0.2 04/22/2019 1307   NITRITE Negative 06/30/2019 1343   NITRITE NEGATIVE 04/22/2019 1307   LEUKOCYTESUR Trace (A) 06/30/2019 1343   LEUKOCYTESUR NEGATIVE 04/22/2019 1307     STUDIES:    ELIGIBLE FOR AVAILABLE RESEARCH PROTOCOL:  no  ASSESSMENT: 45 y.o. [place], [place] woman status post left breast upper outer quadrant biopsy 02/07/2021 for a clinical T2 Ni, stage IIIA invasive ductal carcinoma, grade 3, estrogen receptor weakly positive, progesterone receptor and HER2 negative, with an MIB-1 of 40%.  (1) genetics testing  (2) neoadjuvant chemotherapy will consist of paclitaxel weekly x12 followed by dose dense doxorubicin and cyclophosphamide x4  (3) definitive surgery to follow  (4) adjuvant radiation as appropriate  (5) antiestrogens.  PLAN:  Aqua is doing well today.  I reviewed her CBC with her and we discussed her upcoming chemotherapy with weekly Paclitaxel.  I reviewed common adverse effects of the treatment in particular peripheral neuroapthy.  We discussed cryotherapy for peripheral neuropathy prevention.  Beverlyann understands the risks and benefits of her chemotherapy regimen and is willing to proceed.    We discussed her overall treatment plan, anti nausea medications, and how to take them.  I also sent in her medications to her local pharmacy.   Kaidynce will keep a journal of her chemotherapy side effects.  We will see her in one week to assess her tolerance of treatment.  She knows to call for any questions that may arise between now and her next appointment.  We are happy to see her sooner if needed.  Total encounter time 30 minutes.* in face-to-face visit time, chart review, lab review, order entry, and documentation of the encounter.  Wilber Bihari, NP 03/01/21 9:02 AM Medical Oncology and Hematology [facility] [place], [place] [address] Tel. [phone]    Fax. [phone]  *Total Encounter Time as defined by the Centers for Medicare and Medicaid Services includes, in addition to the face-to-face time of a patient visit (documented in the note above) non-face-to-face time: obtaining and reviewing outside history, ordering and reviewing medications, tests or  procedures, care coordination (communications with other health care professionals or caregivers) and documentation in the medical record.

## 2021-03-01 NOTE — Telephone Encounter (Signed)
I contacted Ms. Catherine Ellison to discuss her genetic testing results. 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   The test report has been scanned into EPIC and is located under the Molecular Pathology section of the Results Review tab.  A portion of the result report is included below for reference. Detailed clinic note to follow.  Lucille Passy, MS, [facility] Genetic Counselor [name] (P) [phone]

## 2021-03-01 NOTE — Patient Instructions (Signed)
[facility]  Discharge Instructions: Thank you for choosing [facility] to provide your oncology and hematology care.   If you have a lab appointment with the [facility], please go directly to the [facility] and check in at the registration area.   Wear comfortable clothing and clothing appropriate for easy access to any Portacath or PICC line.   We strive to give you quality time with your provider. You may need to reschedule your appointment if you arrive late (15 or more minutes).  Arriving late affects you and other patients whose appointments are after yours.  Also, if you miss three or more appointments without notifying the office, you may be dismissed from the clinic at the provider's discretion.      For prescription refill requests, have your pharmacy contact our office and allow 72 hours for refills to be completed.    Today you received the following chemotherapy and/or immunotherapy agents Taxol    To help prevent nausea and vomiting after your treatment, we encourage you to take your nausea medication as directed.  BELOW ARE SYMPTOMS THAT SHOULD BE REPORTED IMMEDIATELY: *FEVER GREATER THAN 100.4 F (38 C) OR HIGHER *CHILLS OR SWEATING *NAUSEA AND VOMITING THAT IS NOT CONTROLLED WITH YOUR NAUSEA MEDICATION *UNUSUAL SHORTNESS OF BREATH *UNUSUAL BRUISING OR BLEEDING *URINARY PROBLEMS (pain or burning when urinating, or frequent urination) *BOWEL PROBLEMS (unusual diarrhea, constipation, pain near the anus) TENDERNESS IN MOUTH AND THROAT WITH OR WITHOUT PRESENCE OF ULCERS (sore throat, sores in mouth, or a toothache) UNUSUAL RASH, SWELLING OR PAIN  UNUSUAL VAGINAL DISCHARGE OR ITCHING   Items with * indicate a potential emergency and should be followed up as soon as possible or go to the Emergency Department if any problems should occur.  Please show the CHEMOTHERAPY ALERT CARD or IMMUNOTHERAPY ALERT CARD at check-in to the  Emergency Department and triage nurse.  Should you have questions after your visit or need to cancel or reschedule your appointment, please contact [facility]  Dept: [phone]  and follow the prompts.  Office hours are 8:00 a.m. to 4:30 p.m. Monday - Friday. Please note that voicemails left after 4:00 p.m. may not be returned until the following business day.  We are closed weekends and major holidays. You have access to a nurse at all times for urgent questions. Please call the main number to the clinic Dept: [phone] and follow the prompts.   For any non-urgent questions, you may also contact your provider using MyChart. We now offer e-Visits for anyone 29 and older to request care online for non-urgent symptoms. For details visit [email or web address].   Also download the MyChart app! Go to the app store, search "MyChart", open the app, select [facility], and log in with your MyChart username and password.  Due to Covid, a mask is required upon entering the hospital/clinic. If you do not have a mask, one will be given to you upon arrival. For doctor visits, patients may have 1 support person aged 57 or older with them. For treatment visits, patients cannot have anyone with them due to current Covid guidelines and our immunocompromised population.

## 2021-03-04 ENCOUNTER — Encounter: Payer: Self-pay | Admitting: Genetic Counselor

## 2021-03-04 ENCOUNTER — Encounter: Payer: Self-pay | Admitting: Oncology

## 2021-03-04 ENCOUNTER — Ambulatory Visit: Payer: Self-pay | Admitting: Genetic Counselor

## 2021-03-04 ENCOUNTER — Other Ambulatory Visit: Payer: Self-pay | Admitting: Oncology

## 2021-03-04 DIAGNOSIS — Z1589 Genetic susceptibility to other disease: Secondary | ICD-10-CM | POA: Insufficient documentation

## 2021-03-04 DIAGNOSIS — Z1379 Encounter for other screening for genetic and chromosomal anomalies: Secondary | ICD-10-CM

## 2021-03-04 NOTE — Progress Notes (Signed)
HPI:   Catherine Ellison was previously seen in the [facility] due to a personal and family history of cancer and concerns regarding a hereditary predisposition to cancer. Please refer to our prior cancer genetics clinic note for more information regarding our discussion, assessment and recommendations, at the time. Catherine Ellison recent genetic test results were disclosed to her, as were recommendations warranted by these results. These results and recommendations are discussed in more detail below.  CANCER HISTORY:  Oncology History  Malignant neoplasm of upper-outer quadrant of left breast in female, estrogen receptor positive ([place])  02/11/2021 Initial Diagnosis   Malignant neoplasm of upper-outer quadrant of left breast in female, estrogen receptor positive ([place])   02/13/2021 Cancer Staging   Staging form: Breast, AJCC 8th Edition - Clinical stage from 02/13/2021: Stage IIIA (cT2, cN1, cM0, G3, ER+, PR-, HER2-) - Signed by [name], MD on 02/13/2021 Stage prefix: Initial diagnosis Histologic grading system: 3 grade system Laterality: Left Staged by: Pathologist and managing physician Stage used in treatment planning: Yes National guidelines used in treatment planning: Yes Type of national guideline used in treatment planning: NCCN   02/19/2021 Genetic Testing   [place] (65 genes) 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 The report date is 02/26/2021.   The Custom[facility]-Cancer+RNAinsight panel offered by [facility] includes sequencing and rearrangement analysis for the following 47 genes:  APC, ATM, AXIN2, BARD1, BMPR1A, BRCA1, BRCA2, BRIP1, CDH1, CDK4, CDKN2A, CHEK2, DICER1, EPCAM, GREM1, HOXB13, MEN1, MLH1, MSH2, MSH3, MSH6, MUTYH, NBN, NF1, NF2, NTHL1, PALB2, PMS2, POLD1, POLE, PTEN, RAD51C, RAD51D, RECQL,  RET, SDHA, SDHAF2, SDHB, SDHC, SDHD, SMAD4, SMARCA4, STK11, TP53, TSC1, TSC2, and VHL.  RNA data is routinely analyzed for use in variant interpretation for all genes.   03/01/2021 -  Chemotherapy   Patient is on Treatment Plan : BREAST PACLitaxel 7d / Adjuvant Dose Dense AC q14d       FAMILY HISTORY:  We obtained a detailed, 4-generation family history.  Significant diagnoses are listed below:        Family History  Problem Relation Age of Onset   Breast cancer Maternal Aunt          dx. 37s   Colon cancer Maternal Grandmother          dx. >50   Lung cancer Paternal Grandfather           Catherine Ellison's maternal aunt was diagnosed with breast cancer in her 48s. Her maternal grandmother was diagnosed with colon cancer (>39 years old), she died due to colon cancer metastasis. Her maternal grandmother's sister was diagnosed with colon cancer at an unknown age, she is deceased. Her paternal grandfather was diagnosed with lung cancer (>91 years old), he smoked and is deceased.    Catherine Ellison reports her sister had negative genetic testing for hereditary cancer risks. There no reported Ashkenazi Jewish ancestry.   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 Specifically, this variant is p.[id number]*.  The test report has been scanned into EPIC and is located under the Molecular Pathology section of the Results Review tab.  A portion of the result report is included below for reference. Genetic testing reported out on 02/26/2021.  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 At this time, it is unknown if this variant is associated with an increased risk for cancer or if it is benign, but most uncertain variants are reclassified to benign. It should not be used to make medical management  decisions. With time, we suspect the laboratory will determine the significance of this variant, if any. If the laboratory reclassifies this variant, we will contact Catherine Ellison to discuss it further. We do not recommend familial testing for the ATM variant of uncertain significance (VUS).  Clinical Information: Changes in the MUTYH gene are associated with MUTYH-associated Polyposis syndrome (MAP). MAP is a hereditary cancer condition in which patients have high risks for colorectal cancer due to a large number of adenomatous polyps in the gastrointestinal system. MAP is unique among the hereditary cancer syndromes in that it is a "recessive" condition. Most hereditary cancer conditions are "dominant", meaning the condition is present in patients with a mutation in one copy of the gene. Patients have MAP only if there are mutations in both of their copies of the MUTYH gene. 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 It is estimated that 1% to 2% of the population has a mutation in one copy of the MUTYH gene. These individuals do not have MAP.   There is conflicting information regarding colon cancer risks for MUTYH carriers. Some studies have reported a 2-3 fold increased risk-- more so in individuals with a family history of colon cancer. Other studies have found no substantial evidence supporting MUTYH carriers having increased risks of colon cancer. At this time, the [employer]  recommends screening for MUTYH carriers based on family history of colon cancer.  Research is continuing to help learn more about the cancers associated with MUTYH pathogenic variants and what the exact risks are to develop these cancers.   Management Recommendations for individuals with a single MUTYH pathogenic variant:  Colon Cancer Screening: If an individual has a first-degree relative with colorectal cancer,  screening should begin at age 40 or 83 years prior to the relative's age at diagnosis, whichever comes first and repeat colonoscopies every 5 years.   If an individual has no first-degree relatives with colorectal cancer, data is unclear if specialized screening is warranted. We advise these patients to follow their gastroenterologists recommendations.  This information is based on current understanding of the gene and may change in the future.   Implications for Family Members: Catherine Ellison first degree relatives are at a 50% risk for also having inherited the MUTYH mutation. Family members may consider genetic testing for this familial pathogenic variant. As there are generally no childhood cancer risks associated with pathogenic variants in the MUTYH gene, individuals in the family are not recommended to have testing until they reach at least 48 years of age. They may contact our office at [phone] for more information or to schedule an appointment.  Complimentary testing for the familial variant is available for 90 days from the report date.  Family members who live outside of the area are encouraged to find a genetic counselor in their area by visiting: [email or web address].   Additional Information: Even though a pathogenic variant was not identified that explains Catherine Ellison's personal history of breast cancer, possible explanations for her history of cancer may include: There may be no hereditary risk for cancer in the family. The  cancers in Catherine Ellison may be due to other genetic or environmental factors. There may be a gene mutation in one of these genes that current testing methods cannot detect, but that chance is small. There could be another gene that has not yet been discovered, or that we have not yet tested, that is responsible for the cancer diagnoses in the family.   Therefore, it is important to remain in touch with cancer genetics in the future so  that we can continue to offer Catherine Ellison the most up to date genetic testing.   Additional Genetic Testing:  We discussed with Catherine Ellison that her genetic testing was fairly extensive.  If there are genes identified to increase cancer risk that can be analyzed in the future, we would be happy to discuss and coordinate this testing at that time.    Follow-Up:  Cancer genetics is a rapidly advancing field and it is possible that new genetic tests will be appropriate for her and/or her family members in the future. We encouraged her to remain in contact with cancer genetics on an annual basis so we can update her personal and family histories and let her know of advances in cancer genetics that may benefit this family.   Our contact number was provided. Ms. Comes questions were answered to her satisfaction, and she knows she is welcome to call us at anytime with additional questions or concerns.   Catherine Passy, MS, [facility] Genetic Counselor [email or web address] (P) [phone]

## 2021-03-05 ENCOUNTER — Encounter: Payer: Self-pay | Admitting: *Deleted

## 2021-03-05 ENCOUNTER — Other Ambulatory Visit: Payer: Self-pay | Admitting: *Deleted

## 2021-03-05 ENCOUNTER — Telehealth: Payer: Self-pay | Admitting: *Deleted

## 2021-03-05 DIAGNOSIS — C50412 Malignant neoplasm of upper-outer quadrant of left female breast: Secondary | ICD-10-CM

## 2021-03-05 DIAGNOSIS — Z170 Estrogen receptor positive status [ER+]: Secondary | ICD-10-CM

## 2021-03-05 NOTE — Progress Notes (Signed)
[facility] CSW Progress Notes  Intern called and spoke with patient over the phone to follow up since [facility]. Patient reported pain since Saturday 10/15 including body aches, chest discomfort and trouble breathing. Intern called nursing team, and Mateo Flow followed up with patient by phone to address symptoms.  Intern has sent patient applications for [employer], and [place]. Patient will bring completed applications to appointment on 10/20, and CSW Edwyna Shell is scheduled to meet with her in infusion to complete the medical portion of apps.  Rosary Lively, BSW Intern Supervised by Gwinda Maine, LCSW

## 2021-03-05 NOTE — Telephone Encounter (Signed)
This RN spoke with pt per her VM ( left x 2 this am ) stating she is having severe pain.  She states she developed pain during treatment - " and they gave me a percocet " - pain subsided until Sunday pm-   She describes pain in her legs- arms and hips " my hips feel like they are going to crack open "  She states she had episode of chest discomfort with infusion with noted decrease in heart rate " to the 50s but then when I got home later it was up in the 120s"  She states she had mid chest pain on Saturday and Sunday with decreased symptoms presently.  She states she has used the gabapentin 300 mg tid, aleve, tylenol and tramadol " but nothing seems to alleviate it ".  She states pain has kept her awake- " and I don't know if I can come and do the treatment feeling this way on Thursday "  She states she is monitoring her temp with no fevers noted.  This RN discussed above- including chest discomfort may be related to the IV decadron- and suggested she take pepcid bid.  Her pain concerns will be given to MD for review for further recommendations.

## 2021-03-06 NOTE — Progress Notes (Signed)
[facility]  Telephone:(336) [phone] Fax:(336) [phone]     ID: [name] DOB: 06/05/1975  MR#: [id number]  CSN#:[id number]  Patient Care Team: [name], NP as PCP - General (Adult Health Nurse Practitioner) Coralie Keens, MD as Consulting Physician (General Surgery) Magrinat, Virgie Dad, MD as Consulting Physician (Oncology) Eppie Gibson, MD as Attending Physician (Radiation Oncology) Rockwell Germany, RN as Oncology Nurse Navigator Mauro Kaufmann, RN as Oncology Nurse Navigator Tennie Grussing, Charlestine Massed, NP as Nurse Practitioner (Hematology and Oncology) Scot Dock, NP OTHER MD:  CHIEF COMPLAINT: Estrogen receptor positive breast cancer  CURRENT TREATMENT: Neoadjuvant chemotherapy   HISTORY OF CURRENT ILLNESS: Catherine Lore "T" presented with pain, erythema, warmth, and skin thickening to the left breast. She was treated with 10 days of Augmentin, which overall improved her symptoms. However, she had a persistent  lump in the upper-outer left breast. She underwent bilateral diagnostic mammography with tomography and bilateral breast ultrasonography at [facility] on 01/30/2021 showing: breast density category C; bilateral fibrocystic changes; 1.4 cm irregular mass in right breast at 3 o'clock; palpable, indeterminate 4.1 cm left breast mass at 2 o'clock; three abnormal nodes in left axilla; right axilla not imaged.  Accordingly on 01/30/2021 she proceeded to biopsy of the right breast area in question. The pathology from this procedure ([id number]) showed: predominantly fatty tissue with focal benign breast tissue.  She also underwent biopsy of the left breast area in question on 02/07/2021. The pathology from this procedure ([id number]) showed: invasive ductal carcinoma, grade 3. Prognostic indicators significant for: estrogen receptor, 70% positive with weak staining intensity and progesterone receptor, 0% negative. Proliferation  marker Ki67 at 40%. HER2 equivocal by immunohistochemistry (2+), but negative by fluorescent in situ hybridization with a signals ratio 1.49 and number per cell 3.50.  She also underwent biopsy of a left axillary node yesterday, 02/12/2021. The pathology ([id number]) confirmed metastatic carcinoma involving nodal tissue.  Cancer Staging Malignant neoplasm of upper-outer quadrant of left breast in female, estrogen receptor positive ([place]) Staging form: Breast, AJCC 8th Edition - Clinical stage from 02/13/2021: Stage IIIA (cT2, cN1, cM0, G3, ER+, PR-, HER2-) - Signed by [name], MD on 02/13/2021 Stage prefix: Initial diagnosis Histologic grading system: 3 grade system Laterality: Left Staged by: Pathologist and managing physician Stage used in treatment planning: Yes National guidelines used in treatment planning: Yes Type of national guideline used in treatment planning: NCCN  The patient's subsequent history is as detailed below.   INTERVAL HISTORY: [name] is here today for f/u of her breast cancer to begin neoadjuvant chemotherapy with weekly Paclitaxel. She started this last week, and is here for week #2.  She received her Paclitaxel last week.  During the infusion she experienced mild chest pain that was tolerable and resolved.  She underwent two dose increases and had no chest pain, and then at the max dose the chest pain returned and she took percocet and improved.    She says that the chest pain returned on Saturday.  She took advil, gabapentin, tramadol and tylenol.  She says that the pain did not resolve, but became a dull ache.  On Sunday she developed significant pain throughout her body.  The pain got so severe that she was in tears.     REVIEW OF SYSTEMS: Review of Systems  Constitutional:  Negative for appetite change, chills, fatigue, fever and unexpected weight change.  HENT:   Negative for hearing loss, lump/mass, mouth sores and  trouble swallowing.   Eyes:   Negative for eye problems and icterus.  Respiratory:  Negative for chest tightness, cough and shortness of breath.   Cardiovascular:  Negative for chest pain, leg swelling and palpitations.  Gastrointestinal:  Negative for abdominal distention, abdominal pain, constipation, diarrhea, nausea and vomiting.  Endocrine: Negative for hot flashes.  Genitourinary:  Negative for difficulty urinating.   Musculoskeletal:  Negative for arthralgias.  Skin:  Negative for itching and rash.  Neurological:  Negative for dizziness, extremity weakness, headaches and numbness.  Hematological:  Negative for adenopathy. Does not bruise/bleed easily.  Psychiatric/Behavioral:  Negative for depression. The patient is not nervous/anxious.      COVID 19 VACCINATION STATUS:    PAST MEDICAL HISTORY: Past Medical History:  Diagnosis Date   Allergy    seasonal    Anemia    Complication of anesthesia    bp dropped with 1st ovary surgery, no problems since with surgery   Dyspnea    with heavy exertion   GERD (gastroesophageal reflux disease)    History of gestational diabetes 2005   Hyperemesis gravidarum    with all pregnancies   IBS (irritable bowel syndrome)    left breast cancer     PAST SURGICAL HISTORY: Past Surgical History:  Procedure Laterality Date   BILATERAL SALPINGECTOMY Bilateral 2014   emergency   LEFT OOPHORECTOMY Left 2006   PORTACATH PLACEMENT Right 02/28/2021   Procedure: INSERTION PORT-A-CATH;  Surgeon: Coralie Keens, MD;  Location: [facility];  Service: General;  Laterality: Right;    FAMILY HISTORY: Family History  Problem Relation Age of Onset   Diabetes Mother    Hypertension Mother    Diabetes Father    Heart disease Father        4V CABG 03/2016, age 53   Hyperlipidemia Father    Hypertension Father    Diabetes Sister    Hypertension Sister    Breast cancer Maternal Aunt        dx. 60s   Hypertension Maternal Grandmother    Colon cancer  Maternal Grandmother        dx. >50   Hypertension Maternal Grandfather    Hypertension Paternal Grandmother    Hypertension Paternal Grandfather    Lung cancer Paternal Grandfather    Her parents are both living-- her mother at age 47 and her father at age 10 as of 01/2021. T has one brother and two sisters. She reports breast cancer in a maternal aunt in her 44's, lung cancer in her paternal grandfather (tobacco user), and colon cancer in her maternal grandmother and a maternal great-aunt (mother's aunt). Of note, one of her sisters underwent genetic testing for keloids, and results were negative.   GYNECOLOGIC HISTORY:  Patient's last menstrual period was 02/27/2021 (exact date). Menarche: 45 years old Age at first live birth: 45 years old [place] P 3 LMP 01/30/2021, irregular and heavy Contraceptive: never used (not currently active) HRT n/a  Hysterectomy? no BSO? 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 She is currently attending classes on Mon, Wed, and Thurs. She is single. She lives at home with son Otho Ket, age 46. Son Michaela Corner, age 39, is a Training and development officer in [place], [place]. Son Royal Ree Edman., age 69, is a Herbalist in [place], [place]. T has two grandchildren, a girl age 10 and a boy age 65, living in [place], [place].  ADVANCED DIRECTIVES: not in place; will likely name her mother, Cynara Tatham, as her HCPOA. She can be reached at [place]: Social History   Tobacco Use   Smoking status: Never   Smokeless tobacco: Never  Vaping Use   Vaping Use: Never used  Substance Use Topics   Alcohol use: Yes    Alcohol/week: 5.0 standard drinks    Types: 1 Glasses of wine, 1 Cans of beer, 1 Shots of liquor, 2 Standard drinks or equivalent per week    Comment: 3-4 drinks per day   Drug use: No     Colonoscopy: yes, performed with [facility] [place] [facility]  PAP: 06/2020  Bone density: never  done (age)   Allergies  Allergen Reactions   Latex Rash    Current Outpatient Medications  Medication Sig Dispense Refill   famotidine (PEPCID) 20 MG tablet Take 1 tablet (20 mg total) by mouth 2 (two) times daily. 60 tablet 5   oxyCODONE (OXY IR/ROXICODONE) 5 MG immediate release tablet Take 0.5-1 tablets (2.5-5 mg total) by mouth every 4 (four) hours as needed for severe pain. 30 tablet 0   acetaminophen (TYLENOL) 325 MG tablet Take 650 mg by mouth every 6 (six) hours as needed.     dicyclomine (BENTYL) 10 MG capsule Take 10 mg by mouth as needed.     gabapentin (NEURONTIN) 300 MG capsule Take 1 capsule (300 mg total) by mouth 3 (three) times daily. 90 capsule 1   ibuprofen (ADVIL) 800 MG tablet Take 800 mg by mouth.     lidocaine-prilocaine (EMLA) cream Apply to affected area once 30 g 3   LINACLOTIDE PO Take 72 mcg by mouth daily. linzess     Melatonin 3 MG TABS Take 1 tablet (3 mg total) by mouth at bedtime. (Patient taking differently: Take 3 mg by mouth at bedtime as needed.) 60 tablet 0   ondansetron (ZOFRAN) 8 MG tablet Take 1 tablet (8 mg total) by mouth 2 (two) times daily as needed (Nausea or vomiting). 30 tablet 1   prochlorperazine (COMPAZINE) 10 MG tablet Take 1 tablet (10 mg total) by mouth every 6 (six) hours as needed (Nausea or vomiting). 30 tablet 1   No current facility-administered medications for this visit.   Facility-Administered Medications Ordered in Other Visits  Medication Dose Route Frequency Provider Last Rate Last Admin   oxyCODONE (Oxy IR/ROXICODONE) 5 MG immediate release tablet             OBJECTIVE: African-American woman who appears younger than stated age  45:   03/07/21 0931  BP: 132/85  Pulse: 88  Resp: 18  Temp: 97.7 F (36.5 C)  SpO2: 100%     Body mass index is 26.9 kg/m.   Wt Readings from Last 3 Encounters:  03/07/21 192 lb 14.4 oz (87.5 kg)  03/01/21 195 lb 4.8 oz (88.6 kg)  02/28/21 192 lb (87.1 kg)      ECOG FS:1 -  Symptomatic but completely ambulatory GENERAL: Patient is a well appearing female in no acute distress HEENT:  Sclerae anicteric.  Oropharynx clear and moist. No ulcerations or evidence of oropharyngeal candidiasis. Neck is supple.  NODES:  No cervical, supraclavicular, or axillary lymphadenopathy palpated.  BREAST EXAM:  left breast with large mass in upper breast softer and slightly smaller LUNGS:  Clear to auscultation bilaterally.  No wheezes or rhonchi. HEART:  Regular rate and rhythm. No murmur appreciated. ABDOMEN:  Soft, nontender.  Positive, normoactive bowel sounds. No  organomegaly palpated. MSK:  No focal spinal tenderness to palpation. Full range of motion bilaterally in the upper extremities. EXTREMITIES:  No peripheral edema.   SKIN:  Clear with no obvious rashes or skin changes. No nail dyscrasia. NEURO:  Nonfocal. Well oriented.  Appropriate affect.    LAB RESULTS:  CMP     Component Value Date/Time   NA 139 03/07/2021 0831   NA 138 08/20/2016 1712   K 4.3 03/07/2021 0831   CL 106 03/07/2021 0831   CO2 23 03/07/2021 0831   GLUCOSE 96 03/07/2021 0831   BUN 15 03/07/2021 0831   BUN 11 08/20/2016 1712   CREATININE 0.86 03/07/2021 0831   CALCIUM 9.1 03/07/2021 0831   PROT 7.1 03/07/2021 0831   PROT 6.9 08/20/2016 1712   ALBUMIN 3.5 03/07/2021 0831   ALBUMIN 4.0 08/20/2016 1712   AST 23 03/07/2021 0831   ALT 24 03/07/2021 0831   ALKPHOS 90 03/07/2021 0831   BILITOT 0.2 (L) 03/07/2021 0831   GFRNONAA >60 03/07/2021 0831   GFRAA 88 08/20/2016 1712    No results found for: Ronnald Ramp, A1GS, A2GS, BETS, BETA2SER, GAMS, MSPIKE, SPEI  Lab Results  Component Value Date   WBC 5.8 03/07/2021   NEUTROABS 3.2 03/07/2021   HGB 8.2 (L) 03/07/2021   HCT 27.3 (L) 03/07/2021   MCV 71.7 (L) 03/07/2021   PLT 302 03/07/2021     Lab Results  Component Value Date   IRON 16 (L) 06/30/2019   TIBC 385 06/30/2019   IRONPCTSAT 4 (LL) 06/30/2019   (Iron and  TIBC)  Lab Results  Component Value Date   FERRITIN 18 06/30/2019    Urinalysis    Component Value Date/Time   LABSPEC >=1.030 04/22/2019 1307   PHURINE 5.5 04/22/2019 1307   GLUCOSEU NEGATIVE 04/22/2019 1307   HGBUR NEGATIVE 04/22/2019 1307   BILIRUBINUR negative 06/30/2019 1343   KETONESUR negative 06/30/2019 [place] 04/22/2019 1307   PROTEINUR negative 06/30/2019 1343   PROTEINUR NEGATIVE 04/22/2019 1307   UROBILINOGEN 0.2 06/30/2019 1343   UROBILINOGEN 0.2 04/22/2019 1307   NITRITE Negative 06/30/2019 1343   NITRITE NEGATIVE 04/22/2019 1307   LEUKOCYTESUR Trace (A) 06/30/2019 1343   LEUKOCYTESUR NEGATIVE 04/22/2019 1307     STUDIES:    ELIGIBLE FOR AVAILABLE RESEARCH PROTOCOL: no  ASSESSMENT: 45 y.o. [place], [place] woman status post left breast upper outer quadrant biopsy 02/07/2021 for a clinical T2 Ni, stage IIIA invasive ductal carcinoma, grade 3, estrogen receptor weakly positive, progesterone receptor and HER2 negative, with an MIB-1 of 40%.  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 Specifically, this variant is p.R247*. (A) Genetic testing identified a variant of uncertain significance (VUS) in the ATM gene called p.[id number]. (B) Colon Cancer Screening recommendations on MUTYH single pathogenic variant: If an individual has a first-degree relative with colorectal cancer, screening should begin at age 28 or 76 years prior to the relative's age at diagnosis, whichever comes first and repeat colonoscopies every 5 years.  Grandmother, and aunt with FH of colon cancer.  (2) neoadjuvant chemotherapy will consist of paclitaxel weekly x12 followed by dose dense doxorubicin and cyclophosphamide x4  (3) definitive surgery to follow  (4) adjuvant radiation as appropriate  (5) antiestrogens.  PLAN:  Lynae is here today  for her second weekly Paclitaxel.  She developed post-taxol pain after the infusion that was very difficult for  her to manage.  I reassured Quintasia that this is often a first dose side effect that resolves after the first cycle.  I have given her a low dose & quantity of oxycodone in case it recurs.  We reviewed goals of resolving her pain which are decreasing her pain to where she is functional and limiting common side effects of opiates that may happen which include constipation and somnolence.    Nafisah has no neuropathy and we are monitoring her closely.  Her tumor feels softer which is a positive sign.  She will return weekly for her treatment and we will see her with every other cycle.  She knows to call for any questions that may arise between now and her next appointment.  We are happy to see her sooner if needed..  Total encounter time 30 minutes.* in face-to-face visit time, chart review, lab review, order entry, and documentation of the encounter.  Wilber Bihari, NP 03/08/21 6:50 AM Medical Oncology and Hematology [facility] [place], [place] [address] Tel. [phone]    Fax. [phone]  *Total Encounter Time as defined by the Centers for Medicare and Medicaid Services includes, in addition to the face-to-face time of a patient visit (documented in the note above) non-face-to-face time: obtaining and reviewing outside history, ordering and reviewing medications, tests or procedures, care coordination (communications with other health care professionals or caregivers) and documentation in the medical record.

## 2021-03-07 ENCOUNTER — Inpatient Hospital Stay: Payer: BC Managed Care – PPO

## 2021-03-07 ENCOUNTER — Other Ambulatory Visit: Payer: Self-pay

## 2021-03-07 ENCOUNTER — Encounter: Payer: Self-pay | Admitting: Adult Health

## 2021-03-07 ENCOUNTER — Inpatient Hospital Stay: Payer: BC Managed Care – PPO | Admitting: General Practice

## 2021-03-07 ENCOUNTER — Inpatient Hospital Stay (HOSPITAL_BASED_OUTPATIENT_CLINIC_OR_DEPARTMENT_OTHER): Payer: BC Managed Care – PPO | Admitting: Adult Health

## 2021-03-07 ENCOUNTER — Encounter: Payer: Self-pay | Admitting: *Deleted

## 2021-03-07 VITALS — BP 132/85 | HR 88 | Temp 97.7°F | Resp 18 | Ht 71.0 in | Wt 192.9 lb

## 2021-03-07 VITALS — BP 129/78

## 2021-03-07 DIAGNOSIS — Z1589 Genetic susceptibility to other disease: Secondary | ICD-10-CM | POA: Diagnosis not present

## 2021-03-07 DIAGNOSIS — C50412 Malignant neoplasm of upper-outer quadrant of left female breast: Secondary | ICD-10-CM

## 2021-03-07 DIAGNOSIS — Z95828 Presence of other vascular implants and grafts: Secondary | ICD-10-CM

## 2021-03-07 DIAGNOSIS — Z800 Family history of malignant neoplasm of digestive organs: Secondary | ICD-10-CM | POA: Diagnosis not present

## 2021-03-07 DIAGNOSIS — Z170 Estrogen receptor positive status [ER+]: Secondary | ICD-10-CM

## 2021-03-07 LAB — CBC WITH DIFFERENTIAL (CANCER CENTER ONLY)
Abs Immature Granulocytes: 0.05 10*3/uL (ref 0.00–0.07)
Basophils Absolute: 0.1 10*3/uL (ref 0.0–0.1)
Basophils Relative: 1 %
Eosinophils Absolute: 0.2 10*3/uL (ref 0.0–0.5)
Eosinophils Relative: 4 %
HCT: 27.3 % — ABNORMAL LOW (ref 36.0–46.0)
Hemoglobin: 8.2 g/dL — ABNORMAL LOW (ref 12.0–15.0)
Immature Granulocytes: 1 %
Lymphocytes Relative: 34 %
Lymphs Abs: 2 10*3/uL (ref 0.7–4.0)
MCH: 21.5 pg — ABNORMAL LOW (ref 26.0–34.0)
MCHC: 30 g/dL (ref 30.0–36.0)
MCV: 71.7 fL — ABNORMAL LOW (ref 80.0–100.0)
Monocytes Absolute: 0.3 10*3/uL (ref 0.1–1.0)
Monocytes Relative: 5 %
Neutro Abs: 3.2 10*3/uL (ref 1.7–7.7)
Neutrophils Relative %: 55 %
Platelet Count: 302 10*3/uL (ref 150–400)
RBC: 3.81 MIL/uL — ABNORMAL LOW (ref 3.87–5.11)
RDW: 18.5 % — ABNORMAL HIGH (ref 11.5–15.5)
WBC Count: 5.8 10*3/uL (ref 4.0–10.5)
nRBC: 0 % (ref 0.0–0.2)

## 2021-03-07 LAB — CMP (CANCER CENTER ONLY)
ALT: 24 U/L (ref 0–44)
AST: 23 U/L (ref 15–41)
Albumin: 3.5 g/dL (ref 3.5–5.0)
Alkaline Phosphatase: 90 U/L (ref 38–126)
Anion gap: 10 (ref 5–15)
BUN: 15 mg/dL (ref 6–20)
CO2: 23 mmol/L (ref 22–32)
Calcium: 9.1 mg/dL (ref 8.9–10.3)
Chloride: 106 mmol/L (ref 98–111)
Creatinine: 0.86 mg/dL (ref 0.44–1.00)
GFR, Estimated: >60 mL/min (ref >60)
Glucose, Bld: 96 mg/dL (ref 70–99)
Potassium: 4.3 mmol/L (ref 3.5–5.1)
Sodium: 139 mmol/L (ref 135–145)
Total Bilirubin: 0.2 mg/dL — ABNORMAL LOW (ref 0.3–1.2)
Total Protein: 7.1 g/dL (ref 6.5–8.1)

## 2021-03-07 MED ORDER — FAMOTIDINE 20 MG PO TABS: 20.0000 mg | ORAL_TABLET | Freq: Two times a day (BID) | ORAL | 5 refills | Status: DC

## 2021-03-07 MED ORDER — OXYCODONE HCL 5 MG PO TABS: 2.5000 mg | ORAL_TABLET | ORAL | 0 refills | Status: DC | PRN

## 2021-03-07 MED ORDER — HEPARIN SOD (PORK) LOCK FLUSH 100 UNIT/ML IV SOLN
500.0000 [IU] | Freq: Once | INTRAVENOUS | Status: AC | PRN
  Administered 2021-03-07: 500 [IU]

## 2021-03-07 MED ORDER — SODIUM CHLORIDE 0.9% FLUSH
10.0000 mL | Freq: Once | INTRAVENOUS | Status: AC
  Administered 2021-03-07: 10 mL via INTRAVENOUS

## 2021-03-07 MED ORDER — SODIUM CHLORIDE 0.9 % IV SOLN
10.0000 mg | Freq: Once | INTRAVENOUS | Status: AC
  Administered 2021-03-07: 10 mg via INTRAVENOUS
  Filled 2021-03-07: qty 10

## 2021-03-07 MED ORDER — SODIUM CHLORIDE 0.9 % IV SOLN: Freq: Once | INTRAVENOUS | Status: AC

## 2021-03-07 MED ORDER — SODIUM CHLORIDE 0.9% FLUSH
10.0000 mL | INTRAVENOUS | Status: DC | PRN
  Administered 2021-03-07: 10 mL

## 2021-03-07 MED ORDER — FAMOTIDINE 20 MG IN NS 100 ML IVPB
20.0000 mg | Freq: Once | INTRAVENOUS | Status: AC
  Administered 2021-03-07: 20 mg via INTRAVENOUS
  Filled 2021-03-07: qty 100

## 2021-03-07 MED ORDER — SODIUM CHLORIDE 0.9 % IV SOLN
80.0000 mg/m2 | Freq: Once | INTRAVENOUS | Status: AC
  Administered 2021-03-07: 168 mg via INTRAVENOUS
  Filled 2021-03-07: qty 28

## 2021-03-07 MED ORDER — DIPHENHYDRAMINE HCL 50 MG/ML IJ SOLN
25.0000 mg | Freq: Once | INTRAMUSCULAR | Status: AC
Start: 2021-03-07 — End: 2021-03-07
  Administered 2021-03-07: 25 mg via INTRAVENOUS
  Filled 2021-03-07: qty 1

## 2021-03-07 NOTE — Progress Notes (Signed)
[facility] CSW Progress Notes  Met w patient in infusion.  Reviewed various applications for financial assistance related to breast cancer and cancer support in [place] [facility] area.  She will complete these applications and we will meet next week to review/submit.  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  She hopes to finish school and has support from her teachers to do so.  Appointment made to meet her in infusion next week.  Anne Cunningham, LCSW Clinical Social Worker Phone:  [phone]  

## 2021-03-07 NOTE — Patient Instructions (Signed)
[facility]COL[facility]  Discharge Instructions: Thank you for choosing [facility] to provide your oncology and hematology care.   If you have a lab appointment with the [facility], please go directly to the [facility] and check in at the registration area.   Wear comfortable clothing and clothing appropriate for easy access to any Portacath or PICC line.   We strive to give you quality time with your provider. You may need to reschedule your appointment if you arrive late (15 or more minutes).  Arriving late affects you and other patients whose appointments are after yours.  Also, if you miss three or more appointments without notifying the office, you may be dismissed from the clinic at the provider's discretion.      For prescription refill requests, have your pharmacy contact our office and allow 72 hours for refills to be completed.    Today you received the following chemotherapy and/or immunotherapy agents Taxol      To help prevent nausea and vomiting after your treatment, we encourage you to take your nausea medication as directed.  BELOW ARE SYMPTOMS THAT SHOULD BE REPORTED IMMEDIATELY: *FEVER GREATER THAN 100.4 F (38 C) OR HIGHER *CHILLS OR SWEATING *NAUSEA AND VOMITING THAT IS NOT CONTROLLED WITH YOUR NAUSEA MEDICATION *UNUSUAL SHORTNESS OF BREATH *UNUSUAL BRUISING OR BLEEDING *URINARY PROBLEMS (pain or burning when urinating, or frequent urination) *BOWEL PROBLEMS (unusual diarrhea, constipation, pain near the anus) TENDERNESS IN MOUTH AND THROAT WITH OR WITHOUT PRESENCE OF ULCERS (sore throat, sores in mouth, or a toothache) UNUSUAL RASH, SWELLING OR PAIN  UNUSUAL VAGINAL DISCHARGE OR ITCHING   Items with * indicate a potential emergency and should be followed up as soon as possible or go to the Emergency Department if any problems should occur.  Please show the CHEMOTHERAPY ALERT CARD or IMMUNOTHERAPY ALERT CARD at check-in to the  Emergency Department and triage nurse.  Should you have questions after your visit or need to cancel or reschedule your appointment, please contact [facility]  Dept: [phone]  and follow the prompts.  Office hours are 8:00 a.m. to 4:30 p.m. Monday - Friday. Please note that voicemails left after 4:00 p.m. may not be returned until the following business day.  We are closed weekends and major holidays. You have access to a nurse at all times for urgent questions. Please call the main number to the clinic Dept: [phone] and follow the prompts.   For any non-urgent questions, you may also contact your provider using MyChart. We now offer e-Visits for anyone 47 and older to request care online for non-urgent symptoms. For details visit [email or web address].   Also download the MyChart app! Go to the app store, search "MyChart", open the app, select [facility], and log in with your MyChart username and password.  Due to Covid, a mask is required upon entering the hospital/clinic. If you do not have a mask, one will be given to you upon arrival. For doctor visits, patients may have 1 support person aged 91 or older with them. For treatment visits, patients cannot have anyone with them due to current Covid guidelines and our immunocompromised population.

## 2021-03-08 ENCOUNTER — Encounter: Payer: Self-pay | Admitting: Oncology

## 2021-03-14 ENCOUNTER — Inpatient Hospital Stay: Payer: BC Managed Care – PPO | Admitting: General Practice

## 2021-03-14 ENCOUNTER — Encounter: Payer: Self-pay | Admitting: General Practice

## 2021-03-14 ENCOUNTER — Encounter: Payer: Self-pay | Admitting: Genetic Counselor

## 2021-03-14 ENCOUNTER — Inpatient Hospital Stay: Payer: BC Managed Care – PPO

## 2021-03-14 ENCOUNTER — Other Ambulatory Visit: Payer: Self-pay

## 2021-03-14 VITALS — BP 132/84 | HR 73 | Temp 98.9°F | Resp 18

## 2021-03-14 DIAGNOSIS — Z95828 Presence of other vascular implants and grafts: Secondary | ICD-10-CM

## 2021-03-14 DIAGNOSIS — Z170 Estrogen receptor positive status [ER+]: Secondary | ICD-10-CM

## 2021-03-14 DIAGNOSIS — C50412 Malignant neoplasm of upper-outer quadrant of left female breast: Secondary | ICD-10-CM

## 2021-03-14 HISTORY — DX: Presence of other vascular implants and grafts: Z95.828

## 2021-03-14 LAB — CMP (CANCER CENTER ONLY)
ALT: 16 U/L (ref 0–44)
AST: 17 U/L (ref 15–41)
Albumin: 3.6 g/dL (ref 3.5–5.0)
Alkaline Phosphatase: 96 U/L (ref 38–126)
Anion gap: 8 (ref 5–15)
BUN: 11 mg/dL (ref 6–20)
CO2: 23 mmol/L (ref 22–32)
Calcium: 8.7 mg/dL — ABNORMAL LOW (ref 8.9–10.3)
Chloride: 106 mmol/L (ref 98–111)
Creatinine: 0.92 mg/dL (ref 0.44–1.00)
GFR, Estimated: >60 mL/min (ref >60)
Glucose, Bld: 100 mg/dL — ABNORMAL HIGH (ref 70–99)
Potassium: 3.8 mmol/L (ref 3.5–5.1)
Sodium: 137 mmol/L (ref 135–145)
Total Bilirubin: 0.5 mg/dL (ref 0.3–1.2)
Total Protein: 6.9 g/dL (ref 6.5–8.1)

## 2021-03-14 LAB — CBC WITH DIFFERENTIAL (CANCER CENTER ONLY)
Abs Immature Granulocytes: 0.01 10*3/uL (ref 0.00–0.07)
Basophils Absolute: 0 10*3/uL (ref 0.0–0.1)
Basophils Relative: 1 %
Eosinophils Absolute: 0.1 10*3/uL (ref 0.0–0.5)
Eosinophils Relative: 2 %
HCT: 27.3 % — ABNORMAL LOW (ref 36.0–46.0)
Hemoglobin: 8.4 g/dL — ABNORMAL LOW (ref 12.0–15.0)
Immature Granulocytes: 0 %
Lymphocytes Relative: 48 %
Lymphs Abs: 1.3 10*3/uL (ref 0.7–4.0)
MCH: 22.2 pg — ABNORMAL LOW (ref 26.0–34.0)
MCHC: 30.8 g/dL (ref 30.0–36.0)
MCV: 72 fL — ABNORMAL LOW (ref 80.0–100.0)
Monocytes Absolute: 0.3 10*3/uL (ref 0.1–1.0)
Monocytes Relative: 11 %
Neutro Abs: 1.1 10*3/uL — ABNORMAL LOW (ref 1.7–7.7)
Neutrophils Relative %: 38 %
Platelet Count: 336 10*3/uL (ref 150–400)
RBC: 3.79 MIL/uL — ABNORMAL LOW (ref 3.87–5.11)
RDW: 19.9 % — ABNORMAL HIGH (ref 11.5–15.5)
Smear Review: NORMAL
WBC Count: 2.8 10*3/uL — ABNORMAL LOW (ref 4.0–10.5)
nRBC: 0 % (ref 0.0–0.2)

## 2021-03-14 MED ORDER — SODIUM CHLORIDE 0.9 % IV SOLN
80.0000 mg/m2 | Freq: Once | INTRAVENOUS | Status: AC
  Administered 2021-03-14: 168 mg via INTRAVENOUS
  Filled 2021-03-14: qty 28

## 2021-03-14 MED ORDER — SODIUM CHLORIDE 0.9% FLUSH
10.0000 mL | Freq: Once | INTRAVENOUS | Status: AC
  Administered 2021-03-14: 10 mL

## 2021-03-14 MED ORDER — HEPARIN SOD (PORK) LOCK FLUSH 100 UNIT/ML IV SOLN
500.0000 [IU] | Freq: Once | INTRAVENOUS | Status: AC | PRN
  Administered 2021-03-14: 500 [IU]

## 2021-03-14 MED ORDER — SODIUM CHLORIDE 0.9 % IV SOLN: Freq: Once | INTRAVENOUS | Status: AC

## 2021-03-14 MED ORDER — SODIUM CHLORIDE 0.9 % IV SOLN
10.0000 mg | Freq: Once | INTRAVENOUS | Status: AC
  Administered 2021-03-14: 10 mg via INTRAVENOUS
  Filled 2021-03-14: qty 10

## 2021-03-14 MED ORDER — SODIUM CHLORIDE 0.9% FLUSH
10.0000 mL | INTRAVENOUS | Status: DC | PRN
  Administered 2021-03-14: 10 mL

## 2021-03-14 MED ORDER — DIPHENHYDRAMINE HCL 50 MG/ML IJ SOLN
25.0000 mg | Freq: Once | INTRAMUSCULAR | Status: AC
  Administered 2021-03-14: 25 mg via INTRAVENOUS
  Filled 2021-03-14: qty 1

## 2021-03-14 MED ORDER — FAMOTIDINE 20 MG IN NS 100 ML IVPB
20.0000 mg | Freq: Once | INTRAVENOUS | Status: AC
  Administered 2021-03-14: 20 mg via INTRAVENOUS
  Filled 2021-03-14: qty 100

## 2021-03-14 NOTE — Progress Notes (Signed)
[facility] CSW Progress Notes  Met w patient in infusion, she provided completed [employer] assistance request form.  Submitted via secure email.  She is waiting for documents from employer in order to submit [employer] application.  She will let us know when she has these required documents.  Requested to see [place] as she would like to apply for [employer] - message sent.  Edwyna Shell, LCSW Clinical Social Worker Phone:  [phone]

## 2021-03-14 NOTE — Progress Notes (Signed)
OK to trt w/ ANC of 1.1 per Dr. Jana Hakim

## 2021-03-14 NOTE — Patient Instructions (Signed)
[facility]COL[facility]  Discharge Instructions: Thank you for choosing [facility] to provide your oncology and hematology care.   If you have a lab appointment with the [facility], please go directly to the [facility] and check in at the registration area.   Wear comfortable clothing and clothing appropriate for easy access to any Portacath or PICC line.   We strive to give you quality time with your provider. You may need to reschedule your appointment if you arrive late (15 or more minutes).  Arriving late affects you and other patients whose appointments are after yours.  Also, if you miss three or more appointments without notifying the office, you may be dismissed from the clinic at the provider's discretion.      For prescription refill requests, have your pharmacy contact our office and allow 72 hours for refills to be completed.    Today you received the following chemotherapy and/or immunotherapy agents: Taxol    To help prevent nausea and vomiting after your treatment, we encourage you to take your nausea medication as directed.  BELOW ARE SYMPTOMS THAT SHOULD BE REPORTED IMMEDIATELY: *FEVER GREATER THAN 100.4 F (38 C) OR HIGHER *CHILLS OR SWEATING *NAUSEA AND VOMITING THAT IS NOT CONTROLLED WITH YOUR NAUSEA MEDICATION *UNUSUAL SHORTNESS OF BREATH *UNUSUAL BRUISING OR BLEEDING *URINARY PROBLEMS (pain or burning when urinating, or frequent urination) *BOWEL PROBLEMS (unusual diarrhea, constipation, pain near the anus) TENDERNESS IN MOUTH AND THROAT WITH OR WITHOUT PRESENCE OF ULCERS (sore throat, sores in mouth, or a toothache) UNUSUAL RASH, SWELLING OR PAIN  UNUSUAL VAGINAL DISCHARGE OR ITCHING   Items with * indicate a potential emergency and should be followed up as soon as possible or go to the Emergency Department if any problems should occur.  Please show the CHEMOTHERAPY ALERT CARD or IMMUNOTHERAPY ALERT CARD at check-in to the  Emergency Department and triage nurse.  Should you have questions after your visit or need to cancel or reschedule your appointment, please contact [facility]  Dept: [phone]  and follow the prompts.  Office hours are 8:00 a.m. to 4:30 p.m. Monday - Friday. Please note that voicemails left after 4:00 p.m. may not be returned until the following business day.  We are closed weekends and major holidays. You have access to a nurse at all times for urgent questions. Please call the main number to the clinic Dept: [phone] and follow the prompts.   For any non-urgent questions, you may also contact your provider using MyChart. We now offer e-Visits for anyone 70 and older to request care online for non-urgent symptoms. For details visit [email or web address].   Also download the MyChart app! Go to the app store, search "MyChart", open the app, select [facility], and log in with your MyChart username and password.  Due to Covid, a mask is required upon entering the hospital/clinic. If you do not have a mask, one will be given to you upon arrival. For doctor visits, patients may have 1 support person aged 25 or older with them. For treatment visits, patients cannot have anyone with them due to current Covid guidelines and our immunocompromised population.

## 2021-03-19 ENCOUNTER — Encounter: Payer: Self-pay | Admitting: Gastroenterology

## 2021-03-20 MED FILL — Dexamethasone Sodium Phosphate Inj 100 MG/10ML: INTRAMUSCULAR | Qty: 1 | Status: AC

## 2021-03-21 ENCOUNTER — Inpatient Hospital Stay (HOSPITAL_BASED_OUTPATIENT_CLINIC_OR_DEPARTMENT_OTHER): Payer: BC Managed Care – PPO | Admitting: Adult Health

## 2021-03-21 ENCOUNTER — Inpatient Hospital Stay: Payer: BC Managed Care – PPO

## 2021-03-21 ENCOUNTER — Encounter: Payer: Self-pay | Admitting: Adult Health

## 2021-03-21 ENCOUNTER — Inpatient Hospital Stay: Payer: BC Managed Care – PPO | Attending: Oncology

## 2021-03-21 ENCOUNTER — Other Ambulatory Visit: Payer: Self-pay

## 2021-03-21 VITALS — HR 98

## 2021-03-21 VITALS — BP 104/83 | HR 104 | Temp 97.7°F | Resp 17 | Ht 71.0 in | Wt 187.5 lb

## 2021-03-21 DIAGNOSIS — Z5111 Encounter for antineoplastic chemotherapy: Secondary | ICD-10-CM | POA: Diagnosis present

## 2021-03-21 DIAGNOSIS — C773 Secondary and unspecified malignant neoplasm of axilla and upper limb lymph nodes: Secondary | ICD-10-CM | POA: Insufficient documentation

## 2021-03-21 DIAGNOSIS — Z170 Estrogen receptor positive status [ER+]: Secondary | ICD-10-CM

## 2021-03-21 DIAGNOSIS — C50412 Malignant neoplasm of upper-outer quadrant of left female breast: Secondary | ICD-10-CM

## 2021-03-21 DIAGNOSIS — Z95828 Presence of other vascular implants and grafts: Secondary | ICD-10-CM

## 2021-03-21 LAB — CMP (CANCER CENTER ONLY)
ALT: 10 U/L (ref 0–44)
AST: 16 U/L (ref 15–41)
Albumin: 3.7 g/dL (ref 3.5–5.0)
Alkaline Phosphatase: 91 U/L (ref 38–126)
Anion gap: 8 (ref 5–15)
BUN: 15 mg/dL (ref 6–20)
CO2: 26 mmol/L (ref 22–32)
Calcium: 8.8 mg/dL — ABNORMAL LOW (ref 8.9–10.3)
Chloride: 107 mmol/L (ref 98–111)
Creatinine: 0.86 mg/dL (ref 0.44–1.00)
GFR, Estimated: >60 mL/min (ref >60)
Glucose, Bld: 99 mg/dL (ref 70–99)
Potassium: 3.8 mmol/L (ref 3.5–5.1)
Sodium: 141 mmol/L (ref 135–145)
Total Bilirubin: 0.5 mg/dL (ref 0.3–1.2)
Total Protein: 7.1 g/dL (ref 6.5–8.1)

## 2021-03-21 LAB — CBC WITH DIFFERENTIAL (CANCER CENTER ONLY)
Abs Immature Granulocytes: 0.03 10*3/uL (ref 0.00–0.07)
Basophils Absolute: 0.1 10*3/uL (ref 0.0–0.1)
Basophils Relative: 1 %
Eosinophils Absolute: 0.1 10*3/uL (ref 0.0–0.5)
Eosinophils Relative: 2 %
HCT: 28.8 % — ABNORMAL LOW (ref 36.0–46.0)
Hemoglobin: 8.7 g/dL — ABNORMAL LOW (ref 12.0–15.0)
Immature Granulocytes: 1 %
Lymphocytes Relative: 43 %
Lymphs Abs: 2.4 10*3/uL (ref 0.7–4.0)
MCH: 22.3 pg — ABNORMAL LOW (ref 26.0–34.0)
MCHC: 30.2 g/dL (ref 30.0–36.0)
MCV: 73.8 fL — ABNORMAL LOW (ref 80.0–100.0)
Monocytes Absolute: 0.3 10*3/uL (ref 0.1–1.0)
Monocytes Relative: 5 %
Neutro Abs: 2.8 10*3/uL (ref 1.7–7.7)
Neutrophils Relative %: 48 %
Platelet Count: 470 10*3/uL — ABNORMAL HIGH (ref 150–400)
RBC: 3.9 MIL/uL (ref 3.87–5.11)
RDW: 20.4 % — ABNORMAL HIGH (ref 11.5–15.5)
WBC Count: 5.6 10*3/uL (ref 4.0–10.5)
nRBC: 0 % (ref 0.0–0.2)

## 2021-03-21 MED ORDER — SODIUM CHLORIDE 0.9% FLUSH
10.0000 mL | INTRAVENOUS | Status: DC | PRN
  Administered 2021-03-21: 10 mL

## 2021-03-21 MED ORDER — PANTOPRAZOLE SODIUM 40 MG PO TBEC: 40.0000 mg | DELAYED_RELEASE_TABLET | Freq: Every day | ORAL | 1 refills | Status: DC

## 2021-03-21 MED ORDER — SODIUM CHLORIDE 0.9 % IV SOLN
80.0000 mg/m2 | Freq: Once | INTRAVENOUS | Status: AC
  Administered 2021-03-21: 168 mg via INTRAVENOUS
  Filled 2021-03-21: qty 28

## 2021-03-21 MED ORDER — FAMOTIDINE 20 MG IN NS 100 ML IVPB
20.0000 mg | Freq: Once | INTRAVENOUS | Status: AC
  Administered 2021-03-21: 20 mg via INTRAVENOUS
  Filled 2021-03-21: qty 100

## 2021-03-21 MED ORDER — SODIUM CHLORIDE 0.9 % IV SOLN: Freq: Once | INTRAVENOUS | Status: AC

## 2021-03-21 MED ORDER — SODIUM CHLORIDE 0.9 % IV SOLN
10.0000 mg | Freq: Once | INTRAVENOUS | Status: AC
  Administered 2021-03-21: 10 mg via INTRAVENOUS
  Filled 2021-03-21: qty 10

## 2021-03-21 MED ORDER — VENLAFAXINE HCL ER 37.5 MG PO CP24: 37.5000 mg | ORAL_CAPSULE | Freq: Every day | ORAL | 1 refills | Status: DC

## 2021-03-21 MED ORDER — HEPARIN SOD (PORK) LOCK FLUSH 100 UNIT/ML IV SOLN
500.0000 [IU] | Freq: Once | INTRAVENOUS | Status: AC | PRN
  Administered 2021-03-21: 500 [IU]

## 2021-03-21 MED ORDER — SODIUM CHLORIDE 0.9% FLUSH
10.0000 mL | Freq: Once | INTRAVENOUS | Status: AC
  Administered 2021-03-21: 10 mL

## 2021-03-21 MED ORDER — DIPHENHYDRAMINE HCL 50 MG/ML IJ SOLN
25.0000 mg | Freq: Once | INTRAMUSCULAR | Status: AC
  Administered 2021-03-21: 25 mg via INTRAVENOUS
  Filled 2021-03-21: qty 1

## 2021-03-21 NOTE — Progress Notes (Signed)
Met with patient at registration to complete grant process.  Patient approved for one-time $1000 [employer] grant to assist with personal expenses while going through treatment. Discussed in detail expenses and how they are covered. She has a copy of the approval letter and expense sheet along with the Outpatient pharmacy information. She received a gift card today from her grant.  She has my card for any additional financial questions or concerns. She also has a green folder with all of her paperwork inside.

## 2021-03-21 NOTE — Patient Instructions (Signed)
Venlafaxine Extended-Release Capsules What is this medication? VENLAFAXINE (VEN la fax een) treats depression and anxiety. It increases the amount of serotonin and norepinephrine in the brain, hormones that help regulate mood. It belongs to a group of medications called SNRIs. This medicine may be used for other purposes; ask your health care provider or pharmacist if you have questions. COMMON BRAND NAME(S): Effexor XR What should I tell my care team before I take this medication? They need to know if you have any of these conditions: Bleeding disorders Glaucoma Heart disease High blood pressure High cholesterol Kidney disease Liver disease Low levels of sodium in the blood Mania or bipolar disorder Seizures Suicidal thoughts, plans, or attempt; a previous suicide attempt by you or a family Take medications that treat or prevent blood clots Thyroid disease An unusual or allergic reaction to venlafaxine, desvenlafaxine, other medications, foods, dyes, or preservatives Pregnant or trying to get pregnant Breast-feeding How should I use this medication? Take this medication by mouth with a full glass of water. Follow the directions on the prescription label. Do not cut, crush, or chew this medication. Take it with food. If needed, the capsule may be carefully opened and the entire contents sprinkled on a spoonful of cool applesauce. Swallow the applesauce/pellet mixture right away without chewing and follow with a glass of water to ensure complete swallowing of the pellets. Try to take your medication at about the same time each day. Do not take your medication more often than directed. Do not stop taking this medication suddenly except upon the advice of your care team. Stopping this medication too quickly may cause serious side effects or your condition may worsen. A special MedGuide will be given to you by the pharmacist with each prescription and refill. Be sure to read this information  carefully each time. Talk to your care team regarding the use of this medication in children. Special care may be needed. Overdosage: If you think you have taken too much of this medicine contact a poison control center or emergency room at once. NOTE: This medicine is only for you. Do not share this medicine with others. What if I miss a dose? If you miss a dose, take it as soon as you can. If it is almost time for your next dose, take only that dose. Do not take double or extra doses. What may interact with this medication? Do not take this medication with any of the following: Certain medications for fungal infections like fluconazole, itraconazole, ketoconazole, posaconazole, voriconazole Cisapride Desvenlafaxine Dronedarone Duloxetine Levomilnacipran Linezolid MAOIs like Carbex, Eldepryl, Marplan, Nardil, and Parnate Methylene blue (injected into a vein) Milnacipran Pimozide Thioridazine This medication may also interact with the following: Amphetamines Aspirin and aspirin-like medications Certain medications for depression, anxiety, or psychotic disturbances Certain medications for migraine headaches like almotriptan, eletriptan, frovatriptan, naratriptan, rizatriptan, sumatriptan, zolmitriptan Certain medications for sleep Certain medications that treat or prevent blood clots like dalteparin, enoxaparin, warfarin Cimetidine Clozapine Diuretics Fentanyl Furazolidone Indinavir Isoniazid Lithium Metoprolol NSAIDS, medications for pain and inflammation, like ibuprofen or naproxen Other medications that prolong the QT interval (cause an abnormal heart rhythm) like dofetilide, ziprasidone Procarbazine Rasagiline Supplements like St. John's wort, kava kava, valerian Tramadol Tryptophan This list may not describe all possible interactions. Give your health care provider a list of all the medicines, herbs, non-prescription drugs, or dietary supplements you use. Also tell them  if you smoke, drink alcohol, or use illegal drugs. Some items may interact with your medicine. What should   I watch for while using this medication? Tell your care team if your symptoms do not get better or if they get worse. Visit your care team for regular checks on your progress. Because it may take several weeks to see the full effects of this medication, it is important to continue your treatment as prescribed by your care team. Watch for new or worsening thoughts of suicide or depression. This includes sudden changes in mood, behaviors, or thoughts. These changes can happen at any time but are more common in the beginning of treatment or after a change in dose. Call your care team right away if you experience these thoughts or worsening depression. Manic episodes may happen in patients with bipolar disorder who take this medication. Watch for changes in feelings or behaviors such as feeling anxious, nervous, agitated, panicky, irritable, hostile, aggressive, impulsive, severely restless, overly excited and hyperactive, or trouble sleeping. These changes can happen at any time but are more common in the beginning of treatment or after a change in dose. Call your care team right away if you notice any of these symptoms. This medication can cause an increase in blood pressure. Check with your care team for instructions on monitoring your blood pressure while taking this medication. You may get drowsy or dizzy. Do not drive, use machinery, or do anything that needs mental alertness until you know how this medication affects you. Do not stand or sit up quickly, especially if you are an older patient. This reduces the risk of dizzy or fainting spells. Alcohol may interfere with the effect of this medication. Avoid alcoholic drinks. Your mouth may get dry. Chewing sugarless gum, sucking hard candy and drinking plenty of water will help. Contact your care team if the problem does not go away or is severe. What  side effects may I notice from receiving this medication? Side effects that you should report to your care team as soon as possible: Allergic reactions-skin rash, itching, hives, swelling of the face, lips, tongue, or throat Bleeding-bloody or black, tar-like stools, red or dark brown urine, vomiting blood or brown material that looks like coffee grounds, small, red or purple spots on skin, unusual bleeding or bruising Heart rhythm changes-fast or irregular heartbeat, dizziness, feeling faint or lightheaded, chest pain, trouble breathing Increase in blood pressure Loss of appetite with weight loss Low sodium level-muscle weakness, fatigue, dizziness, headache, confusion Serotonin syndrome-irritability, confusion, fast or irregular heartbeat, muscle stiffness, twitching muscles, sweating, high fever, seizures, chills, vomiting, diarrhea Sudden eye pain or change in vision such as blurry vision, seeing halos around lights, vision loss Thoughts of suicide or self-harm, worsening mood, feelings of depression Side effects that usually do not require medical attention (report to your care team if they continue or are bothersome): Anxiety, nervousness Change in sex drive or performance Dizziness Dry mouth Excessive sweating Nausea Tremors or shaking Trouble sleeping This list may not describe all possible side effects. Call your doctor for medical advice about side effects. You may report side effects to FDA at 1-800-FDA-1088. Where should I keep my medication? Keep out of the reach of children and pets. Store at a controlled temperature between 20 and 25 degrees C (68 degrees and 77 degrees F), in a dry place. Throw away any unused medication after the expiration date. NOTE: This sheet is a summary. It may not cover all possible information. If you have questions about this medicine, talk to your doctor, pharmacist, or health care provider.  2022 Elsevier/Gold Standard (2020-03-26  14:56:43)  

## 2021-03-21 NOTE — Patient Instructions (Signed)
[facility]COL[facility]  Discharge Instructions: Thank you for choosing [facility] to provide your oncology and hematology care.   If you have a lab appointment with the [facility], please go directly to the [facility] and check in at the registration area.   Wear comfortable clothing and clothing appropriate for easy access to any Portacath or PICC line.   We strive to give you quality time with your provider. You may need to reschedule your appointment if you arrive late (15 or more minutes).  Arriving late affects you and other patients whose appointments are after yours.  Also, if you miss three or more appointments without notifying the office, you may be dismissed from the clinic at the provider's discretion.      For prescription refill requests, have your pharmacy contact our office and allow 72 hours for refills to be completed.    Today you received the following chemotherapy and/or immunotherapy agents: Taxol    To help prevent nausea and vomiting after your treatment, we encourage you to take your nausea medication as directed.  BELOW ARE SYMPTOMS THAT SHOULD BE REPORTED IMMEDIATELY: *FEVER GREATER THAN 100.4 F (38 C) OR HIGHER *CHILLS OR SWEATING *NAUSEA AND VOMITING THAT IS NOT CONTROLLED WITH YOUR NAUSEA MEDICATION *UNUSUAL SHORTNESS OF BREATH *UNUSUAL BRUISING OR BLEEDING *URINARY PROBLEMS (pain or burning when urinating, or frequent urination) *BOWEL PROBLEMS (unusual diarrhea, constipation, pain near the anus) TENDERNESS IN MOUTH AND THROAT WITH OR WITHOUT PRESENCE OF ULCERS (sore throat, sores in mouth, or a toothache) UNUSUAL RASH, SWELLING OR PAIN  UNUSUAL VAGINAL DISCHARGE OR ITCHING   Items with * indicate a potential emergency and should be followed up as soon as possible or go to the Emergency Department if any problems should occur.  Please show the CHEMOTHERAPY ALERT CARD or IMMUNOTHERAPY ALERT CARD at check-in to the  Emergency Department and triage nurse.  Should you have questions after your visit or need to cancel or reschedule your appointment, please contact [facility]  Dept: [phone]  and follow the prompts.  Office hours are 8:00 a.m. to 4:30 p.m. Monday - Friday. Please note that voicemails left after 4:00 p.m. may not be returned until the following business day.  We are closed weekends and major holidays. You have access to a nurse at all times for urgent questions. Please call the main number to the clinic Dept: [phone] and follow the prompts.   For any non-urgent questions, you may also contact your provider using MyChart. We now offer e-Visits for anyone 70 and older to request care online for non-urgent symptoms. For details visit [email or web address].   Also download the MyChart app! Go to the app store, search "MyChart", open the app, select [facility], and log in with your MyChart username and password.  Due to Covid, a mask is required upon entering the hospital/clinic. If you do not have a mask, one will be given to you upon arrival. For doctor visits, patients may have 1 support person aged 25 or older with them. For treatment visits, patients cannot have anyone with them due to current Covid guidelines and our immunocompromised population.

## 2021-03-21 NOTE — Progress Notes (Signed)
[facility]  Telephone:([phone]) [phone] Fax:(336) [phone]     ID: MARKEESHA CHAR DOB: September 10, 1975  MR#: [id number]  SHU#:[id number]  Patient Care Team: [name], NP as PCP - General (Adult Health Nurse Practitioner) Coralie Keens, MD as Consulting Physician (General Surgery) Magrinat, Virgie Dad, MD as Consulting Physician (Oncology) Eppie Gibson, MD as Attending Physician (Radiation Oncology) Rockwell Germany, RN as Oncology Nurse Navigator Mauro Kaufmann, RN as Oncology Nurse Navigator Minka Knight, Charlestine Massed, NP as Nurse Practitioner (Hematology and Oncology) Scot Dock, NP OTHER MD:  CHIEF COMPLAINT: Estrogen receptor positive breast cancer  CURRENT TREATMENT: Neoadjuvant chemotherapy   HISTORY OF CURRENT ILLNESS: [name] "T" presented with pain, erythema, warmth, and skin thickening to the left breast. She was treated with 10 days of Augmentin, which overall improved her symptoms. However, she had a persistent  lump in the upper-outer left breast. She underwent bilateral diagnostic mammography with tomography and bilateral breast ultrasonography at [facility] on 01/30/2021 showing: breast density category C; bilateral fibrocystic changes; 1.4 cm irregular mass in right breast at 3 o'clock; palpable, indeterminate 4.1 cm left breast mass at 2 o'clock; three abnormal nodes in left axilla; right axilla not imaged.  Accordingly on 01/30/2021 she proceeded to biopsy of the right breast area in question. The pathology from this procedure ([id number]) showed: predominantly fatty tissue with focal benign breast tissue.  She also underwent biopsy of the left breast area in question on 02/07/2021. The pathology from this procedure ([id number]) showed: invasive ductal carcinoma, grade 3. Prognostic indicators significant for: estrogen receptor, 70% positive with weak staining intensity and progesterone receptor, 0% negative. Proliferation  marker Ki67 at 40%. HER2 equivocal by immunohistochemistry (2+), but negative by fluorescent in situ hybridization with a signals ratio 1.49 and number per cell 3.50.  She also underwent biopsy of a left axillary node yesterday, 02/12/2021. The pathology ([id number]) confirmed metastatic carcinoma involving nodal tissue.  Cancer Staging Malignant neoplasm of upper-outer quadrant of left breast in female, estrogen receptor positive ([place]) Staging form: Breast, AJCC 8th Edition - Clinical stage from 02/13/2021: Stage IIIA (cT2, cN1, cM0, G3, ER+, PR-, HER2-) - Signed by [name], MD on 02/13/2021 Stage prefix: Initial diagnosis Histologic grading system: 3 grade system Laterality: Left Staged by: Pathologist and managing physician Stage used in treatment planning: Yes National guidelines used in treatment planning: Yes Type of national guideline used in treatment planning: NCCN  The patient's subsequent history is as detailed below.   INTERVAL HISTORY: Zakariah "T" is here today for f/u of her breast cancer to begin neoadjuvant chemotherapy with weekly Paclitaxel. She started this last week, and is here for week #4.  She is tolerating it moderately well.  Her main concern today is feeling overwhelmed with everything going on, including her treatment and going to nursing school.  She also notes some burning in her stomach after eating and is taking pepcid BID.    T notes that her breast cancer continues to shrink.  She is very happy with how it is responding to the chemotherapy.  REVIEW OF SYSTEMS: Review of Systems  Constitutional:  Positive for fatigue. Negative for appetite change, chills, fever and unexpected weight change.  HENT:   Negative for hearing loss, lump/mass, mouth sores and trouble swallowing.   Eyes:  Negative for eye problems and icterus.  Respiratory:  Negative for chest tightness, cough and shortness of breath.   Cardiovascular:  Negative for chest pain, leg  swelling and  palpitations.  Gastrointestinal:  Negative for abdominal distention, abdominal pain, constipation, diarrhea, nausea and vomiting.  Endocrine: Negative for hot flashes.  Genitourinary:  Negative for difficulty urinating.   Musculoskeletal:  Negative for arthralgias.  Skin:  Negative for itching and rash.  Neurological:  Negative for dizziness, extremity weakness, headaches and numbness.  Hematological:  Negative for adenopathy. Does not bruise/bleed easily.  Psychiatric/Behavioral:  Negative for depression and suicidal ideas. The patient is nervous/anxious.      COVID 19 VACCINATION STATUS:    PAST MEDICAL HISTORY: Past Medical History:  Diagnosis Date   Allergy    seasonal    Anemia    Complication of anesthesia    bp dropped with 1st ovary surgery, no problems since with surgery   Dyspnea    with heavy exertion   GERD (gastroesophageal reflux disease)    History of gestational diabetes 2005   Hyperemesis gravidarum    with all pregnancies   IBS (irritable bowel syndrome)    left breast cancer     PAST SURGICAL HISTORY: Past Surgical History:  Procedure Laterality Date   BILATERAL SALPINGECTOMY Bilateral 2014   emergency   LEFT OOPHORECTOMY Left 2006   PORTACATH PLACEMENT Right 02/28/2021   Procedure: INSERTION PORT-A-CATH;  Surgeon: Coralie Keens, MD;  Location: [facility];  Service: General;  Laterality: Right;    FAMILY HISTORY: Family History  Problem Relation Age of Onset   Diabetes Mother    Hypertension Mother    Diabetes Father    Heart disease Father        4V CABG 03/2016, age 73   Hyperlipidemia Father    Hypertension Father    Diabetes Sister    Hypertension Sister    Breast cancer Maternal Aunt        dx. 93s   Hypertension Maternal Grandmother    Colon cancer Maternal Grandmother        dx. >50   Hypertension Maternal Grandfather    Hypertension Paternal Grandmother    Hypertension Paternal Grandfather     Lung cancer Paternal Grandfather    Her parents are both living-- her mother at age 2 and her father at age 52 as of 01/2021. T has one brother and two sisters. She reports breast cancer in a maternal aunt in her 17's, lung cancer in her paternal grandfather (tobacco user), and colon cancer in her maternal grandmother and a maternal great-aunt (mother's aunt). Of note, one of her sisters underwent genetic testing for keloids, and results were negative.   GYNECOLOGIC HISTORY:  Patient's last menstrual period was 02/27/2021 (exact date). Menarche: 45 years old Age at first live birth: 45 years old [place] P 3 LMP 01/30/2021, irregular and heavy Contraceptive: never used (not currently active) HRT n/a  Hysterectomy? no BSO? 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 She is currently attending classes on Mon, Wed, and Thurs. She is single. She lives at home with son Otho Ket, age 36. Son Michaela Corner, age 25, is a Training and development officer in [place], [place]. Son Royal Ree Edman., age 68, is a Herbalist in [place], [place]. T has two grandchildren, a girl age 47 and a boy age 75, living in [place], [place]: not in place; will likely name her mother, Justis Dupas, as her HCPOA. She can be reached at [phone]   HEALTH MAINTENANCE: Social History   Tobacco Use   Smoking  status: Never   Smokeless tobacco: Never  Vaping Use   Vaping Use: Never used  Substance Use Topics   Alcohol use: Yes    Alcohol/week: 5.0 standard drinks    Types: 1 Glasses of wine, 1 Cans of beer, 1 Shots of liquor, 2 Standard drinks or equivalent per week    Comment: 3-4 drinks per day   Drug use: No     Colonoscopy: yes, performed with [place], [place] [facility]  PAP: 06/2020  Bone density: never done (age)   Allergies  Allergen Reactions   Latex Rash    Current Outpatient Medications  Medication Sig Dispense Refill   pantoprazole  (PROTONIX) 40 MG tablet Take 1 tablet (40 mg total) by mouth daily. 90 tablet 1   venlafaxine XR (EFFEXOR-XR) 37.5 MG 24 hr capsule Take 1 capsule (37.5 mg total) by mouth daily with breakfast. Increase to 2 tab if tolerated after taking for 2 weeks. 30 capsule 1   acetaminophen (TYLENOL) 325 MG tablet Take 650 mg by mouth every 6 (six) hours as needed.     dicyclomine (BENTYL) 10 MG capsule Take 10 mg by mouth as needed.     famotidine (PEPCID) 20 MG tablet Take 1 tablet (20 mg total) by mouth 2 (two) times daily. 60 tablet 5   gabapentin (NEURONTIN) 300 MG capsule Take 1 capsule (300 mg total) by mouth 3 (three) times daily. 90 capsule 1   ibuprofen (ADVIL) 800 MG tablet Take 800 mg by mouth.     lidocaine-prilocaine (EMLA) cream Apply to affected area once 30 g 3   LINACLOTIDE PO Take 72 mcg by mouth daily. linzess     Melatonin 3 MG TABS Take 1 tablet (3 mg total) by mouth at bedtime. (Patient taking differently: Take 3 mg by mouth at bedtime as needed.) 60 tablet 0   ondansetron (ZOFRAN) 8 MG tablet Take 1 tablet (8 mg total) by mouth 2 (two) times daily as needed (Nausea or vomiting). 30 tablet 1   oxyCODONE (OXY IR/ROXICODONE) 5 MG immediate release tablet Take 0.5-1 tablets (2.5-5 mg total) by mouth every 4 (four) hours as needed for severe pain. 30 tablet 0   prochlorperazine (COMPAZINE) 10 MG tablet Take 1 tablet (10 mg total) by mouth every 6 (six) hours as needed (Nausea or vomiting). 30 tablet 1   No current facility-administered medications for this visit.   Facility-Administered Medications Ordered in Other Visits  Medication Dose Route Frequency Provider Last Rate Last Admin   oxyCODONE (Oxy IR/ROXICODONE) 5 MG immediate release tablet             OBJECTIVE:   Vitals:   03/21/21 1107  BP: 104/83  Pulse: (!) 104  Resp: 17  Temp: 97.7 F (36.5 C)  SpO2: 100%     Body mass index is 26.15 kg/m.   Wt Readings from Last 3 Encounters:  03/21/21 187 lb 8 oz (85 kg)   03/07/21 192 lb 14.4 oz (87.5 kg)  03/01/21 195 lb 4.8 oz (88.6 kg)      ECOG FS:1 - Symptomatic but completely ambulatory GENERAL: Patient is a well appearing female in no acute distress HEENT:  Sclerae anicteric.  Oropharynx clear and moist. No ulcerations or evidence of oropharyngeal candidiasis. Neck is supple.  NODES:  No cervical, supraclavicular, or axillary lymphadenopathy palpated.  BREAST EXAM:  left breast with large mass has shrunk to about one half its original size LUNGS:  Clear to auscultation bilaterally.  No wheezes or rhonchi. HEART:  Regular rate and rhythm. No murmur appreciated. ABDOMEN:  Soft, nontender.  Positive, normoactive bowel sounds. No organomegaly palpated. MSK:  No focal spinal tenderness to palpation. Full range of motion bilaterally in the upper extremities. EXTREMITIES:  No peripheral edema.   SKIN:  Clear with no obvious rashes or skin changes. No nail dyscrasia. NEURO:  Nonfocal. Well oriented.  Appropriate affect.    LAB RESULTS:  CMP     Component Value Date/Time   NA 137 03/14/2021 0854   NA 138 08/20/2016 1712   K 3.8 03/14/2021 0854   CL 106 03/14/2021 0854   CO2 23 03/14/2021 0854   GLUCOSE 100 (H) 03/14/2021 0854   BUN 11 03/14/2021 0854   BUN 11 08/20/2016 1712   CREATININE 0.92 03/14/2021 0854   CALCIUM 8.7 (L) 03/14/2021 0854   PROT 6.9 03/14/2021 0854   PROT 6.9 08/20/2016 1712   ALBUMIN 3.6 03/14/2021 0854   ALBUMIN 4.0 08/20/2016 1712   AST 17 03/14/2021 0854   ALT 16 03/14/2021 0854   ALKPHOS 96 03/14/2021 0854   BILITOT 0.5 03/14/2021 0854   GFRNONAA >60 03/14/2021 0854   GFRAA 88 08/20/2016 1712    No results found for: TOTALPROTELP, ALBUMINELP, A1GS, A2GS, BETS, BETA2SER, GAMS, MSPIKE, SPEI  Lab Results  Component Value Date   WBC 5.6 03/21/2021   NEUTROABS 2.8 03/21/2021   HGB 8.7 (L) 03/21/2021   HCT 28.8 (L) 03/21/2021   MCV 73.8 (L) 03/21/2021   PLT 470 (H) 03/21/2021     Lab Results  Component  Value Date   IRON 16 (L) 06/30/2019   TIBC 385 06/30/2019   IRONPCTSAT 4 (LL) 06/30/2019   (Iron and TIBC)  Lab Results  Component Value Date   FERRITIN 18 06/30/2019    Urinalysis    Component Value Date/Time   LABSPEC >=1.030 04/22/2019 1307   PHURINE 5.5 04/22/2019 1307   GLUCOSEU NEGATIVE 04/22/2019 1307   HGBUR NEGATIVE 04/22/2019 1307   BILIRUBINUR negative 06/30/2019 1343   KETONESUR negative 06/30/2019 1343   KETONESUR NEGATIVE 04/22/2019 1307   PROTEINUR negative 06/30/2019 1343   PROTEINUR NEGATIVE 04/22/2019 1307   UROBILINOGEN 0.2 06/30/2019 1343   UROBILINOGEN 0.2 04/22/2019 1307   NITRITE Negative 06/30/2019 1343   NITRITE NEGATIVE 04/22/2019 1307   LEUKOCYTESUR Trace (A) 06/30/2019 1343   LEUKOCYTESUR NEGATIVE 04/22/2019 1307     STUDIES:    ELIGIBLE FOR AVAILABLE RESEARCH PROTOCOL: no  ASSESSMENT: 45 y.o. [place], [place] woman status post left breast upper outer quadrant biopsy 02/07/2021 for a clinical T2 Ni, stage IIIA invasive ductal carcinoma, grade 3, estrogen receptor weakly positive, progesterone receptor and HER2 negative, with an MIB-1 of 40%.  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 Specifically, this variant is p.R247*. (A) Genetic testing identified a variant of uncertain significance (VUS) in the ATM gene called p.U1324M. (B) Colon Cancer Screening recommendations on MUTYH single pathogenic variant: If an individual has a first-degree relative with colorectal cancer, screening should begin at age 44 or 8 years prior to the relative's age at diagnosis, whichever comes first and repeat colonoscopies every 5 years.  Grandmother, and aunt with FH of colon cancer.  (2) neoadjuvant chemotherapy will consist of paclitaxel weekly x12 followed by dose dense doxorubicin and cyclophosphamide x4  (3) definitive surgery to  follow  (4) adjuvant radiation as appropriate  (5) antiestrogens.  PLAN:  Mayah continues on  neoadjuvant chemotherapy with weekly paclitaxel.  She will proceed with her treatment today.  Her neutrophil count is 1.1 today.  I reviewed this with her.  So long as it is above 1 we can proceed with her therapy.  She is not experiencing any peripheral neuropathy.  We are monitoring her closely for this.  She is experiencing increased anxiety and difficulty concentrating.  I have sent in some Effexor for her to start we reviewed the risks and benefits in detail.  She has information about this medication in her after visit summary.  For her indigestion and reflux ordered Protonix 40 mg for her to take first thing in the morning on an empty stomach.  We reviewed this in detail.  Delay she will return in 1 week for labs and her chemotherapy.  We will see her in 2 weeks when she returns for her labs and chemotherapy.  She knows to call for any questions or concerns that may arise between now and her next appointment.  We are always happy to see her sooner if needed.  Total encounter time 30 minutes.* in face-to-face visit time, chart review, lab review, order entry, and documentation of the encounter.  Wilber Bihari, NP 03/21/21 11:28 AM Medical Oncology and Hematology [facility], [place] [address] Tel. [phone]    Fax. [phone]  *Total Encounter Time as defined by the Centers for Medicare and Medicaid Services includes, in addition to the face-to-face time of a patient visit (documented in the note above) non-face-to-face time: obtaining and reviewing outside history, ordering and reviewing medications, tests or procedures, care coordination (communications with other health care professionals or caregivers) and documentation in the medical record.

## 2021-03-28 ENCOUNTER — Inpatient Hospital Stay: Payer: BC Managed Care – PPO

## 2021-03-28 ENCOUNTER — Other Ambulatory Visit: Payer: Self-pay

## 2021-03-28 VITALS — BP 126/85 | HR 80 | Temp 98.7°F | Resp 16 | Wt 190.5 lb

## 2021-03-28 DIAGNOSIS — C50412 Malignant neoplasm of upper-outer quadrant of left female breast: Secondary | ICD-10-CM | POA: Diagnosis not present

## 2021-03-28 DIAGNOSIS — Z170 Estrogen receptor positive status [ER+]: Secondary | ICD-10-CM

## 2021-03-28 DIAGNOSIS — Z95828 Presence of other vascular implants and grafts: Secondary | ICD-10-CM

## 2021-03-28 LAB — CMP (CANCER CENTER ONLY)
ALT: 14 U/L (ref 0–44)
AST: 16 U/L (ref 15–41)
Albumin: 3.6 g/dL (ref 3.5–5.0)
Alkaline Phosphatase: 95 U/L (ref 38–126)
Anion gap: 8 (ref 5–15)
BUN: 16 mg/dL (ref 6–20)
CO2: 22 mmol/L (ref 22–32)
Calcium: 8.5 mg/dL — ABNORMAL LOW (ref 8.9–10.3)
Chloride: 107 mmol/L (ref 98–111)
Creatinine: 0.86 mg/dL (ref 0.44–1.00)
GFR, Estimated: >60 mL/min (ref >60)
Glucose, Bld: 98 mg/dL (ref 70–99)
Potassium: 3.9 mmol/L (ref 3.5–5.1)
Sodium: 137 mmol/L (ref 135–145)
Total Bilirubin: 0.3 mg/dL (ref 0.3–1.2)
Total Protein: 6.8 g/dL (ref 6.5–8.1)

## 2021-03-28 LAB — CBC WITH DIFFERENTIAL (CANCER CENTER ONLY)
Abs Immature Granulocytes: 0.02 10*3/uL (ref 0.00–0.07)
Basophils Absolute: 0.1 10*3/uL (ref 0.0–0.1)
Basophils Relative: 1 %
Eosinophils Absolute: 0.2 10*3/uL (ref 0.0–0.5)
Eosinophils Relative: 3 %
HCT: 26.9 % — ABNORMAL LOW (ref 36.0–46.0)
Hemoglobin: 8.3 g/dL — ABNORMAL LOW (ref 12.0–15.0)
Immature Granulocytes: 0 %
Lymphocytes Relative: 59 %
Lymphs Abs: 2.8 10*3/uL (ref 0.7–4.0)
MCH: 22.8 pg — ABNORMAL LOW (ref 26.0–34.0)
MCHC: 30.9 g/dL (ref 30.0–36.0)
MCV: 73.9 fL — ABNORMAL LOW (ref 80.0–100.0)
Monocytes Absolute: 0.4 10*3/uL (ref 0.1–1.0)
Monocytes Relative: 8 %
Neutro Abs: 1.4 10*3/uL — ABNORMAL LOW (ref 1.7–7.7)
Neutrophils Relative %: 29 %
Platelet Count: 375 10*3/uL (ref 150–400)
RBC: 3.64 MIL/uL — ABNORMAL LOW (ref 3.87–5.11)
RDW: 21.6 % — ABNORMAL HIGH (ref 11.5–15.5)
WBC Count: 4.8 10*3/uL (ref 4.0–10.5)
nRBC: 0 % (ref 0.0–0.2)

## 2021-03-28 MED ORDER — DIPHENHYDRAMINE HCL 50 MG/ML IJ SOLN
25.0000 mg | Freq: Once | INTRAMUSCULAR | Status: AC
  Administered 2021-03-28: 25 mg via INTRAVENOUS
  Filled 2021-03-28: qty 1

## 2021-03-28 MED ORDER — FAMOTIDINE 20 MG IN NS 100 ML IVPB
20.0000 mg | Freq: Once | INTRAVENOUS | Status: AC
  Administered 2021-03-28: 20 mg via INTRAVENOUS
  Filled 2021-03-28: qty 100

## 2021-03-28 MED ORDER — SODIUM CHLORIDE 0.9 % IV SOLN
80.0000 mg/m2 | Freq: Once | INTRAVENOUS | Status: AC
  Administered 2021-03-28: 168 mg via INTRAVENOUS
  Filled 2021-03-28: qty 28

## 2021-03-28 MED ORDER — SODIUM CHLORIDE 0.9% FLUSH
10.0000 mL | Freq: Once | INTRAVENOUS | Status: AC
  Administered 2021-03-28: 10 mL

## 2021-03-28 MED ORDER — HEPARIN SOD (PORK) LOCK FLUSH 100 UNIT/ML IV SOLN
500.0000 [IU] | Freq: Once | INTRAVENOUS | Status: AC | PRN
  Administered 2021-03-28: 500 [IU]

## 2021-03-28 MED ORDER — SODIUM CHLORIDE 0.9% FLUSH
10.0000 mL | INTRAVENOUS | Status: DC | PRN
  Administered 2021-03-28: 10 mL

## 2021-03-28 MED ORDER — SODIUM CHLORIDE 0.9 % IV SOLN: Freq: Once | INTRAVENOUS | Status: AC

## 2021-03-28 MED ORDER — SODIUM CHLORIDE 0.9 % IV SOLN
10.0000 mg | Freq: Once | INTRAVENOUS | Status: AC
  Administered 2021-03-28: 10 mg via INTRAVENOUS
  Filled 2021-03-28: qty 10

## 2021-03-28 NOTE — Patient Instructions (Signed)
[facility]   Discharge Instructions: Thank you for choosing [facility] to provide your oncology and hematology care.   If you have a lab appointment with the Cancer Center, please go directly to the Cancer Center and check in at the registration area.   Wear comfortable clothing and clothing appropriate for easy access to any Portacath or PICC line.   We strive to give you quality time with your provider. You may need to reschedule your appointment if you arrive late (15 or more minutes).  Arriving late affects you and other patients whose appointments are after yours.  Also, if you miss three or more appointments without notifying the office, you may be dismissed from the clinic at the provider's discretion.      For prescription refill requests, have your pharmacy contact our office and allow 72 hours for refills to be completed.    Today you received the following chemotherapy and/or immunotherapy agents: paclitaxel.      To help prevent nausea and vomiting after your treatment, we encourage you to take your nausea medication as directed.  BELOW ARE SYMPTOMS THAT SHOULD BE REPORTED IMMEDIATELY: *FEVER GREATER THAN 100.4 F (38 C) OR HIGHER *CHILLS OR SWEATING *NAUSEA AND VOMITING THAT IS NOT CONTROLLED WITH YOUR NAUSEA MEDICATION *UNUSUAL SHORTNESS OF BREATH *UNUSUAL BRUISING OR BLEEDING *URINARY PROBLEMS (pain or burning when urinating, or frequent urination) *BOWEL PROBLEMS (unusual diarrhea, constipation, pain near the anus) TENDERNESS IN MOUTH AND THROAT WITH OR WITHOUT PRESENCE OF ULCERS (sore throat, sores in mouth, or a toothache) UNUSUAL RASH, SWELLING OR PAIN  UNUSUAL VAGINAL DISCHARGE OR ITCHING   Items with * indicate a potential emergency and should be followed up as soon as possible or go to the Emergency Department if any problems should occur.  Please show the CHEMOTHERAPY ALERT CARD or IMMUNOTHERAPY ALERT CARD at check-in  to the Emergency Department and triage nurse.  Should you have questions after your visit or need to cancel or reschedule your appointment, please contact [facility]  Dept: [phone]  and follow the prompts.  Office hours are 8:00 a.m. to 4:30 p.m. Monday - Friday. Please note that voicemails left after 4:00 p.m. may not be returned until the following business day.  We are closed weekends and major holidays. You have access to a nurse at all times for urgent questions. Please call the main number to the clinic Dept: [phone] and follow the prompts.   For any non-urgent questions, you may also contact your provider using MyChart. We now offer e-Visits for anyone 18 and older to request care online for non-urgent symptoms. For details visit [email or web address].   Also download the MyChart app! Go to the app store, search "MyChart", open the app, select , and log in with your MyChart username and password.  Due to Covid, a mask is required upon entering the hospital/clinic. If you do not have a mask, one will be given to you upon arrival. For doctor visits, patients may have 1 support person aged 18 or older with them. For treatment visits, patients cannot have anyone with them due to current Covid guidelines and our immunocompromised population.   

## 2021-03-28 NOTE — Progress Notes (Signed)
Per MD note 11/3 ok to treat as long as [place] >1

## 2021-04-02 ENCOUNTER — Other Ambulatory Visit: Payer: Self-pay | Admitting: Adult Health

## 2021-04-02 DIAGNOSIS — C50412 Malignant neoplasm of upper-outer quadrant of left female breast: Secondary | ICD-10-CM

## 2021-04-02 DIAGNOSIS — Z170 Estrogen receptor positive status [ER+]: Secondary | ICD-10-CM

## 2021-04-03 ENCOUNTER — Encounter: Payer: Self-pay | Admitting: Oncology

## 2021-04-03 ENCOUNTER — Encounter: Payer: Self-pay | Admitting: Adult Health

## 2021-04-03 NOTE — Progress Notes (Signed)
[facility]  Telephone:([phone] Fax:(336) [phone]     ID: NORVELLA LOSCALZO DOB: 06/05/1975  MR#: [id number]  ZCH#:[id number]  Patient Care Team: [name], NP as PCP - General (Adult Health Nurse Practitioner) Coralie Keens, MD as Consulting Physician (General Surgery) Magrinat, Virgie Dad, MD as Consulting Physician (Oncology) Eppie Gibson, MD as Attending Physician (Radiation Oncology) Rockwell Germany, RN as Oncology Nurse Navigator Mauro Kaufmann, RN as Oncology Nurse Navigator Lexander Tremblay, Charlestine Massed, NP as Nurse Practitioner (Hematology and Oncology) Scot Dock, NP OTHER MD:  CHIEF COMPLAINT: Estrogen receptor positive breast cancer  CURRENT TREATMENT: Neoadjuvant chemotherapy   HISTORY OF CURRENT ILLNESS: [name] "T" presented with pain, erythema, warmth, and skin thickening to the left breast. She was treated with 10 days of Augmentin, which overall improved her symptoms. However, she had a persistent  lump in the upper-outer left breast. She underwent bilateral diagnostic mammography with tomography and bilateral breast ultrasonography at [facility] on 01/30/2021 showing: breast density category C; bilateral fibrocystic changes; 1.4 cm irregular mass in right breast at 3 o'clock; palpable, indeterminate 4.1 cm left breast mass at 2 o'clock; three abnormal nodes in left axilla; right axilla not imaged.  Accordingly on 01/30/2021 she proceeded to biopsy of the right breast area in question. The pathology from this procedure ([id number]) showed: predominantly fatty tissue with focal benign breast tissue.  She also underwent biopsy of the left breast area in question on 02/07/2021. The pathology from this procedure ([id number]) showed: invasive ductal carcinoma, grade 3. Prognostic indicators significant for: estrogen receptor, 70% positive with weak staining intensity and progesterone receptor, 0% negative. Proliferation  marker Ki67 at 40%. HER2 equivocal by immunohistochemistry (2+), but negative by fluorescent in situ hybridization with a signals ratio 1.49 and number per cell 3.50.  She also underwent biopsy of a left axillary node yesterday, 02/12/2021. The pathology ([id number]) confirmed metastatic carcinoma involving nodal tissue.   Cancer Staging  Malignant neoplasm of upper-outer quadrant of left breast in female, estrogen receptor positive ([place]) Staging form: Breast, AJCC 8th Edition - Clinical stage from 02/13/2021: Stage IIIA (cT2, cN1, cM0, G3, ER+, PR-, HER2-) - Signed by [name], MD on 02/13/2021 Stage prefix: Initial diagnosis Histologic grading system: 3 grade system Laterality: Left Staged by: Pathologist and managing physician Stage used in treatment planning: Yes National guidelines used in treatment planning: Yes Type of national guideline used in treatment planning: NCCN  The patient's subsequent history is as detailed below.   INTERVAL HISTORY: Anyra "T" is here today for f/u of her breast cancer to begin neoadjuvant chemotherapy with weekly Paclitaxel. She is here for week 47 of treatment. Seri is tolerating treatment moderately well.  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  We started her on Effexor a couple of weeks ago and she is tolerating this well.  She notes her anxiety is much improved.  She had some peripheral neuropathy 3 days ago that she noted as a numbness in her toes.  It was brief and has since resolved.    Maurina is requesting a refill of 47 of her Oxyocodone today as she takes it when she gets sternal or hip pain that she has experienced intermittently since starting the chemotherapy.    REVIEW OF SYSTEMS: Review of Systems  Constitutional:  Positive for fatigue. Negative for appetite change, chills, fever and unexpected weight change.  HENT:   Negative for hearing loss, lump/mass,  mouth sores and trouble swallowing.   Eyes:  Negative for eye problems and icterus.  Respiratory:  Negative for chest tightness, cough and shortness of breath.   Cardiovascular:  Negative for chest pain, leg swelling and palpitations.  Gastrointestinal:  Negative for abdominal distention, abdominal pain, constipation, diarrhea, nausea and vomiting.  Endocrine: Negative for hot flashes.  Genitourinary:  Negative for difficulty urinating.   Musculoskeletal:  Positive for back pain. Negative for arthralgias.  Skin:  Negative for itching and rash.  Neurological:  Negative for dizziness, extremity weakness, headaches and numbness.  Hematological:  Negative for adenopathy. Does not bruise/bleed easily.  Psychiatric/Behavioral:  Positive for decreased concentration. Negative for depression and suicidal ideas. The patient is not nervous/anxious.      COVID 19 VACCINATION STATUS:    PAST MEDICAL HISTORY: Past Medical History:  Diagnosis Date   Allergy    seasonal    Anemia    Complication of anesthesia    bp dropped with 1st ovary surgery, no problems since with surgery   Dyspnea    with heavy exertion   GERD (gastroesophageal reflux disease)    History of gestational diabetes 2005   Hyperemesis gravidarum    with all pregnancies   IBS (irritable bowel syndrome)    left breast cancer     PAST SURGICAL HISTORY: Past Surgical History:  Procedure Laterality Date   BILATERAL SALPINGECTOMY Bilateral 2014   emergency   LEFT OOPHORECTOMY Left 2006   PORTACATH PLACEMENT Right 02/28/2021   Procedure: INSERTION PORT-A-CATH;  Surgeon: Coralie Keens, MD;  Location: [facility];  Service: General;  Laterality: Right;    FAMILY HISTORY: Family History  Problem Relation Age of Onset   Diabetes Mother    Hypertension Mother    Diabetes Father    Heart disease Father        4V CABG 03/2016, age 72   Hyperlipidemia Father    Hypertension Father    Diabetes Sister     Hypertension Sister    Breast cancer Maternal Aunt        dx. 35s   Hypertension Maternal Grandmother    Colon cancer Maternal Grandmother        dx. >50   Hypertension Maternal Grandfather    Hypertension Paternal Grandmother    Hypertension Paternal Grandfather    Lung cancer Paternal Grandfather    Her parents are both living-- her mother at age 50 and her father at age 40 as of 01/2021. T has one brother and two sisters. She reports breast cancer in a maternal aunt in her 31's, lung cancer in her paternal grandfather (tobacco user), and colon cancer in her maternal grandmother and a maternal great-aunt (mother's aunt). Of note, one of her sisters underwent genetic testing for keloids, and results were negative.   GYNECOLOGIC HISTORY:  No LMP recorded. (Menstrual status: Irregular Periods). Menarche: 45 years old Age at first live birth: 45 years old [place] P 3 LMP 01/30/2021, irregular and heavy Contraceptive: never used (not currently active) HRT n/a  Hysterectomy? no BSO? 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 She is currently attending classes on Mon, Wed, and Thurs. She is single. She lives at home with son Otho Ket, age 36. Son Michaela Corner, age 99, is a Training and development officer in [place], [place]. Son Royal Ree Edman., age 46, is a Herbalist in [place], [place]. T has two grandchildren, a girl age 50 and  a boy age 31, living in [place], [place]: not in place; will likely name her mother, Yasmeen Manka, as her HCPOA. She can be reached at [place]: Social History   Tobacco Use   Smoking status: Never   Smokeless tobacco: Never  Vaping Use   Vaping Use: Never used  Substance Use Topics   Alcohol use: Yes    Alcohol/week: 5.0 standard drinks    Types: 1 Glasses of wine, 1 Cans of beer, 1 Shots of liquor, 2 Standard drinks or equivalent per week    Comment: 3-4 drinks  per day   Drug use: No     Colonoscopy: yes, performed with [facility] [place] [facility]  PAP: 06/2020  Bone density: never done (age)   Allergies  Allergen Reactions   Latex Rash    Current Outpatient Medications  Medication Sig Dispense Refill   acetaminophen (TYLENOL) 325 MG tablet Take 650 mg by mouth every 6 (six) hours as needed.     dicyclomine (BENTYL) 10 MG capsule Take 10 mg by mouth as needed.     famotidine (PEPCID) 20 MG tablet Take 1 tablet (20 mg total) by mouth 2 (two) times daily. 60 tablet 5   gabapentin (NEURONTIN) 300 MG capsule Take 1 capsule (300 mg total) by mouth 3 (three) times daily. 90 capsule 1   ibuprofen (ADVIL) 800 MG tablet Take 800 mg by mouth.     lidocaine-prilocaine (EMLA) cream Apply to affected area once 30 g 3   LINACLOTIDE PO Take 72 mcg by mouth daily. linzess     Melatonin 3 MG TABS Take 1 tablet (3 mg total) by mouth at bedtime. (Patient taking differently: Take 3 mg by mouth at bedtime as needed.) 60 tablet 0   ondansetron (ZOFRAN) 8 MG tablet Take 1 tablet (8 mg total) by mouth 2 (two) times daily as needed (Nausea or vomiting). 30 tablet 1   oxyCODONE (OXY IR/ROXICODONE) 5 MG immediate release tablet Take 0.5-1 tablets (2.5-5 mg total) by mouth every 4 (four) hours as needed for severe pain. 30 tablet 0   pantoprazole (PROTONIX) 40 MG tablet Take 1 tablet (40 mg total) by mouth daily. 90 tablet 1   prochlorperazine (COMPAZINE) 10 MG tablet Take 1 tablet (10 mg total) by mouth every 6 (six) hours as needed (Nausea or vomiting). 30 tablet 1   venlafaxine XR (EFFEXOR-XR) 37.5 MG 24 hr capsule Take 1 capsule (37.5 mg total) by mouth daily with breakfast. Increase to 2 tab if tolerated after taking for 2 weeks. 30 capsule 1   No current facility-administered medications for this visit.    OBJECTIVE:   Vitals:   04/04/21 0828  BP: (!) 142/86  Pulse: 91  Resp: 16  Temp: (!) 97.5 F (36.4 C)  SpO2: 100%     Body mass index is 26.18 kg/m.   Wt  Readings from Last 3 Encounters:  04/04/21 187 lb 11.2 oz (85.1 kg)  03/28/21 190 lb 8 oz (86.4 kg)  03/21/21 187 lb 8 oz (85 kg)      ECOG FS:1 - Symptomatic but completely ambulatory GENERAL: Patient is a well appearing female in no acute distress HEENT:  Sclerae anicteric.  Oropharynx clear and moist. No ulcerations or evidence of oropharyngeal candidiasis. Neck is supple.  NODES:  No cervical, supraclavicular, or axillary lymphadenopathy palpated.  BREAST EXAM:  left breast mass continues to shrink LUNGS:  Clear to auscultation bilaterally.  No wheezes or  rhonchi. HEART:  Regular rate and rhythm. No murmur appreciated. ABDOMEN:  Soft, nontender.  Positive, normoactive bowel sounds. No organomegaly palpated. MSK:  No focal spinal tenderness to palpation. Full range of motion bilaterally in the upper extremities. EXTREMITIES:  No peripheral edema.   SKIN:  Clear with no obvious rashes or skin changes. No nail dyscrasia. NEURO:  Nonfocal. Well oriented.  Appropriate affect.    LAB RESULTS:  CMP     Component Value Date/Time   NA 137 03/28/2021 0845   NA 138 08/20/2016 1712   K 3.9 03/28/2021 0845   CL 107 03/28/2021 0845   CO2 22 03/28/2021 0845   GLUCOSE 98 03/28/2021 0845   BUN 16 03/28/2021 0845   BUN 11 08/20/2016 1712   CREATININE 0.86 03/28/2021 0845   CALCIUM 8.5 (L) 03/28/2021 0845   PROT 6.8 03/28/2021 0845   PROT 6.9 08/20/2016 1712   ALBUMIN 3.6 03/28/2021 0845   ALBUMIN 4.0 08/20/2016 1712   AST 16 03/28/2021 0845   ALT 14 03/28/2021 0845   ALKPHOS 95 03/28/2021 0845   BILITOT 0.3 03/28/2021 0845   GFRNONAA >60 03/28/2021 0845   GFRAA 88 08/20/2016 1712    No results found for: Ronnald Ramp, A1GS, A2GS, BETS, BETA2SER, GAMS, MSPIKE, SPEI  Lab Results  Component Value Date   WBC 5.8 04/04/2021   NEUTROABS 2.6 04/04/2021   HGB 9.2 (L) 04/04/2021   HCT 29.8 (L) 04/04/2021   MCV 74.5 (L) 04/04/2021   PLT 395 04/04/2021     Lab Results   Component Value Date   IRON 16 (L) 06/30/2019   TIBC 385 06/30/2019   IRONPCTSAT 4 (LL) 06/30/2019   (Iron and TIBC)  Lab Results  Component Value Date   FERRITIN 18 06/30/2019    Urinalysis    Component Value Date/Time   LABSPEC >=1.030 04/22/2019 1307   PHURINE 5.5 04/22/2019 1307   GLUCOSEU NEGATIVE 04/22/2019 1307   HGBUR NEGATIVE 04/22/2019 1307   BILIRUBINUR negative 06/30/2019 1343   KETONESUR negative 06/30/2019 [place] 04/22/2019 1307   PROTEINUR negative 06/30/2019 1343   PROTEINUR NEGATIVE 04/22/2019 1307   UROBILINOGEN 0.2 06/30/2019 1343   UROBILINOGEN 0.2 04/22/2019 1307   NITRITE Negative 06/30/2019 1343   NITRITE NEGATIVE 04/22/2019 1307   LEUKOCYTESUR Trace (A) 06/30/2019 1343   LEUKOCYTESUR NEGATIVE 04/22/2019 1307     STUDIES:    ELIGIBLE FOR AVAILABLE RESEARCH PROTOCOL: no  ASSESSMENT: 45 y.o. [place], [place] woman status post left breast upper outer quadrant biopsy 02/07/2021 for a clinical T2 Ni, stage IIIA invasive ductal carcinoma, grade 3, estrogen receptor weakly positive, progesterone receptor and HER2 negative, with an MIB-1 of 40%.  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 Specifically, this variant is p.R247*. (A) Genetic testing identified a variant of uncertain significance (VUS) in the ATM gene called p.[id number]. (B) Colon Cancer Screening recommendations on MUTYH single pathogenic variant: If an individual has a first-degree relative with colorectal cancer, screening should begin at age 39 or 80 years prior to the relative's age at diagnosis, whichever comes first and repeat colonoscopies every 5 years.  Grandmother, and aunt with FH of colon cancer.  (2) neoadjuvant chemotherapy will consist of paclitaxel weekly x12 followed by dose dense doxorubicin and cyclophosphamide x4  (3) definitive  surgery to follow  (4) adjuvant radiation as appropriate  (5) antiestrogens.  PLAN:  Camala is  here for f/u prior to receiving her next cycle of neoadjuvant chemotherapy.  She is tolerating this well.  Her CBC is stable today.  She will proceed with chemotherapy so long as her c-Met is stable.  [place] I discussed her intermittent sternal and hip pain.  I have refilled her oxycodone.  We had a detailed discussion on how to take the medication and that it is too allow her to be more functional throughout the day.  She verbalized understanding and has no questions about this.  We discussed this intermittent peripheral neuropathy.  It has resolved.  Due to this we will proceed with chemotherapy today.  Should neuropathy return and become constant we will need to make adjustments.  Her anxiety is much improved with the Effexor and she will continue this.  We will see Junius Creamer in 1 week for chemo and in 2 weeks for labs follow-up and chemo.  Total encounter time 30 minutes.* in face-to-face visit time, chart review, lab review, order entry, and documentation of the encounter.  Wilber Bihari, NP 04/04/21 8:58 AM Medical Oncology and Hematology [facility] [address] [place], [place] [address] Tel. [phone]    Fax. [phone]  *Total Encounter Time as defined by the Centers for Medicare and Medicaid Services includes, in addition to the face-to-face time of a patient visit (documented in the note above) non-face-to-face time: obtaining and reviewing outside history, ordering and reviewing medications, tests or procedures, care coordination (communications with other health care professionals or caregivers) and documentation in the medical record.

## 2021-04-04 ENCOUNTER — Inpatient Hospital Stay (HOSPITAL_BASED_OUTPATIENT_CLINIC_OR_DEPARTMENT_OTHER): Payer: BC Managed Care – PPO | Admitting: Adult Health

## 2021-04-04 ENCOUNTER — Telehealth: Payer: Self-pay

## 2021-04-04 ENCOUNTER — Inpatient Hospital Stay: Payer: BC Managed Care – PPO

## 2021-04-04 ENCOUNTER — Encounter: Payer: Self-pay | Admitting: Adult Health

## 2021-04-04 ENCOUNTER — Other Ambulatory Visit: Payer: Self-pay

## 2021-04-04 VITALS — BP 142/86 | HR 91 | Temp 97.5°F | Resp 16 | Ht 71.0 in | Wt 187.7 lb

## 2021-04-04 DIAGNOSIS — C50412 Malignant neoplasm of upper-outer quadrant of left female breast: Secondary | ICD-10-CM | POA: Diagnosis not present

## 2021-04-04 DIAGNOSIS — Z95828 Presence of other vascular implants and grafts: Secondary | ICD-10-CM

## 2021-04-04 DIAGNOSIS — Z170 Estrogen receptor positive status [ER+]: Secondary | ICD-10-CM

## 2021-04-04 LAB — CBC WITH DIFFERENTIAL (CANCER CENTER ONLY)
Abs Immature Granulocytes: 0.03 10*3/uL (ref 0.00–0.07)
Basophils Absolute: 0.1 10*3/uL (ref 0.0–0.1)
Basophils Relative: 1 %
Eosinophils Absolute: 0.1 10*3/uL (ref 0.0–0.5)
Eosinophils Relative: 2 %
HCT: 29.8 % — ABNORMAL LOW (ref 36.0–46.0)
Hemoglobin: 9.2 g/dL — ABNORMAL LOW (ref 12.0–15.0)
Immature Granulocytes: 1 %
Lymphocytes Relative: 45 %
Lymphs Abs: 2.6 10*3/uL (ref 0.7–4.0)
MCH: 23 pg — ABNORMAL LOW (ref 26.0–34.0)
MCHC: 30.9 g/dL (ref 30.0–36.0)
MCV: 74.5 fL — ABNORMAL LOW (ref 80.0–100.0)
Monocytes Absolute: 0.3 10*3/uL (ref 0.1–1.0)
Monocytes Relative: 5 %
Neutro Abs: 2.6 10*3/uL (ref 1.7–7.7)
Neutrophils Relative %: 46 %
Platelet Count: 395 10*3/uL (ref 150–400)
RBC: 4 MIL/uL (ref 3.87–5.11)
RDW: 22.1 % — ABNORMAL HIGH (ref 11.5–15.5)
WBC Count: 5.8 10*3/uL (ref 4.0–10.5)
nRBC: 0 % (ref 0.0–0.2)

## 2021-04-04 LAB — CMP (CANCER CENTER ONLY)
ALT: 13 U/L (ref 0–44)
AST: 15 U/L (ref 15–41)
Albumin: 3.7 g/dL (ref 3.5–5.0)
Alkaline Phosphatase: 96 U/L (ref 38–126)
Anion gap: 8 (ref 5–15)
BUN: 13 mg/dL (ref 6–20)
CO2: 23 mmol/L (ref 22–32)
Calcium: 8.9 mg/dL (ref 8.9–10.3)
Chloride: 107 mmol/L (ref 98–111)
Creatinine: 0.93 mg/dL (ref 0.44–1.00)
GFR, Estimated: >60 mL/min (ref >60)
Glucose, Bld: 107 mg/dL — ABNORMAL HIGH (ref 70–99)
Potassium: 4.1 mmol/L (ref 3.5–5.1)
Sodium: 138 mmol/L (ref 135–145)
Total Bilirubin: 0.3 mg/dL (ref 0.3–1.2)
Total Protein: 7.2 g/dL (ref 6.5–8.1)

## 2021-04-04 MED ORDER — OXYCODONE HCL 5 MG PO TABS: 2.5000 mg | ORAL_TABLET | ORAL | 0 refills | Status: DC | PRN

## 2021-04-04 MED ORDER — HEPARIN SOD (PORK) LOCK FLUSH 100 UNIT/ML IV SOLN
500.0000 [IU] | Freq: Once | INTRAVENOUS | Status: AC | PRN
  Administered 2021-04-04: 14:00:00 500 [IU]

## 2021-04-04 MED ORDER — FAMOTIDINE 20 MG IN NS 100 ML IVPB
20.0000 mg | Freq: Once | INTRAVENOUS | Status: AC
  Administered 2021-04-04: 10:00:00 20 mg via INTRAVENOUS
  Filled 2021-04-04: qty 100

## 2021-04-04 MED ORDER — SODIUM CHLORIDE 0.9% FLUSH
10.0000 mL | INTRAVENOUS | Status: DC | PRN
  Administered 2021-04-04: 14:00:00 10 mL

## 2021-04-04 MED ORDER — SODIUM CHLORIDE 0.9 % IV SOLN
10.0000 mg | Freq: Once | INTRAVENOUS | Status: AC
  Administered 2021-04-04: 10:00:00 10 mg via INTRAVENOUS
  Filled 2021-04-04: qty 10

## 2021-04-04 MED ORDER — VENLAFAXINE HCL ER 75 MG PO CP24: 75.0000 mg | ORAL_CAPSULE | Freq: Every day | ORAL | 3 refills | Status: DC

## 2021-04-04 MED ORDER — DIPHENHYDRAMINE HCL 50 MG/ML IJ SOLN
25.0000 mg | Freq: Once | INTRAMUSCULAR | Status: AC
  Administered 2021-04-04: 10:00:00 25 mg via INTRAVENOUS
  Filled 2021-04-04: qty 1

## 2021-04-04 MED ORDER — SODIUM CHLORIDE 0.9 % IV SOLN: Freq: Once | INTRAVENOUS | Status: AC

## 2021-04-04 MED ORDER — SODIUM CHLORIDE 0.9 % IV SOLN
80.0000 mg/m2 | Freq: Once | INTRAVENOUS | Status: AC
  Administered 2021-04-04: 11:00:00 168 mg via INTRAVENOUS
  Filled 2021-04-04: qty 28

## 2021-04-04 MED ORDER — SODIUM CHLORIDE 0.9% FLUSH
10.0000 mL | Freq: Once | INTRAVENOUS | Status: AC
  Administered 2021-04-04: 08:00:00 10 mL

## 2021-04-04 NOTE — Patient Instructions (Signed)
[facility]   Discharge Instructions: Thank you for choosing [facility] to provide your oncology and hematology care.   If you have a lab appointment with the Cancer Center, please go directly to the Cancer Center and check in at the registration area.   Wear comfortable clothing and clothing appropriate for easy access to any Portacath or PICC line.   We strive to give you quality time with your provider. You may need to reschedule your appointment if you arrive late (15 or more minutes).  Arriving late affects you and other patients whose appointments are after yours.  Also, if you miss three or more appointments without notifying the office, you may be dismissed from the clinic at the provider's discretion.      For prescription refill requests, have your pharmacy contact our office and allow 72 hours for refills to be completed.    Today you received the following chemotherapy and/or immunotherapy agents: paclitaxel.      To help prevent nausea and vomiting after your treatment, we encourage you to take your nausea medication as directed.  BELOW ARE SYMPTOMS THAT SHOULD BE REPORTED IMMEDIATELY: *FEVER GREATER THAN 100.4 F (38 C) OR HIGHER *CHILLS OR SWEATING *NAUSEA AND VOMITING THAT IS NOT CONTROLLED WITH YOUR NAUSEA MEDICATION *UNUSUAL SHORTNESS OF BREATH *UNUSUAL BRUISING OR BLEEDING *URINARY PROBLEMS (pain or burning when urinating, or frequent urination) *BOWEL PROBLEMS (unusual diarrhea, constipation, pain near the anus) TENDERNESS IN MOUTH AND THROAT WITH OR WITHOUT PRESENCE OF ULCERS (sore throat, sores in mouth, or a toothache) UNUSUAL RASH, SWELLING OR PAIN  UNUSUAL VAGINAL DISCHARGE OR ITCHING   Items with * indicate a potential emergency and should be followed up as soon as possible or go to the Emergency Department if any problems should occur.  Please show the CHEMOTHERAPY ALERT CARD or IMMUNOTHERAPY ALERT CARD at check-in  to the Emergency Department and triage nurse.  Should you have questions after your visit or need to cancel or reschedule your appointment, please contact [facility]  Dept: [phone]  and follow the prompts.  Office hours are 8:00 a.m. to 4:30 p.m. Monday - Friday. Please note that voicemails left after 4:00 p.m. may not be returned until the following business day.  We are closed weekends and major holidays. You have access to a nurse at all times for urgent questions. Please call the main number to the clinic Dept: [phone] and follow the prompts.   For any non-urgent questions, you may also contact your provider using MyChart. We now offer e-Visits for anyone 18 and older to request care online for non-urgent symptoms. For details visit [email or web address].   Also download the MyChart app! Go to the app store, search "MyChart", open the app, select [facility], and log in with your MyChart username and password.  Due to Covid, a mask is required upon entering the hospital/clinic. If you do not have a mask, one will be given to you upon arrival. For doctor visits, patients may have 1 support person aged 18 or older with them. For treatment visits, patients cannot have anyone with them due to current Covid guidelines and our immunocompromised population.   

## 2021-04-04 NOTE — Telephone Encounter (Signed)
Patient notified of Prior Authorization approval for Oxycodone 5mg  IR tablets. Medication is approved for 30 tablets with a days supply of 15. Medication approved from 04/04/21 through 04/04/2022

## 2021-04-12 ENCOUNTER — Inpatient Hospital Stay: Payer: BC Managed Care – PPO

## 2021-04-12 ENCOUNTER — Telehealth: Payer: Self-pay

## 2021-04-12 NOTE — Telephone Encounter (Signed)
Called pt as she had not yet showed up for treatment.  Pt says she is not coming in today and will need to call back and reschedule.  I also sent a scheduling message to ensure this gets rescheduled.

## 2021-04-16 ENCOUNTER — Encounter: Payer: Self-pay | Admitting: *Deleted

## 2021-04-17 ENCOUNTER — Other Ambulatory Visit: Payer: BC Managed Care – PPO

## 2021-04-17 ENCOUNTER — Ambulatory Visit (INDEPENDENT_AMBULATORY_CARE_PROVIDER_SITE_OTHER): Payer: BC Managed Care – PPO | Admitting: Gastroenterology

## 2021-04-17 ENCOUNTER — Encounter: Payer: Self-pay | Admitting: Gastroenterology

## 2021-04-17 ENCOUNTER — Other Ambulatory Visit: Payer: Self-pay | Admitting: Gastroenterology

## 2021-04-17 VITALS — BP 132/88 | HR 100 | Ht 70.0 in | Wt 190.1 lb

## 2021-04-17 DIAGNOSIS — K581 Irritable bowel syndrome with constipation: Secondary | ICD-10-CM

## 2021-04-17 DIAGNOSIS — R1013 Epigastric pain: Secondary | ICD-10-CM

## 2021-04-17 DIAGNOSIS — Z1211 Encounter for screening for malignant neoplasm of colon: Secondary | ICD-10-CM

## 2021-04-17 DIAGNOSIS — K219 Gastro-esophageal reflux disease without esophagitis: Secondary | ICD-10-CM | POA: Diagnosis not present

## 2021-04-17 MED ORDER — TRULANCE 3 MG PO TABS
3.0000 mg | ORAL_TABLET | Freq: Every day | ORAL | 3 refills | Status: DC
Start: 2021-04-17 — End: 2021-04-18

## 2021-04-17 MED ORDER — DICYCLOMINE HCL 20 MG PO TABS: 20.0000 mg | ORAL_TABLET | Freq: Four times a day (QID) | ORAL | 3 refills | Status: DC

## 2021-04-17 NOTE — Patient Instructions (Signed)
If you are age 47 or older, your body mass index should be between 23-30. Your Body mass index is 27.28 kg/m. If this is out of the aforementioned range listed, please consider follow up with your Primary Care Provider.  If you are age 78 or younger, your body mass index should be between 19-25. Your Body mass index is 27.28 kg/m. If this is out of the aformentioned range listed, please consider follow up with your Primary Care Provider.   Your provider has requested that you go to the basement level for lab work before leaving today. Press "B" on the elevator. The lab is located at the first door on the left as you exit the elevator.  We have sent the following medications to your pharmacy for you to pick up at your convenience:Trulance 3 mg daily , Bentyl 20 mg every 6 hours as needed.  Try over the counter IB guard.  The [facility] providers would like to encourage you to use [facility] to communicate with providers for non-urgent requests or questions.  Due to long hold times on the telephone, sending your provider a message by [facility] may be a faster and more efficient way to get a response.  Please allow 48 business hours for a response.  Please remember that this is for non-urgent requests.   It was a pleasure to see you today!  Thank you for trusting me with your gastrointestinal care!    Scott E.Candis Schatz, MD

## 2021-04-17 NOTE — Progress Notes (Signed)
HPI : Catherine Ellison is a very pleasant 45 year old female with recently diagnosed breast cancer who is referred to [place] by Jens Som, NP for further evaluation of multiple chronic GI symptoms.  Patient states that she has had "GI issues for most of her life" but they seem to be more bothersome recently.  She describes having problems with postprandial abdominal pain, bloating and cramps most days of the week.  Constipation is a chronic problem for her.  She is currently taking tests, but this causes significant watery stools so she does not take it every day.  She states that she was previously taking Amitiza which she thinks worked better, but this was switched because of insurance reasons.  She sometimes takes MiraLAX which is effective.  She has tried taking Bentyl for her abdominal pain, which she says does help thinks it worsens her constipation. She was also having some problems with chest pain and upper abdominal discomfort following her Port-A-Cath placement, however these symptoms have improved somewhat.  She is taking Protonix twice daily  For her breast cancer, she is receiving Taxol every week last dose scheduled later this month.  She states she will be taking Adriamycin she will then undergo mastectomy and radiation.  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  She has a family history of colon cancer in her maternal grandmother and her maternal grandmother's sister.  She has never had an upper or lower endoscopy   Past Medical History:  Diagnosis Date   Allergy    seasonal    Anemia    Complication of anesthesia    bp dropped with 1st ovary surgery, no problems since with surgery   Dyspnea    with heavy exertion   GERD (gastroesophageal reflux disease)    History of gestational diabetes 2005   Hyperemesis gravidarum    with all pregnancies   IBS (irritable bowel syndrome)    left breast cancer     Peptic ulcer    Pneumonia      Past Surgical History:  Procedure Laterality Date   BILATERAL SALPINGECTOMY Bilateral 2014   emergency   LEFT OOPHORECTOMY Left 2006   PORTACATH PLACEMENT Right 02/28/2021   Procedure: INSERTION PORT-A-CATH;  Surgeon: Coralie Keens, MD;  Location: [facility];  Service: General;  Laterality: Right;   Family History  Problem Relation Age of Onset   Diabetes Mother    Hypertension Mother    Thyroid disease Mother    Diabetes Father    Heart disease Father        4V CABG 03/2016, age 100   Hyperlipidemia Father    Hypertension Father    Hypothyroidism Father    Diabetes Sister    Hypertension Sister    Hypertension Maternal Grandmother    Colon cancer Maternal Grandmother        dx. >50   Hypertension Maternal Grandfather    Hypertension Paternal Grandmother    Kidney disease Paternal Grandmother    Hypertension Paternal Grandfather    Lung cancer Paternal Grandfather    Breast cancer Maternal Aunt        dx. 65s   Social History   Tobacco Use   Smoking status: Never   Smokeless tobacco: Never  Vaping Use   Vaping Use: Never used  Substance Use Topics   Alcohol use: Yes    Alcohol/week: 5.0 standard drinks    Types: 1  Glasses of wine, 1 Cans of beer, 1 Shots of liquor, 2 Standard drinks or equivalent per week    Comment: 3-4 drinks per day   Drug use: No   Current Outpatient Medications  Medication Sig Dispense Refill   acetaminophen (TYLENOL) 325 MG tablet Take 650 mg by mouth every 6 (six) hours as needed.     dicyclomine (BENTYL) 10 MG capsule Take 10 mg by mouth as needed.     DULoxetine (CYMBALTA) 30 MG capsule Take 30 mg by mouth as needed.     famotidine (PEPCID) 20 MG tablet Take 1 tablet (20 mg total) by mouth 2 (two) times daily. 60 tablet 5   gabapentin (NEURONTIN) 300 MG capsule Take 1 capsule (300 mg total) by mouth 3 (three) times daily. 90 capsule 1   ibuprofen (ADVIL) 800 MG tablet Take 800 mg by  mouth.     lidocaine-prilocaine (EMLA) cream Apply to affected area once 30 g 3   LINACLOTIDE PO Take 72 mcg by mouth daily. linzess     Melatonin 3 MG TABS Take 1 tablet (3 mg total) by mouth at bedtime. (Patient taking differently: Take 3 mg by mouth at bedtime as needed.) 60 tablet 0   ondansetron (ZOFRAN) 8 MG tablet Take 1 tablet (8 mg total) by mouth 2 (two) times daily as needed (Nausea or vomiting). 30 tablet 1   oxyCODONE (OXY IR/ROXICODONE) 5 MG immediate release tablet Take 0.5-1 tablets (2.5-5 mg total) by mouth every 4 (four) hours as needed for severe pain. 30 tablet 0   pantoprazole (PROTONIX) 40 MG tablet Take 1 tablet (40 mg total) by mouth daily. 90 tablet 1   prochlorperazine (COMPAZINE) 10 MG tablet Take 1 tablet (10 mg total) by mouth every 6 (six) hours as needed (Nausea or vomiting). 30 tablet 1   venlafaxine XR (EFFEXOR-XR) 75 MG 24 hr capsule Take 1 capsule (75 mg total) by mouth daily with breakfast. Increase to 2 tab if tolerated after taking for 2 weeks. 90 capsule 3   No current facility-administered medications for this visit.   Allergies  Allergen Reactions   Latex Rash     Review of Systems: All systems reviewed and negative except where noted in HPI.    No results found.  Physical Exam: BP 132/88 (BP Location: Right Arm, Patient Position: Sitting, Cuff Size: Normal)   Pulse 100   Ht 5\' 10"  (1.778 m)   Wt 190 lb 2 oz (86.2 kg)   BMI 27.28 kg/m  Constitutional: Pleasant,well-developed, African-American female in no acute distress. HEENT: Normocephalic and atraumatic. Conjunctivae are normal. No scleral icterus. Cardiovascular: Normal rate, regular rhythm.  Pulmonary/chest: Effort normal and breath sounds normal. No wheezing, rales or rhonchi. Abdominal: Soft, nondistended, mild tenderness to palpation in the periumbilical and left lower quadrants, no rigidity, guarding or rebound tenderness. Bowel sounds active throughout. There are no masses  palpable. No hepatomegaly. Extremities: no edema Neurological: Alert and oriented to person place and time. Skin: Skin is warm and dry. No rashes noted. Psychiatric: Normal mood and affect. Behavior is normal.  CBC    Component Value Date/Time   WBC 5.8 04/04/2021 0813   RBC 4.00 04/04/2021 0813   HGB 9.2 (L) 04/04/2021 0813   HGB 9.5 (L) 06/30/2019 1551   HCT 29.8 (L) 04/04/2021 0813   HCT 30.8 (L) 06/30/2019 1551   PLT 395 04/04/2021 0813   PLT 389 06/30/2019 1551   MCV 74.5 (L) 04/04/2021 0813   MCV 72 (L) 06/30/2019  1551   MCH 23.0 (L) 04/04/2021 0813   MCHC 30.9 04/04/2021 0813   RDW 22.1 (H) 04/04/2021 0813   RDW 16.8 (H) 06/30/2019 1551   LYMPHSABS 2.6 04/04/2021 0813   LYMPHSABS 3.4 (H) 06/30/2019 1551   MONOABS 0.3 04/04/2021 0813   EOSABS 0.1 04/04/2021 0813   EOSABS 0.1 06/30/2019 1551   BASOSABS 0.1 04/04/2021 0813   BASOSABS 0.1 06/30/2019 1551    CMP     Component Value Date/Time   NA 138 04/04/2021 0813   NA 138 08/20/2016 1712   K 4.1 04/04/2021 0813   CL 107 04/04/2021 0813   CO2 23 04/04/2021 0813   GLUCOSE 107 (H) 04/04/2021 0813   BUN 13 04/04/2021 0813   BUN 11 08/20/2016 1712   CREATININE 0.93 04/04/2021 0813   CALCIUM 8.9 04/04/2021 0813   PROT 7.2 04/04/2021 0813   PROT 6.9 08/20/2016 1712   ALBUMIN 3.7 04/04/2021 0813   ALBUMIN 4.0 08/20/2016 1712   AST 15 04/04/2021 0813   ALT 13 04/04/2021 0813   ALKPHOS 96 04/04/2021 0813   BILITOT 0.3 04/04/2021 0813   GFRNONAA >60 04/04/2021 0813   GFRAA 88 08/20/2016 1712     ASSESSMENT AND PLAN: 44 year old female with recently diagnosed breast cancer currently undergoing neoadjuvant chemotherapy with multiple chronic GI symptoms to include abdominal pain, bloating, constipation and dyspepsia.  She was also recently found to be a carrier for a MUTYH mutation.  Having a single mutation here does not affect her colon cancer screening recommendations.  Her symptoms are most consistent with  constipation predominant IBS.  I recommended we try switching therapy to Trulance to see if this will help both her constipation and her abdominal pain.  She can continue Bentyl as well.  We will screen for H. pylori and celiac disease.  We will plan for upper and lower endoscopy, however we should do this after she has completed her cancer treatments.  Neither of these procedures are particularly urgently indicated, patient's symptoms are chronic and not progressive.  IBS-C - Trulance 3 mg p.o. daily -Stop Linzess - Continue Bentyl as needed  Colon cancer screening - Screening colonoscopy  Dyspepsia/GERD - Continue Protonix twice daily, okay to use Pepcid for breakthrough symptoms - H. pylori stool antigen, TTG/IgA -EGD  We will schedule procedures once patient's cancer treatment is complete.  The details, risks (including bleeding, perforation, infection, missed lesions, medication reactions and possible hospitalization or surgery if complications occur), benefits, and alternatives to EGD/colonoscopy with possible biopsy and possible polypectomy were discussed with the patient and she consents to proceed.    Safwan Tomei E. Candis Schatz, MD [facility]  CC:  [name], NP

## 2021-04-18 LAB — TISSUE TRANSGLUTAMINASE, IGA: (tTG) Ab, IgA: <1 U/mL

## 2021-04-18 LAB — IGA: Immunoglobulin A: 187 mg/dL (ref 47–310)

## 2021-04-18 MED FILL — Dexamethasone Sodium Phosphate Inj 100 MG/10ML: INTRAMUSCULAR | Qty: 1 | Status: AC

## 2021-04-19 ENCOUNTER — Other Ambulatory Visit: Payer: Self-pay

## 2021-04-19 ENCOUNTER — Inpatient Hospital Stay: Payer: BC Managed Care – PPO | Attending: Oncology

## 2021-04-19 ENCOUNTER — Inpatient Hospital Stay: Payer: BC Managed Care – PPO

## 2021-04-19 VITALS — BP 124/80 | HR 93 | Temp 97.8°F | Resp 16 | Wt 188.2 lb

## 2021-04-19 DIAGNOSIS — C50412 Malignant neoplasm of upper-outer quadrant of left female breast: Secondary | ICD-10-CM

## 2021-04-19 DIAGNOSIS — Z95828 Presence of other vascular implants and grafts: Secondary | ICD-10-CM

## 2021-04-19 DIAGNOSIS — Z5189 Encounter for other specified aftercare: Secondary | ICD-10-CM | POA: Insufficient documentation

## 2021-04-19 DIAGNOSIS — Z170 Estrogen receptor positive status [ER+]: Secondary | ICD-10-CM

## 2021-04-19 DIAGNOSIS — Z79899 Other long term (current) drug therapy: Secondary | ICD-10-CM | POA: Diagnosis not present

## 2021-04-19 DIAGNOSIS — Z5111 Encounter for antineoplastic chemotherapy: Secondary | ICD-10-CM | POA: Insufficient documentation

## 2021-04-19 DIAGNOSIS — C773 Secondary and unspecified malignant neoplasm of axilla and upper limb lymph nodes: Secondary | ICD-10-CM | POA: Diagnosis not present

## 2021-04-19 LAB — CBC WITH DIFFERENTIAL (CANCER CENTER ONLY)
Abs Immature Granulocytes: 0.01 10*3/uL (ref 0.00–0.07)
Basophils Absolute: 0.1 10*3/uL (ref 0.0–0.1)
Basophils Relative: 1 %
Eosinophils Absolute: 0.1 10*3/uL (ref 0.0–0.5)
Eosinophils Relative: 2 %
HCT: 31.2 % — ABNORMAL LOW (ref 36.0–46.0)
Hemoglobin: 9.5 g/dL — ABNORMAL LOW (ref 12.0–15.0)
Immature Granulocytes: 0 %
Lymphocytes Relative: 46 %
Lymphs Abs: 2.5 10*3/uL (ref 0.7–4.0)
MCH: 22.8 pg — ABNORMAL LOW (ref 26.0–34.0)
MCHC: 30.4 g/dL (ref 30.0–36.0)
MCV: 75 fL — ABNORMAL LOW (ref 80.0–100.0)
Monocytes Absolute: 0.6 10*3/uL (ref 0.1–1.0)
Monocytes Relative: 12 %
Neutro Abs: 2.1 10*3/uL (ref 1.7–7.7)
Neutrophils Relative %: 39 %
Platelet Count: 376 10*3/uL (ref 150–400)
RBC: 4.16 MIL/uL (ref 3.87–5.11)
RDW: 21.8 % — ABNORMAL HIGH (ref 11.5–15.5)
WBC Count: 5.3 10*3/uL (ref 4.0–10.5)
nRBC: 0 % (ref 0.0–0.2)

## 2021-04-19 LAB — CMP (CANCER CENTER ONLY)
ALT: 17 U/L (ref 0–44)
AST: 21 U/L (ref 15–41)
Albumin: 3.9 g/dL (ref 3.5–5.0)
Alkaline Phosphatase: 120 U/L (ref 38–126)
Anion gap: 9 (ref 5–15)
BUN: 11 mg/dL (ref 6–20)
CO2: 25 mmol/L (ref 22–32)
Calcium: 9 mg/dL (ref 8.9–10.3)
Chloride: 105 mmol/L (ref 98–111)
Creatinine: 1.08 mg/dL — ABNORMAL HIGH (ref 0.44–1.00)
GFR, Estimated: >60 mL/min (ref >60)
Glucose, Bld: 104 mg/dL — ABNORMAL HIGH (ref 70–99)
Potassium: 3.5 mmol/L (ref 3.5–5.1)
Sodium: 139 mmol/L (ref 135–145)
Total Bilirubin: 0.3 mg/dL (ref 0.3–1.2)
Total Protein: 7.5 g/dL (ref 6.5–8.1)

## 2021-04-19 MED ORDER — SODIUM CHLORIDE 0.9 % IV SOLN
10.0000 mg | Freq: Once | INTRAVENOUS | Status: AC
  Administered 2021-04-19: 10 mg via INTRAVENOUS
  Filled 2021-04-19: qty 10

## 2021-04-19 MED ORDER — SODIUM CHLORIDE 0.9 % IV SOLN: Freq: Once | INTRAVENOUS | Status: AC

## 2021-04-19 MED ORDER — HEPARIN SOD (PORK) LOCK FLUSH 100 UNIT/ML IV SOLN
500.0000 [IU] | Freq: Once | INTRAVENOUS | Status: AC | PRN
  Administered 2021-04-19: 500 [IU]

## 2021-04-19 MED ORDER — SODIUM CHLORIDE 0.9% FLUSH
10.0000 mL | Freq: Once | INTRAVENOUS | Status: AC
  Administered 2021-04-19: 10 mL

## 2021-04-19 MED ORDER — SODIUM CHLORIDE 0.9 % IV SOLN
80.0000 mg/m2 | Freq: Once | INTRAVENOUS | Status: AC
  Administered 2021-04-19: 168 mg via INTRAVENOUS
  Filled 2021-04-19: qty 28

## 2021-04-19 MED ORDER — SODIUM CHLORIDE 0.9% FLUSH
10.0000 mL | INTRAVENOUS | Status: DC | PRN
  Administered 2021-04-19: 10 mL

## 2021-04-19 MED ORDER — FAMOTIDINE 20 MG IN NS 100 ML IVPB
20.0000 mg | Freq: Once | INTRAVENOUS | Status: AC
  Administered 2021-04-19: 20 mg via INTRAVENOUS
  Filled 2021-04-19: qty 100

## 2021-04-19 MED ORDER — ALTEPLASE 2 MG IJ SOLR
2.0000 mg | Freq: Once | INTRAMUSCULAR | Status: AC
  Administered 2021-04-19: 2 mg
  Filled 2021-04-19: qty 2

## 2021-04-19 MED ORDER — DIPHENHYDRAMINE HCL 50 MG/ML IJ SOLN
25.0000 mg | Freq: Once | INTRAMUSCULAR | Status: AC
  Administered 2021-04-19: 25 mg via INTRAVENOUS
  Filled 2021-04-19: qty 1

## 2021-04-19 NOTE — Patient Instructions (Signed)
[facility] ONCOLOGY  Discharge Instructions: Thank you for choosing [facility] to provide your oncology and hematology care.   If you have a lab appointment with the [facility], please go directly to the [facility] and check in at the registration area.   Wear comfortable clothing and clothing appropriate for easy access to any Portacath or PICC line.   We strive to give you quality time with your provider. You may need to reschedule your appointment if you arrive late (15 or more minutes).  Arriving late affects you and other patients whose appointments are after yours.  Also, if you miss three or more appointments without notifying the office, you may be dismissed from the clinic at the provider's discretion.      For prescription refill requests, have your pharmacy contact our office and allow 72 hours for refills to be completed.    Today you received the following chemotherapy and/or immunotherapy agents: Taxol   To help prevent nausea and vomiting after your treatment, we encourage you to take your nausea medication as directed.  BELOW ARE SYMPTOMS THAT SHOULD BE REPORTED IMMEDIATELY: *FEVER GREATER THAN 100.4 F (38 C) OR HIGHER *CHILLS OR SWEATING *NAUSEA AND VOMITING THAT IS NOT CONTROLLED WITH YOUR NAUSEA MEDICATION *UNUSUAL SHORTNESS OF BREATH *UNUSUAL BRUISING OR BLEEDING *URINARY PROBLEMS (pain or burning when urinating, or frequent urination) *BOWEL PROBLEMS (unusual diarrhea, constipation, pain near the anus) TENDERNESS IN MOUTH AND THROAT WITH OR WITHOUT PRESENCE OF ULCERS (sore throat, sores in mouth, or a toothache) UNUSUAL RASH, SWELLING OR PAIN  UNUSUAL VAGINAL DISCHARGE OR ITCHING   Items with * indicate a potential emergency and should be followed up as soon as possible or go to the Emergency Department if any problems should occur.  Please show the CHEMOTHERAPY ALERT CARD or IMMUNOTHERAPY ALERT CARD at check-in to the  Emergency Department and triage nurse.  Should you have questions after your visit or need to cancel or reschedule your appointment, please contact [facility]  Dept: [phone]  and follow the prompts.  Office hours are 8:00 a.m. to 4:30 p.m. Monday - Friday. Please note that voicemails left after 4:00 p.m. may not be returned until the following business day.  We are closed weekends and major holidays. You have access to a nurse at all times for urgent questions. Please call the main number to the clinic Dept: [phone] and follow the prompts.   For any non-urgent questions, you may also contact your provider using MyChart. We now offer e-Visits for anyone 47 and older to request care online for non-urgent symptoms. For details visit [email or web address].   Also download the MyChart app! Go to the app store, search "MyChart", open the app, select [facility], and log in with your MyChart username and password.  Due to Covid, a mask is required upon entering the hospital/clinic. If you do not have a mask, one will be given to you upon arrival. For doctor visits, patients may have 1 support person aged 47 or older with them. For treatment visits, patients cannot have anyone with them due to current Covid guidelines and our immunocompromised population.

## 2021-04-22 NOTE — Progress Notes (Signed)
Hi Catherine Ellison,  Good news, your test for celiac disease was negative.  Please proceed with the H. Pylori test as well.

## 2021-04-25 MED FILL — Dexamethasone Sodium Phosphate Inj 100 MG/10ML: INTRAMUSCULAR | Qty: 1 | Status: AC

## 2021-04-26 ENCOUNTER — Inpatient Hospital Stay: Payer: BC Managed Care – PPO

## 2021-04-26 ENCOUNTER — Other Ambulatory Visit: Payer: Self-pay

## 2021-04-26 VITALS — BP 150/87 | HR 95 | Resp 18 | Ht 70.0 in | Wt 186.5 lb

## 2021-04-26 DIAGNOSIS — Z5111 Encounter for antineoplastic chemotherapy: Secondary | ICD-10-CM | POA: Diagnosis not present

## 2021-04-26 DIAGNOSIS — C50412 Malignant neoplasm of upper-outer quadrant of left female breast: Secondary | ICD-10-CM

## 2021-04-26 DIAGNOSIS — Z170 Estrogen receptor positive status [ER+]: Secondary | ICD-10-CM

## 2021-04-26 DIAGNOSIS — Z95828 Presence of other vascular implants and grafts: Secondary | ICD-10-CM

## 2021-04-26 LAB — CMP (CANCER CENTER ONLY)
ALT: 20 U/L (ref 0–44)
AST: 21 U/L (ref 15–41)
Albumin: 3.7 g/dL (ref 3.5–5.0)
Alkaline Phosphatase: 100 U/L (ref 38–126)
Anion gap: 11 (ref 5–15)
BUN: 16 mg/dL (ref 6–20)
CO2: 23 mmol/L (ref 22–32)
Calcium: 8.9 mg/dL (ref 8.9–10.3)
Chloride: 107 mmol/L (ref 98–111)
Creatinine: 0.87 mg/dL (ref 0.44–1.00)
GFR, Estimated: >60 mL/min (ref >60)
Glucose, Bld: 108 mg/dL — ABNORMAL HIGH (ref 70–99)
Potassium: 3.8 mmol/L (ref 3.5–5.1)
Sodium: 141 mmol/L (ref 135–145)
Total Bilirubin: 0.4 mg/dL (ref 0.3–1.2)
Total Protein: 7.2 g/dL (ref 6.5–8.1)

## 2021-04-26 LAB — CBC WITH DIFFERENTIAL (CANCER CENTER ONLY)
Abs Immature Granulocytes: 0.04 10*3/uL (ref 0.00–0.07)
Basophils Absolute: 0.1 10*3/uL (ref 0.0–0.1)
Basophils Relative: 1 %
Eosinophils Absolute: 0.2 10*3/uL (ref 0.0–0.5)
Eosinophils Relative: 2 %
HCT: 29.7 % — ABNORMAL LOW (ref 36.0–46.0)
Hemoglobin: 9.2 g/dL — ABNORMAL LOW (ref 12.0–15.0)
Immature Granulocytes: 1 %
Lymphocytes Relative: 42 %
Lymphs Abs: 2.6 10*3/uL (ref 0.7–4.0)
MCH: 23.1 pg — ABNORMAL LOW (ref 26.0–34.0)
MCHC: 31 g/dL (ref 30.0–36.0)
MCV: 74.4 fL — ABNORMAL LOW (ref 80.0–100.0)
Monocytes Absolute: 0.3 10*3/uL (ref 0.1–1.0)
Monocytes Relative: 5 %
Neutro Abs: 3.1 10*3/uL (ref 1.7–7.7)
Neutrophils Relative %: 49 %
Platelet Count: 343 10*3/uL (ref 150–400)
RBC: 3.99 MIL/uL (ref 3.87–5.11)
RDW: 21.3 % — ABNORMAL HIGH (ref 11.5–15.5)
WBC Count: 6.2 10*3/uL (ref 4.0–10.5)
nRBC: 0 % (ref 0.0–0.2)

## 2021-04-26 MED ORDER — DIPHENHYDRAMINE HCL 50 MG/ML IJ SOLN
25.0000 mg | Freq: Once | INTRAMUSCULAR | Status: AC
  Administered 2021-04-26: 25 mg via INTRAVENOUS
  Filled 2021-04-26: qty 1

## 2021-04-26 MED ORDER — SODIUM CHLORIDE 0.9% FLUSH
10.0000 mL | INTRAVENOUS | Status: DC | PRN
  Administered 2021-04-26: 10 mL

## 2021-04-26 MED ORDER — SODIUM CHLORIDE 0.9% FLUSH
10.0000 mL | Freq: Once | INTRAVENOUS | Status: AC
  Administered 2021-04-26: 10 mL

## 2021-04-26 MED ORDER — SODIUM CHLORIDE 0.9 % IV SOLN
10.0000 mg | Freq: Once | INTRAVENOUS | Status: AC
  Administered 2021-04-26: 10 mg via INTRAVENOUS
  Filled 2021-04-26: qty 1
  Filled 2021-04-26: qty 10

## 2021-04-26 MED ORDER — HEPARIN SOD (PORK) LOCK FLUSH 100 UNIT/ML IV SOLN
500.0000 [IU] | Freq: Once | INTRAVENOUS | Status: AC | PRN
  Administered 2021-04-26: 500 [IU]

## 2021-04-26 MED ORDER — SODIUM CHLORIDE 0.9 % IV SOLN: Freq: Once | INTRAVENOUS | Status: AC

## 2021-04-26 MED ORDER — FAMOTIDINE 20 MG IN NS 100 ML IVPB
20.0000 mg | Freq: Once | INTRAVENOUS | Status: AC
  Administered 2021-04-26: 20 mg via INTRAVENOUS
  Filled 2021-04-26: qty 100

## 2021-04-26 MED ORDER — SODIUM CHLORIDE 0.9 % IV SOLN
80.0000 mg/m2 | Freq: Once | INTRAVENOUS | Status: AC
  Administered 2021-04-26: 168 mg via INTRAVENOUS
  Filled 2021-04-26: qty 28

## 2021-04-26 NOTE — Patient Instructions (Signed)
[facility] ONCOLOGY  Discharge Instructions: Thank you for choosing [facility] to provide your oncology and hematology care.   If you have a lab appointment with the [facility], please go directly to the [facility] and check in at the registration area.   Wear comfortable clothing and clothing appropriate for easy access to any Portacath or PICC line.   We strive to give you quality time with your provider. You may need to reschedule your appointment if you arrive late (15 or more minutes).  Arriving late affects you and other patients whose appointments are after yours.  Also, if you miss three or more appointments without notifying the office, you may be dismissed from the clinic at the provider's discretion.      For prescription refill requests, have your pharmacy contact our office and allow 72 hours for refills to be completed.    Today you received the following chemotherapy and/or immunotherapy agents Taxol      To help prevent nausea and vomiting after your treatment, we encourage you to take your nausea medication as directed.  BELOW ARE SYMPTOMS THAT SHOULD BE REPORTED IMMEDIATELY: *FEVER GREATER THAN 100.4 F (38 C) OR HIGHER *CHILLS OR SWEATING *NAUSEA AND VOMITING THAT IS NOT CONTROLLED WITH YOUR NAUSEA MEDICATION *UNUSUAL SHORTNESS OF BREATH *UNUSUAL BRUISING OR BLEEDING *URINARY PROBLEMS (pain or burning when urinating, or frequent urination) *BOWEL PROBLEMS (unusual diarrhea, constipation, pain near the anus) TENDERNESS IN MOUTH AND THROAT WITH OR WITHOUT PRESENCE OF ULCERS (sore throat, sores in mouth, or a toothache) UNUSUAL RASH, SWELLING OR PAIN  UNUSUAL VAGINAL DISCHARGE OR ITCHING   Items with * indicate a potential emergency and should be followed up as soon as possible or go to the Emergency Department if any problems should occur.  Please show the CHEMOTHERAPY ALERT CARD or IMMUNOTHERAPY ALERT CARD at check-in to the  Emergency Department and triage nurse.  Should you have questions after your visit or need to cancel or reschedule your appointment, please contact [facility]  Dept: [phone]  and follow the prompts.  Office hours are 8:00 a.m. to 4:30 p.m. Monday - Friday. Please note that voicemails left after 4:00 p.m. may not be returned until the following business day.  We are closed weekends and major holidays. You have access to a nurse at all times for urgent questions. Please call the main number to the clinic Dept: [phone] and follow the prompts.   For any non-urgent questions, you may also contact your provider using MyChart. We now offer e-Visits for anyone 65 and older to request care online for non-urgent symptoms. For details visit [email or web address].   Also download the MyChart app! Go to the app store, search "MyChart", open the app, select [facility], and log in with your MyChart username and password.  Due to Covid, a mask is required upon entering the hospital/clinic. If you do not have a mask, one will be given to you upon arrival. For doctor visits, patients may have 1 support person aged 57 or older with them. For treatment visits, patients cannot have anyone with them due to current Covid guidelines and our immunocompromised population.

## 2021-05-02 ENCOUNTER — Other Ambulatory Visit: Payer: Self-pay

## 2021-05-02 DIAGNOSIS — Z170 Estrogen receptor positive status [ER+]: Secondary | ICD-10-CM

## 2021-05-02 MED FILL — Dexamethasone Sodium Phosphate Inj 100 MG/10ML: INTRAMUSCULAR | Qty: 1 | Status: AC

## 2021-05-03 ENCOUNTER — Inpatient Hospital Stay: Payer: BC Managed Care – PPO

## 2021-05-03 ENCOUNTER — Inpatient Hospital Stay (HOSPITAL_BASED_OUTPATIENT_CLINIC_OR_DEPARTMENT_OTHER): Payer: BC Managed Care – PPO | Admitting: Physician Assistant

## 2021-05-03 ENCOUNTER — Other Ambulatory Visit: Payer: Self-pay

## 2021-05-03 ENCOUNTER — Inpatient Hospital Stay: Payer: BC Managed Care – PPO | Admitting: Dietician

## 2021-05-03 ENCOUNTER — Encounter: Payer: Self-pay | Admitting: *Deleted

## 2021-05-03 VITALS — BP 148/98 | HR 97 | Temp 97.5°F | Resp 19 | Ht 70.0 in | Wt 188.1 lb

## 2021-05-03 DIAGNOSIS — Z5111 Encounter for antineoplastic chemotherapy: Secondary | ICD-10-CM | POA: Diagnosis not present

## 2021-05-03 DIAGNOSIS — Z170 Estrogen receptor positive status [ER+]: Secondary | ICD-10-CM | POA: Diagnosis not present

## 2021-05-03 DIAGNOSIS — Z95828 Presence of other vascular implants and grafts: Secondary | ICD-10-CM

## 2021-05-03 DIAGNOSIS — C50412 Malignant neoplasm of upper-outer quadrant of left female breast: Secondary | ICD-10-CM

## 2021-05-03 LAB — CMP (CANCER CENTER ONLY)
ALT: 14 U/L (ref 0–44)
AST: 16 U/L (ref 15–41)
Albumin: 3.8 g/dL (ref 3.5–5.0)
Alkaline Phosphatase: 93 U/L (ref 38–126)
Anion gap: 10 (ref 5–15)
BUN: 16 mg/dL (ref 6–20)
CO2: 24 mmol/L (ref 22–32)
Calcium: 8.9 mg/dL (ref 8.9–10.3)
Chloride: 107 mmol/L (ref 98–111)
Creatinine: 0.83 mg/dL (ref 0.44–1.00)
GFR, Estimated: >60 mL/min (ref >60)
Glucose, Bld: 91 mg/dL (ref 70–99)
Potassium: 3.9 mmol/L (ref 3.5–5.1)
Sodium: 141 mmol/L (ref 135–145)
Total Bilirubin: 0.5 mg/dL (ref 0.3–1.2)
Total Protein: 7.2 g/dL (ref 6.5–8.1)

## 2021-05-03 LAB — CBC WITH DIFFERENTIAL (CANCER CENTER ONLY)
Abs Immature Granulocytes: 0.02 10*3/uL (ref 0.00–0.07)
Basophils Absolute: 0 10*3/uL (ref 0.0–0.1)
Basophils Relative: 1 %
Eosinophils Absolute: 0.1 10*3/uL (ref 0.0–0.5)
Eosinophils Relative: 2 %
HCT: 29.2 % — ABNORMAL LOW (ref 36.0–46.0)
Hemoglobin: 9.2 g/dL — ABNORMAL LOW (ref 12.0–15.0)
Immature Granulocytes: 0 %
Lymphocytes Relative: 49 %
Lymphs Abs: 2.5 10*3/uL (ref 0.7–4.0)
MCH: 23.6 pg — ABNORMAL LOW (ref 26.0–34.0)
MCHC: 31.5 g/dL (ref 30.0–36.0)
MCV: 74.9 fL — ABNORMAL LOW (ref 80.0–100.0)
Monocytes Absolute: 0.3 10*3/uL (ref 0.1–1.0)
Monocytes Relative: 6 %
Neutro Abs: 2.1 10*3/uL (ref 1.7–7.7)
Neutrophils Relative %: 42 %
Platelet Count: 348 10*3/uL (ref 150–400)
RBC: 3.9 MIL/uL (ref 3.87–5.11)
RDW: 22.1 % — ABNORMAL HIGH (ref 11.5–15.5)
WBC Count: 5 10*3/uL (ref 4.0–10.5)
nRBC: 0 % (ref 0.0–0.2)

## 2021-05-03 MED ORDER — SODIUM CHLORIDE 0.9 % IV SOLN
80.0000 mg/m2 | Freq: Once | INTRAVENOUS | Status: AC
  Administered 2021-05-03: 168 mg via INTRAVENOUS
  Filled 2021-05-03: qty 28

## 2021-05-03 MED ORDER — FAMOTIDINE 20 MG IN NS 100 ML IVPB
20.0000 mg | Freq: Once | INTRAVENOUS | Status: AC
  Administered 2021-05-03: 20 mg via INTRAVENOUS
  Filled 2021-05-03: qty 100

## 2021-05-03 MED ORDER — HEPARIN SOD (PORK) LOCK FLUSH 100 UNIT/ML IV SOLN
500.0000 [IU] | Freq: Once | INTRAVENOUS | Status: AC | PRN
  Administered 2021-05-03: 500 [IU]

## 2021-05-03 MED ORDER — SODIUM CHLORIDE 0.9% FLUSH
10.0000 mL | INTRAVENOUS | Status: DC | PRN
  Administered 2021-05-03: 10 mL

## 2021-05-03 MED ORDER — SODIUM CHLORIDE 0.9% FLUSH
10.0000 mL | Freq: Once | INTRAVENOUS | Status: AC
  Administered 2021-05-03: 10 mL

## 2021-05-03 MED ORDER — SODIUM CHLORIDE 0.9 % IV SOLN
10.0000 mg | Freq: Once | INTRAVENOUS | Status: AC
  Administered 2021-05-03: 10 mg via INTRAVENOUS
  Filled 2021-05-03: qty 10

## 2021-05-03 MED ORDER — DIPHENHYDRAMINE HCL 50 MG/ML IJ SOLN
25.0000 mg | Freq: Once | INTRAMUSCULAR | Status: AC
  Administered 2021-05-03: 25 mg via INTRAVENOUS
  Filled 2021-05-03: qty 1

## 2021-05-03 MED ORDER — SODIUM CHLORIDE 0.9 % IV SOLN: Freq: Once | INTRAVENOUS | Status: AC

## 2021-05-03 NOTE — Patient Instructions (Signed)
[facility] ONCOLOGY  Discharge Instructions: Thank you for choosing [facility] to provide your oncology and hematology care.   If you have a lab appointment with the [facility], please go directly to the [facility] and check in at the registration area.   Wear comfortable clothing and clothing appropriate for easy access to any Portacath or PICC line.   We strive to give you quality time with your provider. You may need to reschedule your appointment if you arrive late (15 or more minutes).  Arriving late affects you and other patients whose appointments are after yours.  Also, if you miss three or more appointments without notifying the office, you may be dismissed from the clinic at the providers discretion.      For prescription refill requests, have your pharmacy contact our office and allow 72 hours for refills to be completed.    Today you received the following chemotherapy and/or immunotherapy agents: Paclitaxel      To help prevent nausea and vomiting after your treatment, we encourage you to take your nausea medication as directed.  BELOW ARE SYMPTOMS THAT SHOULD BE REPORTED IMMEDIATELY: *FEVER GREATER THAN 100.4 F (38 C) OR HIGHER *CHILLS OR SWEATING *NAUSEA AND VOMITING THAT IS NOT CONTROLLED WITH YOUR NAUSEA MEDICATION *UNUSUAL SHORTNESS OF BREATH *UNUSUAL BRUISING OR BLEEDING *URINARY PROBLEMS (pain or burning when urinating, or frequent urination) *BOWEL PROBLEMS (unusual diarrhea, constipation, pain near the anus) TENDERNESS IN MOUTH AND THROAT WITH OR WITHOUT PRESENCE OF ULCERS (sore throat, sores in mouth, or a toothache) UNUSUAL RASH, SWELLING OR PAIN  UNUSUAL VAGINAL DISCHARGE OR ITCHING   Items with * indicate a potential emergency and should be followed up as soon as possible or go to the Emergency Department if any problems should occur.  Please show the CHEMOTHERAPY ALERT CARD or IMMUNOTHERAPY ALERT CARD at check-in to  the Emergency Department and triage nurse.  Should you have questions after your visit or need to cancel or reschedule your appointment, please contact [facility]  Dept: [phone]  and follow the prompts.  Office hours are 8:00 a.m. to 4:30 p.m. Monday - Friday. Please note that voicemails left after 4:00 p.m. may not be returned until the following business day.  We are closed weekends and major holidays. You have access to a nurse at all times for urgent questions. Please call the main number to the clinic Dept: [phone] and follow the prompts.   For any non-urgent questions, you may also contact your provider using MyChart. We now offer e-Visits for anyone 47 and older to request care online for non-urgent symptoms. For details visit [email or web address].   Also download the MyChart app! Go to the app store, search "MyChart", open the app, select [facility], and log in with your MyChart username and password.  Due to Covid, a mask is required upon entering the hospital/clinic. If you do not have a mask, one will be given to you upon arrival. For doctor visits, patients may have 1 support person aged 67 or older with them. For treatment visits, patients cannot have anyone with them due to current Covid guidelines and our immunocompromised population.

## 2021-05-03 NOTE — Progress Notes (Signed)
[facility] OFFICE PROGRESS NOTE  Catherine Mola, NP No address on file  DIAGNOSIS: Estrogen receptor positive breast cancer  HISTORY OF CURRENT ILLNESS: Catherine Ellison "Catherine Ellison" presented with pain, erythema, warmth, and skin thickening to the left breast. She was treated with 10 days of Augmentin, which overall improved her symptoms. However, she had a persistent  lump in the upper-outer left breast. She underwent bilateral diagnostic mammography with tomography and bilateral breast ultrasonography at [facility] on 01/30/2021 showing: breast density category C; bilateral fibrocystic changes; 1.4 cm irregular mass in right breast at 3 o'clock; palpable, indeterminate 4.1 cm left breast mass at 2 o'clock; three abnormal nodes in left axilla; right axilla not imaged.   Accordingly on 01/30/2021 she proceeded to biopsy of the right breast area in question. The pathology from this procedure ([id number]) showed: predominantly fatty tissue with focal benign breast tissue.   She also underwent biopsy of the left breast area in question on 02/07/2021. The pathology from this procedure ([id number]) showed: invasive ductal carcinoma, grade 3. Prognostic indicators significant for: estrogen receptor, 70% positive with weak staining intensity and progesterone receptor, 0% negative. Proliferation marker Ki67 at 40%. HER2 equivocal by immunohistochemistry (2+), but negative by fluorescent in situ hybridization with a signals ratio 1.49 and number per cell 3.50.   She also underwent biopsy of a left axillary node yesterday, 02/12/2021. The pathology ([id number]) confirmed metastatic carcinoma involving nodal tissue.    Cancer Staging  Malignant neoplasm of upper-outer quadrant of left breast in female, estrogen receptor positive ([place]) Staging form: Breast, AJCC 8th Edition - Clinical stage from 02/13/2021: Stage IIIA (cT2, cN1, cM0, G3, ER+, PR-, HER2-) - Signed by [name], MD on  02/13/2021 Stage prefix: Initial diagnosis Histologic grading system: 3 grade system Laterality: Left Staged by: Pathologist and managing physician Stage used in treatment planning: Yes National guidelines used in treatment planning: Yes Type of national guideline used in treatment planning: NCCN   The patient's subsequent history is as detailed below.  CURRENT THERAPY: Neoadjuvant chemotherapy  INTERVAL HISTORY: Catherine Ellison 45 y.o. female returns to the clinic today for a follow-up visit.  The patient was last seen by a provider on 04/04/2021.  The patient has been undergoing weekly neoadjuvant chemotherapy with paclitaxel 80 mg per metered square.  She is here today for cycle #9.  Overall, she is tolerating it very well. With her infusion two weeks ago, she felt a burning sensation in her stomach on that one occasion; however, she tolerated her infusion last week well without any recurring symptoms. She notes that the palpable breast mass has decreased in size significantly to the point that she hardly feel it. She has some memory fog and chemo brain with her treatment. She was given a prescription for Effexor at her last appointment which was helpful for her. 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 She denies any peripheral neuropathy in her hands. She reports that if she stands for long periods she has an unusual sensation in her feet and needs to sit down/rest which improves her symptoms.   She denies any fever, chills, or night sweats. She reports her appetite comes and goes and she is scheduled to see the nutritionist in the infusion room today. She is wondering if she can have a prescription for ensure as  it is expensive. She reports hot flashes. Her last menstrual cycle was in October 2022. She has some darkening of the nails  with treatment. She also reports sternal, back, and hip discomfort about 2 days following treatment. She denies any chest pain, shortness of breath, cough, or hemoptysis. She denies any nausea, vomiting, or diarrhea. She has chronic constipation but states it has been "good lately". In the interval since her last appointment, the patient saw gastroenterology Dr. 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  Dr. Candis Schatz is planning on performing an upper and lower endoscopy as well as a screening colonoscopy when she completes her treatment for her breast cancer as neither of these procedures are urgently indicated.  The patient has some chronic GI issues including abdominal pain, bloating, constipation, dyspepsia which reportedly have been occurring for most of her life.  Gastroenterology feels that this is most consistent with constipation predominant IBS.  They recommended that she switch therapy to Trulance.  She will also continue taking Bentyl. She is here today for evaluation and repeat blood work before starting cycle #9.   MEDICAL HISTORY: Past Medical History:  Diagnosis Date   Allergy    seasonal    Anemia    Complication of anesthesia    bp dropped with 1st ovary surgery, no problems since with surgery   Dyspnea    with heavy exertion   GERD (gastroesophageal reflux disease)    History of gestational diabetes 2005   Hyperemesis gravidarum    with all pregnancies   IBS (irritable bowel syndrome)    left breast cancer    Peptic ulcer    Pneumonia     ALLERGIES:  is allergic to latex.  MEDICATIONS:  Current Outpatient Medications  Medication Sig Dispense Refill   acetaminophen (TYLENOL) 325 MG tablet Take 650 mg by mouth every 6 (six) hours as needed.     dicyclomine (BENTYL) 20 MG tablet Take 1 tablet (20 mg total) by mouth every 6 (six) hours. 30 tablet 3   DULoxetine (CYMBALTA) 30 MG capsule Take 30 mg by mouth as needed.     famotidine (PEPCID) 20 MG  tablet Take 1 tablet (20 mg total) by mouth 2 (two) times daily. 60 tablet 5   gabapentin (NEURONTIN) 300 MG capsule Take 1 capsule (300 mg total) by mouth 3 (three) times daily. 90 capsule 1   ibuprofen (ADVIL) 800 MG tablet Take 800 mg by mouth.     lidocaine-prilocaine (EMLA) cream Apply to affected area once 30 g 3   LINACLOTIDE PO Take 72 mcg by mouth daily. linzess     Melatonin 3 MG TABS Take 1 tablet (3 mg total) by mouth at bedtime. (Patient taking differently: Take 3 mg by mouth at bedtime as needed.) 60 tablet 0   ondansetron (ZOFRAN) 8 MG tablet Take 1 tablet (8 mg total) by mouth 2 (two) times daily as needed (Nausea or vomiting). 30 tablet 1   oxyCODONE (OXY IR/ROXICODONE) 5 MG immediate release tablet Take 0.5-1 tablets (2.5-5 mg total) by mouth every 4 (four) hours as needed for severe pain. 30 tablet 0   pantoprazole (PROTONIX) 40 MG tablet Take 1 tablet (40 mg total) by mouth daily. 90 tablet 1   prochlorperazine (COMPAZINE) 10 MG tablet Take 1 tablet (10 mg total) by mouth every 6 (six) hours as needed (Nausea or vomiting). 30 tablet 1   TRULANCE 3 MG TABS TAKE 1 TABLET BY MOUTH DAILY 60 tablet 3  venlafaxine XR (EFFEXOR-XR) 75 MG 24 hr capsule Take 1 capsule (75 mg total) by mouth daily with breakfast. Increase to 2 tab if tolerated after taking for 2 weeks. 90 capsule 3   No current facility-administered medications for this visit.   Facility-Administered Medications Ordered in Other Visits  Medication Dose Route Frequency Provider Last Rate Last Admin   dexamethasone (DECADRON) 10 mg in sodium chloride 0.9 % 50 mL IVPB  10 mg Intravenous Once Magrinat, Virgie Dad, MD 204 mL/hr at 05/03/21 0952 10 mg at 05/03/21 0952   famotidine (PEPCID) IVPB 20 mg in NS 100 mL IVPB  20 mg Intravenous Once Magrinat, Virgie Dad, MD 400 mL/hr at 05/03/21 0951 20 mg at 05/03/21 0951   heparin lock flush 100 unit/mL  500 Units Intracatheter Once PRN Magrinat, Virgie Dad, MD       PACLitaxel (TAXOL)  168 mg in sodium chloride 0.9 % 250 mL chemo infusion (</= 2m/m2)  80 mg/m2 (Treatment Plan Recorded) Intravenous Once Magrinat, GVirgie Dad MD       sodium chloride flush (NS) 0.9 % injection 10 mL  10 mL Intracatheter PRN Magrinat, GVirgie Dad MD        Her parents are both living-- her mother at age [id number] her father at age [id number] of 01/2021. Catherine Ellison has one brother and two sisters. She reports breast cancer in a maternal aunt in her 441's lung cancer in her paternal grandfather (tobacco user), and colon cancer in her maternal grandmother and a maternal great-aunt (mother's aunt). Of note, one of her sisters underwent genetic testing for keloids, and results were negative.     GYNECOLOGIC HISTORY:  No LMP recorded. (Menstrual status: Irregular Periods). Menarche: 45years old Age at first live birth: 45years old [place]P 3 LMP 01/30/2021, irregular and heavy Contraceptive: never used (not currently active) HRT n/a  Hysterectomy? no BSO? Yes, with benign pathology SURGICAL HISTORY:  Past Surgical History:  Procedure Laterality Date   BILATERAL SALPINGECTOMY Bilateral 2014   emergency   LEFT OOPHORECTOMY Left 2006   PORTACATH PLACEMENT Right 02/28/2021   Procedure: INSERTION PORT-A-CATH;  Surgeon: BCoralie Keens MD;  [facility]  Service: General;  Laterality: Right;   SOCIAL HISTORY: (updated 01/2021)  Catherine "Catherine Ellison" is currently working as a nMarine scientist She is currently attending classes on Mon, Wed, and Thurs. She is single. She lives at home with son DOtho Ket age 47 Son EMichaela Corner age 47 is a pTraining and development officerin [place] Son Royal LRee Edman, age 47 is a [place]balist[place] Catherine Ellison has two grandchildren, a girl age [id number] a boy age [id number] living [place] not in place; will likely name her mother, JAjanae Virag as her HCPOA. She can be reached at [phone] REVIEW OF SYSTEMS:   Review of Systems   Constitutional: Positive for fatigue, waxing and waning appetite, and memory fog. Negative for chills, fever and unexpected weight change.  HENT: Negative for mouth sores, nosebleeds, sore throat and trouble swallowing.   Eyes: Negative for eye problems and icterus.  Respiratory: Negative for cough, hemoptysis, shortness of breath and wheezing.   Cardiovascular: Negative for chest pain and leg swelling.  Gastrointestinal: Positive for chronic constipation. Negative for abdominal pain,  diarrhea, nausea and vomiting.  Genitourinary: Negative for  bladder incontinence, difficulty urinating, dysuria, frequency and hematuria.   Musculoskeletal: Positive for joint pain for 2 days following treatment. Negative for gait problem, neck pain and neck stiffness.  Skin: Negative for itching and rash.  Neurological: Negative for dizziness, extremity weakness, gait problem, headaches, light-headedness and seizures.  Hematological: Negative for adenopathy. Does not bruise/bleed easily.  Psychiatric/Behavioral: Negative for confusion, depression and sleep disturbance. The patient is not nervous/anxious.     PHYSICAL EXAMINATION:  Blood pressure (!) 148/98, pulse 97, temperature (!) 97.5 F (36.4 C), temperature source Tympanic, resp. rate 19, height 5' 10" (1.778 m), weight 188 lb 1.6 oz (85.3 kg), SpO2 99 %.  ECOG PERFORMANCE STATUS: 1  Physical Exam  Constitutional: Oriented to person, place, and time and well-developed, well-nourished, and in no distress.  HENT:  Head: Normocephalic and atraumatic.  Mouth/Throat: Oropharynx is clear and moist. No oropharyngeal exudate.  Eyes: Conjunctivae are normal. Right eye exhibits no discharge. Left eye exhibits no discharge. No scleral icterus.  Neck: Normal range of motion. Neck supple.  Cardiovascular: Normal rate, regular rhythm, normal heart sounds and intact distal pulses.   Pulmonary/Chest: Effort normal and breath sounds normal. No respiratory distress.  No wheezes. No rales.  Abdominal: Soft. Bowel sounds are normal. Exhibits no distension and no mass. There is no tenderness.  Musculoskeletal: Normal range of motion. Exhibits no edema.  Lymphadenopathy:    No cervical adenopathy.  Neurological: Alert and oriented to person, place, and time. Exhibits normal muscle tone. Gait normal. Coordination normal.  Skin: Skin is warm and dry. No rash noted. Not diaphoretic. No erythema. No pallor.  Psychiatric: Mood, memory and judgment normal.  Vitals reviewed.  LABORATORY DATA: Lab Results  Component Value Date   WBC 5.0 05/03/2021   HGB 9.2 (L) 05/03/2021   HCT 29.2 (L) 05/03/2021   MCV 74.9 (L) 05/03/2021   PLT 348 05/03/2021      Chemistry      Component Value Date/Time   NA 141 05/03/2021 0848   NA 138 08/20/2016 1712   K 3.9 05/03/2021 0848   CL 107 05/03/2021 0848   CO2 24 05/03/2021 0848   BUN 16 05/03/2021 0848   BUN 11 08/20/2016 1712   CREATININE 0.83 05/03/2021 0848      Component Value Date/Time   CALCIUM 8.9 05/03/2021 0848   ALKPHOS 93 05/03/2021 0848   AST 16 05/03/2021 0848   ALT 14 05/03/2021 0848   BILITOT 0.5 05/03/2021 0848       RADIOGRAPHIC STUDIES:  No results found.   ASSESSMENT/PLAN:  45 y.o. [place], [place] woman status post left breast upper outer quadrant biopsy 02/07/2021 for a clinical T2 Ni, stage IIIA invasive ductal carcinoma, grade 3, estrogen receptor weakly positive, progesterone receptor and HER2 negative, with an MIB-1 of 40%.   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 Specifically, this variant is p.R247*. 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 (B) Colon Cancer Screening recommendations on MUTYH single pathogenic variant: If an individual has a first-degree relative with colorectal cancer,  screening should begin at age 70 or 24 years prior to the relatives age at diagnosis, whichever comes first and repeat colonoscopies every 5 years.  Grandmother, and aunt with FH of colon cancer.  (2) neoadjuvant chemotherapy will consist of paclitaxel weekly x12 followed by dose dense doxorubicin and cyclophosphamide x4   (  3) definitive surgery to follow   (4) adjuvant radiation as appropriate   (5) antiestrogens.  Plan:  Catherine Ellison is here for follow-up prior to receiving her next cycle of neoadjuvant chemotherapy.  She was last seen by provider several weeks ago.  Despite this, she is tolerating treatment fairly well without any concerning complaints.  Labs were reviewed which are acceptable for treatment. She has noticed significant shrinkage of her breast mass.   She is scheduled to meet with a member of the nutritionist team while in the infusion room today. I gave her a coupon for ensure and will be happy to write her a prescription for ensure after her evaluation with nutrition.   I will arrange for the patient to see Dr. Jana Hakim either independently or in conjunction with his APP next week prior to starting cycle #10 to close the loop/ensure smooth transition due to his upcoming retirement.  They will help ensure appropriate future follow-ups with one of our colleagues to take over Arti's care.    Advised to monitor for signs and symptoms of peripheral neuropathy.   She will continue to follow with GI non-emergently for her upper and lower endoscopy due to her MUTYH mutation once she is finished with chemotherapy.   The patient was advised to call immediately if she has any concerning symptoms in the interval. The patient voices understanding of current disease status and treatment options and is in agreement with the current care plan. All questions were answered. The patient knows to call the clinic with any problems, questions or concerns. We can certainly see the patient much  sooner if necessary       No orders of the defined types were placed in this encounter.    The total time spent in the appointment was 20-29 minutes.   Cassandra L Heilingoetter, PA-C 05/03/21

## 2021-05-03 NOTE — Progress Notes (Signed)
Nutrition Assessment   Reason for Assessment: Patient request   ASSESSMENT: 45 year old female with estrogen positive breast cancer of left breast. She is receiving neoadjuvant chemotherapy with weekly Paclitaxel. Patient is followed by Dr. Jana Hakim.   Past medical history includes GERD, IBS, gestational diabetes  Met with patient during infusion. She reports a decrease in appetite and fatigue since starting chemotherapy. Patient has not been cooking at home as frequently, reports often ordering food for her son only, nothing sounds good to her. Yesterday patient recalls piece of fish with broccoli and cheese for lunch, single small piece of chicken for dinner. Patient reports drinking 2-3 Ensure supplements/day. She tolerates these well, however they have become too expensive. Patient has chronic constipation secondary to IBS. She is followed by outpatient GI and awaiting insurance approval for trulance. Patient reports taking Bentyl which helps some. She is drinking 3-4 bottles of water daily along with apple and orange juices. She denies nausea, vomiting.   Nutrition Focused Physical Exam: deferred   Medications: Bentyl, gabapentin, melatonin, oxycodone, protonix, effexor, zofran, pepcid, cymbalta, compazine, trulance   Labs: reviewed   Anthropometrics:   Height: 5'10" Weight: 188 lb 1.6 oz UBW: 192 lb 3.2 oz (02/13/21) BMI: 26.99  Estimated Needs: 2350-2560 kcal, 120-135 grams protein, 2.4 L fluids  NUTRITION DIAGNOSIS: Inadequate oral intake related to cancer and associated treatment side effects as evidenced by reported decreased appetite and 24 hour dietary recall meeting <75% of estimated needs.     INTERVENTION:  Educated on importance of adequate calorie and protein energy intake to maintain weights, strength, nutrition Encouraged small frequent meals and snacks through out the day - handout with high calorie, high protein snacks provided Educated on good sources of  protein to include with meals and snacks Continue drinking 2-3 Ensure Plus/equivalent for weight maintenance - coupons and 2 Ensure sample bottles provided Discussed strategies for constipation - handout with tips provided  Contact information given   MONITORING, EVALUATION, GOAL: Patient will tolerate increased calories and protein to minimize weight loss   Next Visit: To be scheduled as needed, pt has contact information and encouraged to call with nutrition questions/concerns

## 2021-05-07 ENCOUNTER — Encounter: Payer: Self-pay | Admitting: *Deleted

## 2021-05-09 ENCOUNTER — Encounter: Payer: Self-pay | Admitting: Adult Health

## 2021-05-09 ENCOUNTER — Inpatient Hospital Stay: Payer: BC Managed Care – PPO

## 2021-05-09 ENCOUNTER — Inpatient Hospital Stay (HOSPITAL_BASED_OUTPATIENT_CLINIC_OR_DEPARTMENT_OTHER): Payer: BC Managed Care – PPO | Admitting: Adult Health

## 2021-05-09 ENCOUNTER — Other Ambulatory Visit: Payer: Self-pay

## 2021-05-09 VITALS — BP 113/94 | HR 88 | Temp 97.7°F | Resp 18 | Ht 70.0 in | Wt 194.3 lb

## 2021-05-09 DIAGNOSIS — Z170 Estrogen receptor positive status [ER+]: Secondary | ICD-10-CM

## 2021-05-09 DIAGNOSIS — C50412 Malignant neoplasm of upper-outer quadrant of left female breast: Secondary | ICD-10-CM

## 2021-05-09 DIAGNOSIS — Z5111 Encounter for antineoplastic chemotherapy: Secondary | ICD-10-CM | POA: Diagnosis not present

## 2021-05-09 DIAGNOSIS — Z95828 Presence of other vascular implants and grafts: Secondary | ICD-10-CM

## 2021-05-09 LAB — CBC WITH DIFFERENTIAL (CANCER CENTER ONLY)
Abs Immature Granulocytes: 0.02 10*3/uL (ref 0.00–0.07)
Basophils Absolute: 0 10*3/uL (ref 0.0–0.1)
Basophils Relative: 1 %
Eosinophils Absolute: 0 10*3/uL (ref 0.0–0.5)
Eosinophils Relative: 1 %
HCT: 29 % — ABNORMAL LOW (ref 36.0–46.0)
Hemoglobin: 8.9 g/dL — ABNORMAL LOW (ref 12.0–15.0)
Immature Granulocytes: 1 %
Lymphocytes Relative: 49 %
Lymphs Abs: 2.2 10*3/uL (ref 0.7–4.0)
MCH: 23.5 pg — ABNORMAL LOW (ref 26.0–34.0)
MCHC: 30.7 g/dL (ref 30.0–36.0)
MCV: 76.7 fL — ABNORMAL LOW (ref 80.0–100.0)
Monocytes Absolute: 0.3 10*3/uL (ref 0.1–1.0)
Monocytes Relative: 6 %
Neutro Abs: 1.8 10*3/uL (ref 1.7–7.7)
Neutrophils Relative %: 42 %
Platelet Count: 422 10*3/uL — ABNORMAL HIGH (ref 150–400)
RBC: 3.78 MIL/uL — ABNORMAL LOW (ref 3.87–5.11)
RDW: 22.9 % — ABNORMAL HIGH (ref 11.5–15.5)
WBC Count: 4.3 10*3/uL (ref 4.0–10.5)
nRBC: 0 % (ref 0.0–0.2)

## 2021-05-09 LAB — CMP (CANCER CENTER ONLY)
ALT: 13 U/L (ref 0–44)
AST: 15 U/L (ref 15–41)
Albumin: 3.9 g/dL (ref 3.5–5.0)
Alkaline Phosphatase: 86 U/L (ref 38–126)
Anion gap: 5 (ref 5–15)
BUN: 11 mg/dL (ref 6–20)
CO2: 28 mmol/L (ref 22–32)
Calcium: 9.2 mg/dL (ref 8.9–10.3)
Chloride: 105 mmol/L (ref 98–111)
Creatinine: 0.84 mg/dL (ref 0.44–1.00)
GFR, Estimated: >60 mL/min (ref >60)
Glucose, Bld: 100 mg/dL — ABNORMAL HIGH (ref 70–99)
Potassium: 3.9 mmol/L (ref 3.5–5.1)
Sodium: 138 mmol/L (ref 135–145)
Total Bilirubin: 0.5 mg/dL (ref 0.3–1.2)
Total Protein: 6.9 g/dL (ref 6.5–8.1)

## 2021-05-09 MED ORDER — HEPARIN SOD (PORK) LOCK FLUSH 100 UNIT/ML IV SOLN
500.0000 [IU] | Freq: Once | INTRAVENOUS | Status: AC | PRN
  Administered 2021-05-09: 13:00:00 500 [IU]

## 2021-05-09 MED ORDER — ALTEPLASE 2 MG IJ SOLR
2.0000 mg | Freq: Once | INTRAMUSCULAR | Status: AC
  Administered 2021-05-09: 09:00:00 2 mg
  Filled 2021-05-09: qty 2

## 2021-05-09 MED ORDER — DIPHENHYDRAMINE HCL 50 MG/ML IJ SOLN
25.0000 mg | Freq: Once | INTRAMUSCULAR | Status: AC
  Administered 2021-05-09: 11:00:00 25 mg via INTRAVENOUS
  Filled 2021-05-09: qty 1

## 2021-05-09 MED ORDER — SODIUM CHLORIDE 0.9 % IV SOLN
10.0000 mg | Freq: Once | INTRAVENOUS | Status: AC
  Administered 2021-05-09: 11:00:00 10 mg via INTRAVENOUS
  Filled 2021-05-09: qty 10

## 2021-05-09 MED ORDER — SODIUM CHLORIDE 0.9% FLUSH
10.0000 mL | INTRAVENOUS | Status: DC | PRN
  Administered 2021-05-09: 13:00:00 10 mL

## 2021-05-09 MED ORDER — SODIUM CHLORIDE 0.9% FLUSH
10.0000 mL | Freq: Once | INTRAVENOUS | Status: AC
Start: 2021-05-09 — End: 2021-05-09
  Administered 2021-05-09: 09:00:00 10 mL

## 2021-05-09 MED ORDER — SODIUM CHLORIDE 0.9 % IV SOLN: Freq: Once | INTRAVENOUS | Status: AC

## 2021-05-09 MED ORDER — FAMOTIDINE 20 MG IN NS 100 ML IVPB
20.0000 mg | Freq: Once | INTRAVENOUS | Status: AC
  Administered 2021-05-09: 11:00:00 20 mg via INTRAVENOUS
  Filled 2021-05-09: qty 100

## 2021-05-09 MED ORDER — SODIUM CHLORIDE 0.9 % IV SOLN
80.0000 mg/m2 | Freq: Once | INTRAVENOUS | Status: AC
  Administered 2021-05-09: 12:00:00 168 mg via INTRAVENOUS
  Filled 2021-05-09: qty 28

## 2021-05-09 NOTE — Patient Instructions (Signed)
[facility]   Discharge Instructions: Thank you for choosing [facility] to provide your oncology and hematology care.   If you have a lab appointment with the Cancer Center, please go directly to the Cancer Center and check in at the registration area.   Wear comfortable clothing and clothing appropriate for easy access to any Portacath or PICC line.   We strive to give you quality time with your provider. You may need to reschedule your appointment if you arrive late (15 or more minutes).  Arriving late affects you and other patients whose appointments are after yours.  Also, if you miss three or more appointments without notifying the office, you may be dismissed from the clinic at the provider's discretion.      For prescription refill requests, have your pharmacy contact our office and allow 72 hours for refills to be completed.    Today you received the following chemotherapy and/or immunotherapy agents: paclitaxel.      To help prevent nausea and vomiting after your treatment, we encourage you to take your nausea medication as directed.  BELOW ARE SYMPTOMS THAT SHOULD BE REPORTED IMMEDIATELY: *FEVER GREATER THAN 100.4 F (38 C) OR HIGHER *CHILLS OR SWEATING *NAUSEA AND VOMITING THAT IS NOT CONTROLLED WITH YOUR NAUSEA MEDICATION *UNUSUAL SHORTNESS OF BREATH *UNUSUAL BRUISING OR BLEEDING *URINARY PROBLEMS (pain or burning when urinating, or frequent urination) *BOWEL PROBLEMS (unusual diarrhea, constipation, pain near the anus) TENDERNESS IN MOUTH AND THROAT WITH OR WITHOUT PRESENCE OF ULCERS (sore throat, sores in mouth, or a toothache) UNUSUAL RASH, SWELLING OR PAIN  UNUSUAL VAGINAL DISCHARGE OR ITCHING   Items with * indicate a potential emergency and should be followed up as soon as possible or go to the Emergency Department if any problems should occur.  Please show the CHEMOTHERAPY ALERT CARD or IMMUNOTHERAPY ALERT CARD at check-in  to the Emergency Department and triage nurse.  Should you have questions after your visit or need to cancel or reschedule your appointment, please contact [facility]  Dept: [phone]  and follow the prompts.  Office hours are 8:00 a.m. to 4:30 p.m. Monday - Friday. Please note that voicemails left after 4:00 p.m. may not be returned until the following business day.  We are closed weekends and major holidays. You have access to a nurse at all times for urgent questions. Please call the main number to the clinic Dept: [phone] and follow the prompts.   For any non-urgent questions, you may also contact your provider using MyChart. We now offer e-Visits for anyone 18 and older to request care online for non-urgent symptoms. For details visit [email or web address].   Also download the MyChart app! Go to the app store, search "MyChart", open the app, select [facility], and log in with your MyChart username and password.  Due to Covid, a mask is required upon entering the hospital/clinic. If you do not have a mask, one will be given to you upon arrival. For doctor visits, patients may have 1 support person aged 18 or older with them. For treatment visits, patients cannot have anyone with them due to current Covid guidelines and our immunocompromised population.   

## 2021-05-09 NOTE — Progress Notes (Signed)
[facility]  Telephone:([phone]) [phone] Fax:(336) [phone]     ID: Catherine Ellison DOB: 04/04/1976  MR#: [id number]  CSN#:[id number]  Patient Care Team: [name], NP as PCP - General (Adult Health Nurse Practitioner) Coralie Keens, MD as Consulting Physician (General Surgery) Magrinat, Virgie Dad, MD as Consulting Physician (Oncology) Eppie Gibson, MD as Attending Physician (Radiation Oncology) Rockwell Germany, RN as Oncology Nurse Navigator Mauro Kaufmann, RN as Oncology Nurse Navigator Lekha Dancer, Charlestine Massed, NP as Nurse Practitioner (Hematology and Oncology) Scot Dock, NP OTHER MD:  CHIEF COMPLAINT: Estrogen receptor positive breast cancer  CURRENT TREATMENT: Neoadjuvant chemotherapy   HISTORY OF CURRENT ILLNESS: Catherine Lore "T" presented with pain, erythema, warmth, and skin thickening to the left breast. She was treated with 10 days of Augmentin, which overall improved her symptoms. However, she had a persistent  lump in the upper-outer left breast. She underwent bilateral diagnostic mammography with tomography and bilateral breast ultrasonography at [facility] on 01/30/2021 showing: breast density category C; bilateral fibrocystic changes; 1.4 cm irregular mass in right breast at 3 o'clock; palpable, indeterminate 4.1 cm left breast mass at 2 o'clock; three abnormal nodes in left axilla; right axilla not imaged.  Accordingly on 01/30/2021 she proceeded to biopsy of the right breast area in question. The pathology from this procedure ([id number]) showed: predominantly fatty tissue with focal benign breast tissue.  She also underwent biopsy of the left breast area in question on 02/07/2021. The pathology from this procedure ([id number]) showed: invasive ductal carcinoma, grade 3. Prognostic indicators significant for: estrogen receptor, 70% positive with weak staining intensity and progesterone receptor, 0% negative. Proliferation  marker Ki67 at 40%. HER2 equivocal by immunohistochemistry (2+), but negative by fluorescent in situ hybridization with a signals ratio 1.49 and number per cell 3.50.  She also underwent biopsy of a left axillary node yesterday, 02/12/2021. The pathology ([id number]) confirmed metastatic carcinoma involving nodal tissue.   Cancer Staging  Malignant neoplasm of upper-outer quadrant of left breast in female, estrogen receptor positive ([place]) Staging form: Breast, AJCC 8th Edition - Clinical stage from 02/13/2021: Stage IIIA (cT2, cN1, cM0, G3, ER+, PR-, HER2-) - Signed by [name], MD on 02/13/2021 Stage prefix: Initial diagnosis Histologic grading system: 3 grade system Laterality: Left Staged by: Pathologist and managing physician Stage used in treatment planning: Yes National guidelines used in treatment planning: Yes Type of national guideline used in treatment planning: NCCN  The patient's subsequent history is as detailed below.   INTERVAL HISTORY: Catherine Ellison is here today for continued follow-up for her estrogen positive breast cancer.  She continues on neoadjuvant chemotherapy.  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  We had put her on Effexor to help with some attention and anxiety issues which has worked Recruitment consultant for her.  She recently went to her pinning ceremony and has successfully completed and graduated from nursing school.  She continues to tolerate the weekly Taxol quite well.  Today she will receive Ellison 10 out of 12 of therapy.  She will then go on to receive doxorubicin and cyclophosphamide given every 2 weeks with Udenyca support given on day 3.  Catherine Ellison denies any peripheral neuropathy, nausea, vomiting, mucositis.  She does have some mild fatigue.  Otherwise she is tolerating her treatment quite well.  REVIEW OF SYSTEMS: Review of Systems  Constitutional:  Positive for fatigue. Negative for appetite  change, chills, fever and unexpected weight  change.  HENT:   Negative for hearing loss, lump/mass, mouth sores and trouble swallowing.   Eyes:  Negative for eye problems and icterus.  Respiratory:  Negative for chest tightness, cough and shortness of breath.   Cardiovascular:  Negative for chest pain, leg swelling and palpitations.  Gastrointestinal:  Negative for abdominal distention, abdominal pain, constipation, diarrhea, nausea and vomiting.  Endocrine: Negative for hot flashes.  Genitourinary:  Negative for difficulty urinating.   Musculoskeletal:  Positive for back pain. Negative for arthralgias.  Skin:  Negative for itching and rash.  Neurological:  Negative for dizziness, extremity weakness, headaches and numbness.  Hematological:  Negative for adenopathy. Does not bruise/bleed easily.  Psychiatric/Behavioral:  Negative for decreased concentration, depression and suicidal ideas. The patient is not nervous/anxious.      COVID 19 VACCINATION STATUS:    PAST MEDICAL HISTORY: Past Medical History:  Diagnosis Date   Allergy    seasonal    Anemia    Complication of anesthesia    bp dropped with 1st ovary surgery, no problems since with surgery   Dyspnea    with heavy exertion   GERD (gastroesophageal reflux disease)    History of gestational diabetes 2005   Hyperemesis gravidarum    with all pregnancies   IBS (irritable bowel syndrome)    left breast cancer    Peptic ulcer    Pneumonia     PAST SURGICAL HISTORY: Past Surgical History:  Procedure Laterality Date   BILATERAL SALPINGECTOMY Bilateral 2014   emergency   LEFT OOPHORECTOMY Left 2006   PORTACATH PLACEMENT Right 02/28/2021   Procedure: INSERTION PORT-A-CATH;  Surgeon: Coralie Keens, MD;  Location: [facility];  Service: General;  Laterality: Right;    FAMILY HISTORY: Family History  Problem Relation Age of Onset   Diabetes Mother    Hypertension Mother    Thyroid disease Mother     Diabetes Father    Heart disease Father        4V CABG 03/2016, age 36   Hyperlipidemia Father    Hypertension Father    Hypothyroidism Father    Diabetes Sister    Hypertension Sister    Hypertension Maternal Grandmother    Colon cancer Maternal Grandmother        dx. >50   Hypertension Maternal Grandfather    Hypertension Paternal Grandmother    Kidney disease Paternal Grandmother    Hypertension Paternal Grandfather    Lung cancer Paternal Grandfather    Breast cancer Maternal Aunt        dx. 16s   Her parents are both living-- her mother at age 48 and her father at age 22 as of 01/2021. T has one brother and two sisters. She reports breast cancer in a maternal aunt in her 16's, lung cancer in her paternal grandfather (tobacco user), and colon cancer in her maternal grandmother and a maternal great-aunt (mother's aunt). Of note, one of her sisters underwent genetic testing for keloids, and results were negative.   GYNECOLOGIC HISTORY:  No LMP recorded. (Menstrual status: Irregular Periods). Menarche: 45 years old Age at first live birth: 45 years old [place] P 3 LMP 01/30/2021, irregular and heavy Contraceptive: never used (not currently active) HRT n/a  Hysterectomy? no BSO? 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 She is currently attending classes on Mon, Wed, and Thurs. She is single. She lives at home with son Otho Ket, age  47. Son Michaela Corner, age 63, is a Training and development officer in [place], [place]. Son Royal Ree Edman., age 7, is a Herbalist in [place], [place]. T has two grandchildren, a girl age 81 and a boy age 21, living in [place], [place]: not in place; will likely name her mother, Bannie Lobban, as her HCPOA. She can be reached at [place]: Social History   Tobacco Use   Smoking status: Never   Smokeless tobacco: Never  Vaping Use   Vaping Use: Never used   Substance Use Topics   Alcohol use: Yes    Alcohol/Ellison: 5.0 standard drinks    Types: 1 Glasses of wine, 1 Cans of beer, 1 Shots of liquor, 2 Standard drinks or equivalent per Ellison    Comment: 3-4 drinks per day   Drug use: No     Colonoscopy: yes, performed with [place], [place] [facility]  PAP: 06/2020  Bone density: never done (age)   Allergies  Allergen Reactions   Latex Rash    Current Outpatient Medications  Medication Sig Dispense Refill   acetaminophen (TYLENOL) 325 MG tablet Take 650 mg by mouth every 6 (six) hours as needed.     dicyclomine (BENTYL) 20 MG tablet Take 1 tablet (20 mg total) by mouth every 6 (six) hours. 30 tablet 3   DULoxetine (CYMBALTA) 30 MG capsule Take 30 mg by mouth as needed.     famotidine (PEPCID) 20 MG tablet Take 1 tablet (20 mg total) by mouth 2 (two) times daily. 60 tablet 5   gabapentin (NEURONTIN) 300 MG capsule Take 1 capsule (300 mg total) by mouth 3 (three) times daily. 90 capsule 1   ibuprofen (ADVIL) 800 MG tablet Take 800 mg by mouth.     lidocaine-prilocaine (EMLA) cream Apply to affected area once 30 g 3   LINACLOTIDE PO Take 72 mcg by mouth daily. linzess     Melatonin 3 MG TABS Take 1 tablet (3 mg total) by mouth at bedtime. (Patient taking differently: Take 3 mg by mouth at bedtime as needed.) 60 tablet 0   ondansetron (ZOFRAN) 8 MG tablet Take 1 tablet (8 mg total) by mouth 2 (two) times daily as needed (Nausea or vomiting). 30 tablet 1   oxyCODONE (OXY IR/ROXICODONE) 5 MG immediate release tablet Take 0.5-1 tablets (2.5-5 mg total) by mouth every 4 (four) hours as needed for severe pain. 30 tablet 0   pantoprazole (PROTONIX) 40 MG tablet Take 1 tablet (40 mg total) by mouth daily. 90 tablet 1   prochlorperazine (COMPAZINE) 10 MG tablet Take 1 tablet (10 mg total) by mouth every 6 (six) hours as needed (Nausea or vomiting). 30 tablet 1   TRULANCE 3 MG TABS TAKE 1 TABLET BY MOUTH DAILY 60 tablet 3   venlafaxine XR (EFFEXOR-XR) 75 MG 24 hr  capsule Take 1 capsule (75 mg total) by mouth daily with breakfast. Increase to 2 tab if tolerated after taking for 2 weeks. 90 capsule 3   No current facility-administered medications for this visit.    OBJECTIVE:   Vitals:   05/09/21 0941  BP: (!) 113/94  Pulse: 88  Resp: 18  Temp: 97.7 F (36.5 C)  SpO2: 100%     Body mass index is 27.88 kg/m.   Wt Readings from Last 3 Encounters:  05/09/21 194 lb 4.8 oz (88.1 kg)  05/03/21 188 lb 1.6 oz (85.3 kg)  04/26/21 186 lb 8 oz (84.6 kg)  ECOG FS:1 - Symptomatic but completely ambulatory GENERAL: Patient is a well appearing female in no acute distress HEENT:  Sclerae anicteric.  Oropharynx clear and moist. No ulcerations or evidence of oropharyngeal candidiasis. Neck is supple.  NODES:  No cervical, supraclavicular, or axillary lymphadenopathy palpated.  BREAST EXAM:  left breast mass no progression noted LUNGS:  Clear to auscultation bilaterally.  No wheezes or rhonchi. HEART:  Regular rate and rhythm. No murmur appreciated. ABDOMEN:  Soft, nontender.  Positive, normoactive bowel sounds. No organomegaly palpated. MSK:  No focal spinal tenderness to palpation. Full range of motion bilaterally in the upper extremities. EXTREMITIES:  No peripheral edema.   SKIN:  Clear with no obvious rashes or skin changes. No nail dyscrasia. NEURO:  Nonfocal. Well oriented.  Appropriate affect.    LAB RESULTS:  CMP     Component Value Date/Time   NA 141 05/03/2021 0848   NA 138 08/20/2016 1712   K 3.9 05/03/2021 0848   CL 107 05/03/2021 0848   CO2 24 05/03/2021 0848   GLUCOSE 91 05/03/2021 0848   BUN 16 05/03/2021 0848   BUN 11 08/20/2016 1712   CREATININE 0.83 05/03/2021 0848   CALCIUM 8.9 05/03/2021 0848   PROT 7.2 05/03/2021 0848   PROT 6.9 08/20/2016 1712   ALBUMIN 3.8 05/03/2021 0848   ALBUMIN 4.0 08/20/2016 1712   AST 16 05/03/2021 0848   ALT 14 05/03/2021 0848   ALKPHOS 93 05/03/2021 0848   BILITOT 0.5 05/03/2021 0848    GFRNONAA >60 05/03/2021 0848   GFRAA 88 08/20/2016 1712    No results found for: TOTALPROTELP, ALBUMINELP, A1GS, A2GS, BETS, BETA2SER, GAMS, MSPIKE, SPEI  Lab Results  Component Value Date   WBC 4.3 05/09/2021   NEUTROABS 1.8 05/09/2021   HGB 8.9 (L) 05/09/2021   HCT 29.0 (L) 05/09/2021   MCV 76.7 (L) 05/09/2021   PLT 422 (H) 05/09/2021     Lab Results  Component Value Date   IRON 16 (L) 06/30/2019   TIBC 385 06/30/2019   IRONPCTSAT 4 (LL) 06/30/2019   (Iron and TIBC)  Lab Results  Component Value Date   FERRITIN 18 06/30/2019    Urinalysis    Component Value Date/Time   LABSPEC >=1.030 04/22/2019 1307   PHURINE 5.5 04/22/2019 1307   GLUCOSEU NEGATIVE 04/22/2019 1307   HGBUR NEGATIVE 04/22/2019 1307   BILIRUBINUR negative 06/30/2019 1343   KETONESUR negative 06/30/2019 1343   KETONESUR NEGATIVE 04/22/2019 1307   PROTEINUR negative 06/30/2019 1343   PROTEINUR NEGATIVE 04/22/2019 1307   UROBILINOGEN 0.2 06/30/2019 1343   UROBILINOGEN 0.2 04/22/2019 1307   NITRITE Negative 06/30/2019 1343   NITRITE NEGATIVE 04/22/2019 1307   LEUKOCYTESUR Trace (A) 06/30/2019 1343   LEUKOCYTESUR NEGATIVE 04/22/2019 1307     STUDIES:    ELIGIBLE FOR AVAILABLE RESEARCH PROTOCOL: no  ASSESSMENT: 45 y.o. Catherine Ellison, Catherine Ellison status post left breast upper outer quadrant biopsy 02/07/2021 for a clinical T2 Ni, stage IIIA invasive ductal carcinoma, grade 3, estrogen receptor weakly positive, progesterone receptor and HER2 negative, with an MIB-1 of 40%.  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 Specifically, this variant is p.R247*. (A) Genetic testing identified a variant of uncertain significance (VUS) in the ATM gene called [id number]. (B) Colon Cancer Screening recommendations on MUTYH single pathogenic variant: If an individual has a first-degree  relative with colorectal cancer, screening should begin at age  70 or 10 years prior to the relatives age at diagnosis, whichever comes first and repeat colonoscopies every 5 years.  Grandmother, and aunt with FH of colon cancer.  (2) neoadjuvant chemotherapy will consist of paclitaxel weekly x12 followed by dose dense doxorubicin and cyclophosphamide x4  (3) definitive surgery to follow  (4) adjuvant radiation as appropriate  (5) antiestrogens.  PLAN:  Catherine Ellison continues on neoadjuvant weekly Taxol chemotherapy.  She has no sign of progression on her breast exam.  She is tolerating the weekly Taxol quite well and will continue this.  Her labs today are stable and I reviewed those with her in detail  We discussed her upcoming chemotherapy regimen with doxorubicin and cyclophosphamide.  She is aware that Dr. Jana Hakim is retiring and she will transition to seeing Dr. Lindi Adie.  I congratulated her on her graduation, and we discussed her tiredness and activities that can help mitigate fatigue.  We will see Catherine Ellison for labs and follow-up, and she will receive her chemotherapy the following day.  She knows to call for any questions or concerns that may arise between now and her next appointment.  Total encounter time 30 minutes.* in face-to-face visit time, chart review, lab review, order entry, and documentation of the encounter.  Wilber Bihari, NP 05/09/21 9:44 AM Medical Oncology and Hematology [facility] [place], [place] [address] Tel. [phone]    Fax. [phone]  *Total Encounter Time as defined by the Centers for Medicare and Medicaid Services includes, in addition to the face-to-face time of a patient visit (documented in the note above) non-face-to-face time: obtaining and reviewing outside history, ordering and reviewing medications, tests or procedures, care coordination (communications with other health care professionals or  caregivers) and documentation in the medical record.

## 2021-05-14 ENCOUNTER — Other Ambulatory Visit: Payer: Self-pay

## 2021-05-14 ENCOUNTER — Inpatient Hospital Stay: Payer: BC Managed Care – PPO

## 2021-05-14 ENCOUNTER — Encounter: Payer: Self-pay | Admitting: Adult Health

## 2021-05-14 ENCOUNTER — Telehealth: Payer: Self-pay | Admitting: Adult Health

## 2021-05-14 ENCOUNTER — Inpatient Hospital Stay (HOSPITAL_BASED_OUTPATIENT_CLINIC_OR_DEPARTMENT_OTHER): Payer: BC Managed Care – PPO | Admitting: Adult Health

## 2021-05-14 VITALS — BP 122/69 | HR 94 | Temp 97.8°F | Resp 16 | Ht 70.0 in | Wt 193.8 lb

## 2021-05-14 DIAGNOSIS — C50412 Malignant neoplasm of upper-outer quadrant of left female breast: Secondary | ICD-10-CM

## 2021-05-14 DIAGNOSIS — Z95828 Presence of other vascular implants and grafts: Secondary | ICD-10-CM

## 2021-05-14 DIAGNOSIS — Z170 Estrogen receptor positive status [ER+]: Secondary | ICD-10-CM

## 2021-05-14 DIAGNOSIS — Z5111 Encounter for antineoplastic chemotherapy: Secondary | ICD-10-CM | POA: Diagnosis not present

## 2021-05-14 LAB — CBC WITH DIFFERENTIAL (CANCER CENTER ONLY)
Abs Immature Granulocytes: 0.02 10*3/uL (ref 0.00–0.07)
Basophils Absolute: 0 10*3/uL (ref 0.0–0.1)
Basophils Relative: 1 %
Eosinophils Absolute: 0.1 10*3/uL (ref 0.0–0.5)
Eosinophils Relative: 1 %
HCT: 29.5 % — ABNORMAL LOW (ref 36.0–46.0)
Hemoglobin: 9.3 g/dL — ABNORMAL LOW (ref 12.0–15.0)
Immature Granulocytes: 0 %
Lymphocytes Relative: 52 %
Lymphs Abs: 2.6 10*3/uL (ref 0.7–4.0)
MCH: 24.3 pg — ABNORMAL LOW (ref 26.0–34.0)
MCHC: 31.5 g/dL (ref 30.0–36.0)
MCV: 77 fL — ABNORMAL LOW (ref 80.0–100.0)
Monocytes Absolute: 0.3 10*3/uL (ref 0.1–1.0)
Monocytes Relative: 5 %
Neutro Abs: 2.1 10*3/uL (ref 1.7–7.7)
Neutrophils Relative %: 41 %
Platelet Count: 503 10*3/uL — ABNORMAL HIGH (ref 150–400)
RBC: 3.83 MIL/uL — ABNORMAL LOW (ref 3.87–5.11)
RDW: 23.2 % — ABNORMAL HIGH (ref 11.5–15.5)
WBC Count: 5.1 10*3/uL (ref 4.0–10.5)
nRBC: 0 % (ref 0.0–0.2)

## 2021-05-14 LAB — CMP (CANCER CENTER ONLY)
ALT: 12 U/L (ref 0–44)
AST: 14 U/L — ABNORMAL LOW (ref 15–41)
Albumin: 3.9 g/dL (ref 3.5–5.0)
Alkaline Phosphatase: 85 U/L (ref 38–126)
Anion gap: 7 (ref 5–15)
BUN: 15 mg/dL (ref 6–20)
CO2: 23 mmol/L (ref 22–32)
Calcium: 9.1 mg/dL (ref 8.9–10.3)
Chloride: 106 mmol/L (ref 98–111)
Creatinine: 0.89 mg/dL (ref 0.44–1.00)
GFR, Estimated: >60 mL/min (ref >60)
Glucose, Bld: 112 mg/dL — ABNORMAL HIGH (ref 70–99)
Potassium: 4.1 mmol/L (ref 3.5–5.1)
Sodium: 136 mmol/L (ref 135–145)
Total Bilirubin: 0.3 mg/dL (ref 0.3–1.2)
Total Protein: 7 g/dL (ref 6.5–8.1)

## 2021-05-14 MED ORDER — HEPARIN SOD (PORK) LOCK FLUSH 100 UNIT/ML IV SOLN
500.0000 [IU] | Freq: Once | INTRAVENOUS | Status: AC
  Administered 2021-05-14: 15:00:00 500 [IU]

## 2021-05-14 MED ORDER — SODIUM CHLORIDE 0.9% FLUSH
10.0000 mL | Freq: Once | INTRAVENOUS | Status: AC
  Administered 2021-05-14: 15:00:00 10 mL

## 2021-05-14 NOTE — Telephone Encounter (Signed)
Scheduled appointment per 12/22 los. Unable to leave voicemail due to mailbox being full.

## 2021-05-14 NOTE — Progress Notes (Signed)
[facility] Follow up:    Catherine Mola, NP No address on file   DIAGNOSIS:  Cancer Staging  Malignant neoplasm of upper-outer quadrant of left breast in female, estrogen receptor positive ([place]) Staging form: Breast, AJCC 8th Edition - Clinical stage from 02/13/2021: Stage IIIA (cT2, cN1, cM0, G3, ER+, PR-, HER2-) - Signed by [name], MD on 02/13/2021 Stage prefix: Initial diagnosis Histologic grading system: 3 grade system Laterality: Left Staged by: Pathologist and managing physician Stage used in treatment planning: Yes National guidelines used in treatment planning: Yes Type of national guideline used in treatment planning: NCCN   SUMMARY OF ONCOLOGIC HISTORY: Oncology History  Malignant neoplasm of upper-outer quadrant of left breast in female, estrogen receptor positive ([place])  02/11/2021 Initial Diagnosis   Malignant neoplasm of upper-outer quadrant of left breast in female, estrogen receptor positive ([employer])   02/13/2021 Cancer Staging   Staging form: Breast, AJCC 8th Edition - Clinical stage from 02/13/2021: Stage IIIA (cT2, cN1, cM0, G3, ER+, PR-, HER2-) - Signed by [name], MD on 02/13/2021 Stage prefix: Initial diagnosis Histologic grading system: 3 grade system Laterality: Left Staged by: Pathologist and managing physician Stage used in treatment planning: Yes National guidelines used in treatment planning: Yes Type of national guideline used in treatment planning: NCCN    02/19/2021 Genetic Testing   [facility] [employer] (39 genes) 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 The report date is 02/26/2021.   The CustomNext-Cancer+RNAinsight panel offered by [facility] includes sequencing and rearrangement analysis for the following 47 genes:  APC, ATM, AXIN2, BARD1, BMPR1A, BRCA1,  BRCA2, BRIP1, CDH1, CDK4, CDKN2A, CHEK2, DICER1, EPCAM, GREM1, HOXB13, MEN1, MLH1, MSH2, MSH3, MSH6, MUTYH, NBN, NF1, NF2, NTHL1, PALB2, PMS2, POLD1, POLE, PTEN, RAD51C, RAD51D, RECQL, RET, SDHA, SDHAF2, SDHB, SDHC, SDHD, SMAD4, SMARCA4, STK11, TP53, TSC1, TSC2, and VHL.  RNA data is routinely analyzed for use in variant interpretation for all genes.   03/01/2021 -  Chemotherapy   Patient is on Treatment Plan : BREAST PACLitaxel 7d / Adjuvant Dose Dense AC q14d       CURRENT THERAPY: Weekly Taxol  INTERVAL HISTORY: Catherine Ellison 45 y.o. female returns for evaluation prior to receiving her 11th cycle of weekly chemotherapy with Taxol.  She notes that she continues to tolerate this well.  She denies any peripheral neuropathy at this point.  She tells me that she does not know what to do with her day-to-day life since she has graduated from nursing school.  Catherine Ellison does have some hot flashes.  She is on Effexor 75 mg a day.  She is also on gabapentin 300 to 600 mg 3 times daily.  She also notes some aches and pains that occur on day 3 following her weekly Taxol.  She does admit that perhaps her hot flashes worsened when she missed a couple days of the Effexor.   Patient Active Problem List   Diagnosis Date Noted   Port-A-Cath in place 26/20/3559   Monoallelic mutation of MUTYH gene 03/04/2021   Genetic testing 02/20/2021   Family history of breast cancer 02/13/2021   Family history of colon cancer 02/13/2021   Malignant neoplasm of upper-outer quadrant of left breast in female, estrogen receptor positive ([place]) 02/11/2021   Cervical cancer screening 06/30/2019   Menorrhagia with regular cycle 06/30/2019   Vaginal bleeding  06/30/2019   Adjustment disorder with mixed anxiety and depressed mood 11/06/2016    is allergic to latex.  MEDICAL HISTORY: Past Medical History:  Diagnosis Date   Allergy    seasonal    Anemia    Complication of anesthesia    bp dropped with 1st ovary  surgery, no problems since with surgery   Dyspnea    with heavy exertion   GERD (gastroesophageal reflux disease)    History of gestational diabetes 2005   Hyperemesis gravidarum    with all pregnancies   IBS (irritable bowel syndrome)    left breast cancer    Peptic ulcer    Pneumonia     SURGICAL HISTORY: Past Surgical History:  Procedure Laterality Date   BILATERAL SALPINGECTOMY Bilateral 2014   emergency   LEFT OOPHORECTOMY Left 2006   PORTACATH PLACEMENT Right 02/28/2021   Procedure: INSERTION PORT-A-CATH;  Surgeon: Coralie Keens, MD;  Location: [facility];  Service: General;  Laterality: Right;    SOCIAL HISTORY: Social History   Socioeconomic History   Marital status: Divorced    Spouse name: Not on file   Number of children: 3   Years of education: Not on file   Highest education level: Not on file  Occupational History   Occupation: nurse    Comment: mental health  Tobacco Use   Smoking status: Never   Smokeless tobacco: Never  Vaping Use   Vaping Use: Never used  Substance and Sexual Activity   Alcohol use: Yes    Alcohol/week: 5.0 standard drinks    Types: 1 Glasses of wine, 1 Cans of beer, 1 Shots of liquor, 2 Standard drinks or equivalent per week    Comment: 3-4 drinks per day   Drug use: No   Sexual activity: Not on file  Other Topics Concern   Not on file  Social History Narrative   Lives with her youngest child.   Social Determinants of Health   Financial Resource Strain: Medium Risk   Difficulty of Paying Living Expenses: Somewhat hard  Food Insecurity: No Food Insecurity   Worried About Charity fundraiser in the Last Year: Never true   Ran Out of Food in the Last Year: Never true  Transportation Needs: No Transportation Needs   Lack of Transportation (Medical): No   Lack of Transportation (Non-Medical): No  Physical Activity: Not on file  Stress: Not on file  Social Connections: Not on file  Intimate Partner  Violence: Not on file    FAMILY HISTORY: Family History  Problem Relation Age of Onset   Diabetes Mother    Hypertension Mother    Thyroid disease Mother    Diabetes Father    Heart disease Father        4V CABG 03/2016, age 34   Hyperlipidemia Father    Hypertension Father    Hypothyroidism Father    Diabetes Sister    Hypertension Sister    Hypertension Maternal Grandmother    Colon cancer Maternal Grandmother        dx. >50   Hypertension Maternal Grandfather    Hypertension Paternal Grandmother    Kidney disease Paternal Grandmother    Hypertension Paternal Grandfather    Lung cancer Paternal Grandfather    Breast cancer Maternal Aunt        dx. 40s    Review of Systems  Constitutional:  Positive for fatigue. Negative for appetite change, chills, fever and unexpected weight change.  HENT:  Negative for hearing loss, lump/mass and trouble swallowing.   Eyes:  Negative for eye problems and icterus.  Respiratory:  Negative for chest tightness, cough and shortness of breath.   Cardiovascular:  Negative for chest pain, leg swelling and palpitations.  Gastrointestinal:  Negative for abdominal distention, abdominal pain, constipation, diarrhea, nausea and vomiting.  Endocrine: Positive for hot flashes.  Genitourinary:  Negative for difficulty urinating.   Musculoskeletal:  Negative for arthralgias.  Skin:  Negative for itching and rash.  Neurological:  Negative for dizziness, extremity weakness, headaches and numbness.  Hematological:  Negative for adenopathy. Does not bruise/bleed easily.  Psychiatric/Behavioral:  Negative for depression. The patient is not nervous/anxious.      PHYSICAL EXAMINATION  ECOG PERFORMANCE STATUS: 1 - Symptomatic but completely ambulatory  Vitals:   05/14/21 1445  BP: 122/69  Pulse: 94  Resp: 16  Temp: 97.8 F (36.6 C)  SpO2: 100%    Physical Exam Constitutional:      General: She is not in acute distress.    Appearance: Normal  appearance. She is not toxic-appearing.  HENT:     Head: Normocephalic and atraumatic.  Eyes:     General: No scleral icterus. Cardiovascular:     Rate and Rhythm: Normal rate and regular rhythm.     Pulses: Normal pulses.     Heart sounds: Normal heart sounds.  Pulmonary:     Effort: Pulmonary effort is normal.     Breath sounds: Normal breath sounds.  Abdominal:     General: Abdomen is flat. Bowel sounds are normal. There is no distension.     Palpations: Abdomen is soft.     Tenderness: There is no abdominal tenderness.  Musculoskeletal:        General: No swelling.     Cervical back: Neck supple.  Lymphadenopathy:     Cervical: No cervical adenopathy.  Skin:    General: Skin is warm and dry.     Findings: No rash.  Neurological:     General: No focal deficit present.     Mental Status: She is alert.  Psychiatric:        Mood and Affect: Mood normal.        Behavior: Behavior normal.    LABORATORY DATA:  CBC    Component Value Date/Time   WBC 5.1 05/14/2021 1429   RBC 3.83 (L) 05/14/2021 1429   HGB 9.3 (L) 05/14/2021 1429   HGB 9.5 (L) 06/30/2019 1551   HCT 29.5 (L) 05/14/2021 1429   HCT 30.8 (L) 06/30/2019 1551   PLT 503 (H) 05/14/2021 1429   PLT 389 06/30/2019 1551   MCV 77.0 (L) 05/14/2021 1429   MCV 72 (L) 06/30/2019 1551   MCH 24.3 (L) 05/14/2021 1429   MCHC 31.5 05/14/2021 1429   RDW 23.2 (H) 05/14/2021 1429   RDW 16.8 (H) 06/30/2019 1551   LYMPHSABS 2.6 05/14/2021 1429   LYMPHSABS 3.4 (H) 06/30/2019 1551   MONOABS 0.3 05/14/2021 1429   EOSABS 0.1 05/14/2021 1429   EOSABS 0.1 06/30/2019 1551   BASOSABS 0.0 05/14/2021 1429   BASOSABS 0.1 06/30/2019 1551    CMP     Component Value Date/Time   NA 136 05/14/2021 1429   NA 138 08/20/2016 1712   K 4.1 05/14/2021 1429   CL 106 05/14/2021 1429   CO2 23 05/14/2021 1429   GLUCOSE 112 (H) 05/14/2021 1429   BUN 15 05/14/2021 1429   BUN 11 08/20/2016 1712   CREATININE 0.89 05/14/2021 1429  CALCIUM  9.1 05/14/2021 1429   PROT 7.0 05/14/2021 1429   PROT 6.9 08/20/2016 1712   ALBUMIN 3.9 05/14/2021 1429   ALBUMIN 4.0 08/20/2016 1712   AST 14 (L) 05/14/2021 1429   ALT 12 05/14/2021 1429   ALKPHOS 85 05/14/2021 1429   BILITOT 0.3 05/14/2021 1429   GFRNONAA >60 05/14/2021 1429   GFRAA 88 08/20/2016 1712         ASSESSMENT and THERAPY PLAN:   Malignant neoplasm of upper-outer quadrant of left breast in female, estrogen receptor positive ([place]) Catherine Ellison is a 45 year old woman who is here today for evaluation prior to receiving neoadjuvant chemotherapy for her stage IIIa estrogen positive breast cancer.  1.  Stage IIIa estrogen positive breast cancer.  She continues on neoadjuvant chemotherapy with weekly Taxol.  She will proceed with week 11 of treatment tomorrow.  Her labs thus far are stable.  Once she completes 12 weekly Taxol chemotherapy treatments, she will proceed onto every 2-week doxorubicin and cyclophosphamide.  2. Hot flashes: She is on Effexor and gabapentin.  The next medication we could add to help alleviate hot flashes is clonidine.  Considering the fact that she has just noticing that these have worsened and she also missed a couple doses of her Effexor we will hold on any medication changes at the moment.  We discussed her overall treatment plan and how she will transition care from Dr. Jana Hakim to Dr. Lindi Adie.  After tomorrow and receiving her treatment she will return the following week for labs, follow-up, and her 12th weekly paclitaxel treatment.   All questions were answered. The patient knows to call the clinic with any problems, questions or concerns. We can certainly see the patient much sooner if necessary.  Total encounter time: 30 minutes in face-to-face visit time, chart review, lab review, care coordination, and documentation of the encounter.  Wilber Bihari, NP 05/14/21 7:15 PM Medical Oncology and Hematology [facility] [place], [place] [address] Tel. [phone]    Fax. [phone]  *Total Encounter Time as defined by the Centers for Medicare and Medicaid Services includes, in addition to the face-to-face time of a patient visit (documented in the note above) non-face-to-face time: obtaining and reviewing outside history, ordering and reviewing medications, tests or procedures, care coordination (communications with other health care professionals or caregivers) and documentation in the medical record.

## 2021-05-14 NOTE — Assessment & Plan Note (Signed)
Catherine Ellison is a 45 year old woman who is here today for evaluation prior to receiving neoadjuvant chemotherapy for her stage IIIa estrogen positive breast cancer.  1.  Stage IIIa estrogen positive breast cancer.  She continues on neoadjuvant chemotherapy with weekly Taxol.  She will proceed with week 11 of treatment tomorrow.  Her labs thus far are stable.  Once she completes 12 weekly Taxol chemotherapy treatments, she will proceed onto every 2-week doxorubicin and cyclophosphamide.  2. Hot flashes: She is on Effexor and gabapentin.  The next medication we could add to help alleviate hot flashes is clonidine.  Considering the fact that she has just noticing that these have worsened and she also missed a couple doses of her Effexor we will hold on any medication changes at the moment.  We discussed her overall treatment plan and how she will transition care from Dr. Jana Hakim to Dr. Lindi Adie.  After tomorrow and receiving her treatment she will return the following week for labs, follow-up, and her 12th weekly paclitaxel treatment.

## 2021-05-15 ENCOUNTER — Inpatient Hospital Stay: Payer: BC Managed Care – PPO

## 2021-05-15 VITALS — BP 142/85 | HR 98 | Temp 98.1°F | Resp 18

## 2021-05-15 DIAGNOSIS — Z170 Estrogen receptor positive status [ER+]: Secondary | ICD-10-CM

## 2021-05-15 DIAGNOSIS — Z5111 Encounter for antineoplastic chemotherapy: Secondary | ICD-10-CM | POA: Diagnosis not present

## 2021-05-15 MED ORDER — SODIUM CHLORIDE 0.9 % IV SOLN: Freq: Once | INTRAVENOUS | Status: AC

## 2021-05-15 MED ORDER — FAMOTIDINE 20 MG IN NS 100 ML IVPB
20.0000 mg | Freq: Once | INTRAVENOUS | Status: AC
  Administered 2021-05-15: 11:00:00 20 mg via INTRAVENOUS
  Filled 2021-05-15: qty 100

## 2021-05-15 MED ORDER — DIPHENHYDRAMINE HCL 50 MG/ML IJ SOLN
25.0000 mg | Freq: Once | INTRAMUSCULAR | Status: AC
  Administered 2021-05-15: 10:00:00 25 mg via INTRAVENOUS
  Filled 2021-05-15: qty 1

## 2021-05-15 MED ORDER — SODIUM CHLORIDE 0.9 % IV SOLN
10.0000 mg | Freq: Once | INTRAVENOUS | Status: AC
  Administered 2021-05-15: 10:00:00 10 mg via INTRAVENOUS
  Filled 2021-05-15: qty 10

## 2021-05-15 MED ORDER — SODIUM CHLORIDE 0.9 % IV SOLN
80.0000 mg/m2 | Freq: Once | INTRAVENOUS | Status: AC
  Administered 2021-05-15: 12:00:00 168 mg via INTRAVENOUS
  Filled 2021-05-15: qty 28

## 2021-05-15 NOTE — Patient Instructions (Signed)
[facility] MEDICAL [facility]COLOG[facility]   Discharge Instructions: Thank you for choosing [facility] to provide your oncology and hematology care.   If you have a lab appointment with the Cancer Center, please go directly to the Cancer Center and check in at the registration area.   Wear comfortable clothing and clothing appropriate for easy access to any Portacath or PICC line.   We strive to give you quality time with your provider. You may need to reschedule your appointment if you arrive late (15 or more minutes).  Arriving late affects you and other patients whose appointments are after yours.  Also, if you miss three or more appointments without notifying the office, you may be dismissed from the clinic at the provider's discretion.      For prescription refill requests, have your pharmacy contact our office and allow 72 hours for refills to be completed.    Today you received the following chemotherapy and/or immunotherapy agents: paclitaxel.      To help prevent nausea and vomiting after your treatment, we encourage you to take your nausea medication as directed.  BELOW ARE SYMPTOMS THAT SHOULD BE REPORTED IMMEDIATELY: *FEVER GREATER THAN 100.4 F (38 C) OR HIGHER *CHILLS OR SWEATING *NAUSEA AND VOMITING THAT IS NOT CONTROLLED WITH YOUR NAUSEA MEDICATION *UNUSUAL SHORTNESS OF BREATH *UNUSUAL BRUISING OR BLEEDING *URINARY PROBLEMS (pain or burning when urinating, or frequent urination) *BOWEL PROBLEMS (unusual diarrhea, constipation, pain near the anus) TENDERNESS IN MOUTH AND THROAT WITH OR WITHOUT PRESENCE OF ULCERS (sore throat, sores in mouth, or a toothache) UNUSUAL RASH, SWELLING OR PAIN  UNUSUAL VAGINAL DISCHARGE OR ITCHING   Items with * indicate a potential emergency and should be followed up as soon as possible or go to the Emergency Department if any problems should occur.  Please show the CHEMOTHERAPY ALERT CARD or IMMUNOTHERAPY ALERT CARD at check-in  to the Emergency Department and triage nurse.  Should you have questions after your visit or need to cancel or reschedule your appointment, please contact [facility]ICAL [facility]  Dept: [phone]  and follow the prompts.  Office hours are 8:00 a.m. to 4:30 p.m. Monday - Friday. Please note that voicemails left after 4:00 p.m. may not be returned until the following business day.  We are closed weekends and major holidays. You have access to a nurse at all times for urgent questions. Please call the main number to the clinic Dept: [phone] and follow the prompts.   For any non-urgent questions, you may also contact your provider using MyChart. We now offer e-Visits for anyone 18 and older to request care online for non-urgent symptoms. For details visit [email or web address].   Also download the MyChart app! Go to the app store, search "MyChart", open the app, select [facility], and log in with your MyChart username and password.  Due to Covid, a mask is required upon entering the hospital/clinic. If you do not have a mask, one will be given to you upon arrival. For doctor visits, patients may have 1 support person aged 18 or older with them. For treatment visits, patients cannot have anyone with them due to current Covid guidelines and our immunocompromised population.   

## 2021-05-21 ENCOUNTER — Other Ambulatory Visit: Payer: Self-pay

## 2021-05-21 DIAGNOSIS — C50412 Malignant neoplasm of upper-outer quadrant of left female breast: Secondary | ICD-10-CM

## 2021-05-21 DIAGNOSIS — Z170 Estrogen receptor positive status [ER+]: Secondary | ICD-10-CM

## 2021-05-22 ENCOUNTER — Other Ambulatory Visit: Payer: Self-pay

## 2021-05-22 ENCOUNTER — Encounter: Payer: Self-pay | Admitting: Oncology

## 2021-05-22 ENCOUNTER — Inpatient Hospital Stay: Payer: BC Managed Care – PPO

## 2021-05-22 ENCOUNTER — Inpatient Hospital Stay: Payer: BC Managed Care – PPO | Attending: Oncology | Admitting: Adult Health

## 2021-05-22 ENCOUNTER — Encounter: Payer: Self-pay | Admitting: Adult Health

## 2021-05-22 VITALS — BP 108/78 | HR 89 | Temp 97.0°F | Resp 16 | Ht 70.0 in | Wt 193.5 lb

## 2021-05-22 DIAGNOSIS — C50412 Malignant neoplasm of upper-outer quadrant of left female breast: Secondary | ICD-10-CM

## 2021-05-22 DIAGNOSIS — D6481 Anemia due to antineoplastic chemotherapy: Secondary | ICD-10-CM | POA: Diagnosis not present

## 2021-05-22 DIAGNOSIS — Z5111 Encounter for antineoplastic chemotherapy: Secondary | ICD-10-CM | POA: Insufficient documentation

## 2021-05-22 DIAGNOSIS — Z170 Estrogen receptor positive status [ER+]: Secondary | ICD-10-CM | POA: Diagnosis not present

## 2021-05-22 DIAGNOSIS — Z95828 Presence of other vascular implants and grafts: Secondary | ICD-10-CM

## 2021-05-22 LAB — CMP (CANCER CENTER ONLY)
ALT: 14 U/L (ref 0–44)
AST: 16 U/L (ref 15–41)
Albumin: 3.8 g/dL (ref 3.5–5.0)
Alkaline Phosphatase: 91 U/L (ref 38–126)
Anion gap: 6 (ref 5–15)
BUN: 13 mg/dL (ref 6–20)
CO2: 26 mmol/L (ref 22–32)
Calcium: 9.1 mg/dL (ref 8.9–10.3)
Chloride: 105 mmol/L (ref 98–111)
Creatinine: 0.77 mg/dL (ref 0.44–1.00)
GFR, Estimated: >60 mL/min (ref >60)
Glucose, Bld: 94 mg/dL (ref 70–99)
Potassium: 3.7 mmol/L (ref 3.5–5.1)
Sodium: 137 mmol/L (ref 135–145)
Total Bilirubin: 0.4 mg/dL (ref 0.3–1.2)
Total Protein: 6.9 g/dL (ref 6.5–8.1)

## 2021-05-22 LAB — CBC WITH DIFFERENTIAL (CANCER CENTER ONLY)
Abs Immature Granulocytes: 0.02 10*3/uL (ref 0.00–0.07)
Basophils Absolute: 0 10*3/uL (ref 0.0–0.1)
Basophils Relative: 1 %
Eosinophils Absolute: 0.1 10*3/uL (ref 0.0–0.5)
Eosinophils Relative: 1 %
HCT: 29 % — ABNORMAL LOW (ref 36.0–46.0)
Hemoglobin: 9.2 g/dL — ABNORMAL LOW (ref 12.0–15.0)
Immature Granulocytes: 1 %
Lymphocytes Relative: 45 %
Lymphs Abs: 1.9 10*3/uL (ref 0.7–4.0)
MCH: 24.8 pg — ABNORMAL LOW (ref 26.0–34.0)
MCHC: 31.7 g/dL (ref 30.0–36.0)
MCV: 78.2 fL — ABNORMAL LOW (ref 80.0–100.0)
Monocytes Absolute: 0.4 10*3/uL (ref 0.1–1.0)
Monocytes Relative: 8 %
Neutro Abs: 1.9 10*3/uL (ref 1.7–7.7)
Neutrophils Relative %: 44 %
Platelet Count: 380 10*3/uL (ref 150–400)
RBC: 3.71 MIL/uL — ABNORMAL LOW (ref 3.87–5.11)
RDW: 23.9 % — ABNORMAL HIGH (ref 11.5–15.5)
WBC Count: 4.3 10*3/uL (ref 4.0–10.5)
nRBC: 0 % (ref 0.0–0.2)

## 2021-05-22 MED ORDER — HEPARIN SOD (PORK) LOCK FLUSH 100 UNIT/ML IV SOLN
500.0000 [IU] | Freq: Once | INTRAVENOUS | Status: AC
  Administered 2021-05-22: 500 [IU]

## 2021-05-22 MED ORDER — OXYCODONE HCL 5 MG PO TABS: 2.5000 mg | ORAL_TABLET | ORAL | 0 refills | Status: DC | PRN

## 2021-05-22 MED ORDER — SODIUM CHLORIDE 0.9% FLUSH
10.0000 mL | Freq: Once | INTRAVENOUS | Status: AC
  Administered 2021-05-22: 10 mL

## 2021-05-22 MED FILL — Dexamethasone Sodium Phosphate Inj 100 MG/10ML: INTRAMUSCULAR | Qty: 1 | Status: AC

## 2021-05-22 NOTE — Progress Notes (Signed)
[facility] [place] [place] Follow up:    Catherine Mola, NP No address on file   DIAGNOSIS:  Cancer Staging  Malignant neoplasm of upper-outer quadrant of left breast in female, estrogen receptor positive ([place]) Staging form: Breast, AJCC 8th Edition - Clinical stage from 02/13/2021: Stage IIIA (cT2, cN1, cM0, G3, ER+, PR-, HER2-) - Signed by [name], MD on 02/13/2021 Stage prefix: Initial diagnosis Histologic grading system: 3 grade system Laterality: Left Staged by: Pathologist and managing physician Stage used in treatment planning: Yes National guidelines used in treatment planning: Yes Type of national guideline used in treatment planning: NCCN   SUMMARY OF ONCOLOGIC HISTORY: Oncology History  Malignant neoplasm of upper-outer quadrant of left breast in female, estrogen receptor positive ([place])  02/11/2021 Initial Diagnosis   Malignant neoplasm of upper-outer quadrant of left breast in female, estrogen receptor positive ([place])   02/13/2021 Cancer Staging   Staging form: Breast, AJCC 8th Edition - Clinical stage from 02/13/2021: Stage IIIA (cT2, cN1, cM0, G3, ER+, PR-, HER2-) - Signed by [name], MD on 02/13/2021 Stage prefix: Initial diagnosis Histologic grading system: 3 grade system Laterality: Left Staged by: Pathologist and managing physician Stage used in treatment planning: Yes National guidelines used in treatment planning: Yes Type of national guideline used in treatment planning: NCCN    02/19/2021 Genetic Testing   [facility] [employer] (60 genes) 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 The report date is 02/26/2021.   The CustomNext-Cancer+RNAinsight panel offered by [facility] includes sequencing and rearrangement analysis for the following 47 genes:  APC, ATM, AXIN2, BARD1, BMPR1A, BRCA1,  BRCA2, BRIP1, CDH1, CDK4, CDKN2A, CHEK2, DICER1, EPCAM, GREM1, HOXB13, MEN1, MLH1, MSH2, MSH3, MSH6, MUTYH, NBN, NF1, NF2, NTHL1, PALB2, PMS2, POLD1, POLE, PTEN, RAD51C, RAD51D, RECQL, RET, SDHA, SDHAF2, SDHB, SDHC, SDHD, SMAD4, SMARCA4, STK11, TP53, TSC1, TSC2, and VHL.  RNA data is routinely analyzed for use in variant interpretation for all genes.   03/01/2021 -  Chemotherapy   Patient is on Treatment Plan : BREAST PACLitaxel 7d / Adjuvant Dose Dense AC q14d       CURRENT THERAPY: Weekly Taxol No. 12  INTERVAL HISTORY: Catherine Ellison 46 y.o. female returns for evaluation prior to her 27 out of 12 weekly Taxol treatments.  Catherine Ellison is receiving these neoadjuvantly.  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  Tomorrow Catherine Ellison will receive #12 of her Taxol.  Catherine Ellison notes that her main issue is joint aches and pains.  Catherine Ellison notes that Catherine Ellison will take oxycodone for this only when her pain is severe.  Catherine Ellison has ibuprofen to take as well.  Catherine Ellison denies any peripheral neuropathy.  However Catherine Ellison does note some nail dyscrasia with brittleness of her nails and some pain at the base of her nailbeds on the first 3 digits bilaterally.  Otherwise Catherine Ellison continues to do well and have a positive attitude.  Catherine Ellison notes that her left breast cancer continues to improve tremendously.   Patient Active Problem List   Diagnosis Date Noted   Port-A-Cath in place 91/69/4503   Monoallelic mutation of MUTYH gene 03/04/2021   Genetic testing 02/20/2021   Family history of breast cancer 02/13/2021   Family history of colon cancer 02/13/2021  Malignant neoplasm of upper-outer quadrant of left breast in female, estrogen receptor positive ([place]) 02/11/2021   Cervical cancer screening 06/30/2019   Menorrhagia with regular cycle 06/30/2019   Vaginal bleeding 06/30/2019   Adjustment disorder with mixed anxiety and  depressed mood 11/06/2016    is allergic to latex.  MEDICAL HISTORY: Past Medical History:  Diagnosis Date   Allergy    seasonal    Anemia    Complication of anesthesia    bp dropped with 1st ovary surgery, no problems since with surgery   Dyspnea    with heavy exertion   GERD (gastroesophageal reflux disease)    History of gestational diabetes 2005   Hyperemesis gravidarum    with all pregnancies   IBS (irritable bowel syndrome)    left breast cancer    Peptic ulcer    Pneumonia     SURGICAL HISTORY: Past Surgical History:  Procedure Laterality Date   BILATERAL SALPINGECTOMY Bilateral 2014   emergency   LEFT OOPHORECTOMY Left 2006   PORTACATH PLACEMENT Right 02/28/2021   Procedure: INSERTION PORT-A-CATH;  Surgeon: Coralie Keens, MD;  Location: [facility];  Service: General;  Laterality: Right;    SOCIAL HISTORY: Social History   Socioeconomic History   Marital status: Divorced    Spouse name: Not on file   Number of children: 3   Years of education: Not on file   Highest education level: Not on file  Occupational History   Occupation: nurse    Comment: mental health  Tobacco Use   Smoking status: Never   Smokeless tobacco: Never  Vaping Use   Vaping Use: Never used  Substance and Sexual Activity   Alcohol use: Yes    Alcohol/week: 5.0 standard drinks    Types: 1 Glasses of wine, 1 Cans of beer, 1 Shots of liquor, 2 Standard drinks or equivalent per week    Comment: 3-4 drinks per day   Drug use: No   Sexual activity: Not on file  Other Topics Concern   Not on file  Social History Narrative   Lives with her youngest child.   Social Determinants of Health   Financial Resource Strain: Medium Risk   Difficulty of Paying Living Expenses: Somewhat hard  Food Insecurity: No Food Insecurity   Worried About Charity fundraiser in the Last Year: Never true   Ran Out of Food in the Last Year: Never true  Transportation Needs: No  Transportation Needs   Lack of Transportation (Medical): No   Lack of Transportation (Non-Medical): No  Physical Activity: Not on file  Stress: Not on file  Social Connections: Not on file  Intimate Partner Violence: Not on file    FAMILY HISTORY: Family History  Problem Relation Age of Onset   Diabetes Mother    Hypertension Mother    Thyroid disease Mother    Diabetes Father    Heart disease Father        4V CABG 03/2016, age 62   Hyperlipidemia Father    Hypertension Father    Hypothyroidism Father    Diabetes Sister    Hypertension Sister    Hypertension Maternal Grandmother    Colon cancer Maternal Grandmother        dx. >50   Hypertension Maternal Grandfather    Hypertension Paternal Grandmother    Kidney disease Paternal Grandmother    Hypertension Paternal Grandfather    Lung cancer Paternal Grandfather    Breast cancer Maternal Aunt  dx. 40s    Review of Systems  Constitutional:  Positive for fatigue. Negative for appetite change, chills, fever and unexpected weight change.  HENT:   Negative for hearing loss, lump/mass and trouble swallowing.   Eyes:  Negative for eye problems and icterus.  Respiratory:  Negative for chest tightness, cough and shortness of breath.   Cardiovascular:  Negative for chest pain, leg swelling and palpitations.  Gastrointestinal:  Negative for abdominal distention, abdominal pain, constipation, diarrhea, nausea and vomiting.  Endocrine: Negative for hot flashes.  Genitourinary:  Negative for difficulty urinating.   Musculoskeletal:  Positive for arthralgias.  Skin:  Negative for itching and rash.  Neurological:  Negative for dizziness, extremity weakness, headaches and numbness.  Hematological:  Negative for adenopathy. Does not bruise/bleed easily.  Psychiatric/Behavioral:  Negative for depression. The patient is not nervous/anxious.      PHYSICAL EXAMINATION  ECOG PERFORMANCE STATUS: 2 - Symptomatic, <50% confined to  bed  Vitals:   05/22/21 1119  BP: 108/78  Pulse: 89  Resp: 16  Temp: (!) 97 F (36.1 C)  SpO2: 100%    Physical Exam Constitutional:      General: Catherine Ellison is not in acute distress.    Appearance: Normal appearance. Catherine Ellison is not toxic-appearing.  HENT:     Head: Normocephalic and atraumatic.  Eyes:     General: No scleral icterus. Cardiovascular:     Rate and Rhythm: Normal rate and regular rhythm.     Pulses: Normal pulses.     Heart sounds: Normal heart sounds.  Pulmonary:     Effort: Pulmonary effort is normal.     Breath sounds: Normal breath sounds.     Comments: No progression and left-sided breast cancer it is more difficult to palpate her tumor today. Abdominal:     General: Abdomen is flat. Bowel sounds are normal. There is no distension.     Palpations: Abdomen is soft.     Tenderness: There is no abdominal tenderness.  Musculoskeletal:        General: No swelling.     Cervical back: Neck supple.  Lymphadenopathy:     Cervical: No cervical adenopathy.  Skin:    General: Skin is warm and dry.     Findings: No rash.  Neurological:     General: No focal deficit present.     Mental Status: Catherine Ellison is alert.  Psychiatric:        Mood and Affect: Mood normal.        Behavior: Behavior normal.    LABORATORY DATA:  CBC    Component Value Date/Time   WBC 4.3 05/22/2021 1038   RBC 3.71 (L) 05/22/2021 1038   HGB 9.2 (L) 05/22/2021 1038   HGB 9.5 (L) 06/30/2019 1551   HCT 29.0 (L) 05/22/2021 1038   HCT 30.8 (L) 06/30/2019 1551   PLT 380 05/22/2021 1038   PLT 389 06/30/2019 1551   MCV 78.2 (L) 05/22/2021 1038   MCV 72 (L) 06/30/2019 1551   MCH 24.8 (L) 05/22/2021 1038   MCHC 31.7 05/22/2021 1038   RDW 23.9 (H) 05/22/2021 1038   RDW 16.8 (H) 06/30/2019 1551   LYMPHSABS 1.9 05/22/2021 1038   LYMPHSABS 3.4 (H) 06/30/2019 1551   MONOABS 0.4 05/22/2021 1038   EOSABS 0.1 05/22/2021 1038   EOSABS 0.1 06/30/2019 1551   BASOSABS 0.0 05/22/2021 1038   BASOSABS 0.1  06/30/2019 1551    CMP     Component Value Date/Time   NA 137 05/22/2021 1038  NA 138 08/20/2016 1712   K 3.7 05/22/2021 1038   CL 105 05/22/2021 1038   CO2 26 05/22/2021 1038   GLUCOSE 94 05/22/2021 1038   BUN 13 05/22/2021 1038   BUN 11 08/20/2016 1712   CREATININE 0.77 05/22/2021 1038   CALCIUM 9.1 05/22/2021 1038   PROT 6.9 05/22/2021 1038   PROT 6.9 08/20/2016 1712   ALBUMIN 3.8 05/22/2021 1038   ALBUMIN 4.0 08/20/2016 1712   AST 16 05/22/2021 1038   ALT 14 05/22/2021 1038   ALKPHOS 91 05/22/2021 1038   BILITOT 0.4 05/22/2021 1038   GFRNONAA >60 05/22/2021 1038   GFRAA 88 08/20/2016 1712       ASSESSMENT and THERAPY PLAN:   Malignant neoplasm of upper-outer quadrant of left breast in female, estrogen receptor positive ([place]) Catherine Ellison is a 46 year old woman who is here today for evaluation prior to receiving neoadjuvant chemotherapy for her stage IIIa estrogen positive breast cancer.  1.  Stage IIIa estrogen positive breast cancer.  Catherine Ellison continues on neoadjuvant chemotherapy with weekly Taxol.  Catherine Ellison will proceed with week 12 of treatment tomorrow.  Her labs thus far are stable.  Once Catherine Ellison completes 12 weekly Taxol chemotherapy treatments, Catherine Ellison will proceed onto every 2-week doxorubicin and cyclophosphamide.  2. Hot flashes: Catherine Ellison is on Effexor and gabapentin.  The next medication we could add to help alleviate hot flashes is clonidine.  Considering the fact that Catherine Ellison has just noticing that these have worsened and Catherine Ellison also missed a couple doses of her Effexor we will hold on any medication changes at the moment.  3.  Arthralgias and myalgias: I refilled her oxycodone today #30, PMP aware reviewed.  Catherine Ellison will use this when Catherine Ellison absolutely needs it and ibuprofen at other times.  We also dose reduced her Taxol.   I reviewed the above with Dr. Lindi Adie who is in agreement.  We will see Catherine Ellison back in 1 week for labs, follow-up, cycle 1 of Adriamycin and Cytoxan.   All questions were  answered. The patient knows to call the clinic with any problems, questions or concerns. We can certainly see the patient much sooner if necessary.  Total encounter time: 30 minutes in face-to-face visit time, chart review, lab review, order entry, care coordination, discussion with other providers, and documentation of the encounter.  Wilber Bihari, NP 05/22/21 12:06 PM Medical Oncology and Hematology [facility] [place], [place] [address] Tel. [phone]    Fax. [phone]  *Total Encounter Time as defined by the Centers for Medicare and Medicaid Services includes, in addition to the face-to-face time of a patient visit (documented in the note above) non-face-to-face time: obtaining and reviewing outside history, ordering and reviewing medications, tests or procedures, care coordination (communications with other health care professionals or caregivers) and documentation in the medical record.

## 2021-05-22 NOTE — Assessment & Plan Note (Signed)
Catherine Ellison is a 46 year old woman who is here today for evaluation prior to receiving neoadjuvant chemotherapy for her stage IIIa estrogen positive breast cancer.  1.  Stage IIIa estrogen positive breast cancer.  She continues on neoadjuvant chemotherapy with weekly Taxol.  She will proceed with week 12 of treatment tomorrow.  Her labs thus far are stable.  Once she completes 12 weekly Taxol chemotherapy treatments, she will proceed onto every 2-week doxorubicin and cyclophosphamide.  2. Hot flashes: She is on Effexor and gabapentin.  The next medication we could add to help alleviate hot flashes is clonidine.  Considering the fact that she has just noticing that these have worsened and she also missed a couple doses of her Effexor we will hold on any medication changes at the moment.  3.  Arthralgias and myalgias: I refilled her oxycodone today #30, PMP aware reviewed.  She will use this when she absolutely needs it and ibuprofen at other times.  We also dose reduced her Taxol.   I reviewed the above with Dr. Lindi Adie who is in agreement.  We will see Catherine Ellison back in 1 week for labs, follow-up, cycle 1 of Adriamycin and Cytoxan.

## 2021-05-23 ENCOUNTER — Inpatient Hospital Stay: Payer: BC Managed Care – PPO

## 2021-05-23 VITALS — BP 131/92 | HR 91 | Temp 99.1°F | Resp 16

## 2021-05-23 DIAGNOSIS — Z5111 Encounter for antineoplastic chemotherapy: Secondary | ICD-10-CM | POA: Diagnosis not present

## 2021-05-23 DIAGNOSIS — Z170 Estrogen receptor positive status [ER+]: Secondary | ICD-10-CM

## 2021-05-23 DIAGNOSIS — C50412 Malignant neoplasm of upper-outer quadrant of left female breast: Secondary | ICD-10-CM

## 2021-05-23 MED ORDER — SODIUM CHLORIDE 0.9% FLUSH
10.0000 mL | INTRAVENOUS | Status: DC | PRN
  Administered 2021-05-23: 10 mL

## 2021-05-23 MED ORDER — DIPHENHYDRAMINE HCL 50 MG/ML IJ SOLN
25.0000 mg | Freq: Once | INTRAMUSCULAR | Status: AC
  Administered 2021-05-23: 25 mg via INTRAVENOUS
  Filled 2021-05-23: qty 1

## 2021-05-23 MED ORDER — SODIUM CHLORIDE 0.9 % IV SOLN
60.0000 mg/m2 | Freq: Once | INTRAVENOUS | Status: AC
  Administered 2021-05-23: 126 mg via INTRAVENOUS
  Filled 2021-05-23: qty 21

## 2021-05-23 MED ORDER — FAMOTIDINE 20 MG IN NS 100 ML IVPB
20.0000 mg | Freq: Once | INTRAVENOUS | Status: AC
  Administered 2021-05-23: 20 mg via INTRAVENOUS
  Filled 2021-05-23: qty 100

## 2021-05-23 MED ORDER — SODIUM CHLORIDE 0.9 % IV SOLN
10.0000 mg | Freq: Once | INTRAVENOUS | Status: AC
  Administered 2021-05-23: 10 mg via INTRAVENOUS
  Filled 2021-05-23: qty 10

## 2021-05-23 MED ORDER — SODIUM CHLORIDE 0.9 % IV SOLN: Freq: Once | INTRAVENOUS | Status: AC

## 2021-05-23 MED ORDER — HEPARIN SOD (PORK) LOCK FLUSH 100 UNIT/ML IV SOLN
500.0000 [IU] | Freq: Once | INTRAVENOUS | Status: AC | PRN
  Administered 2021-05-23: 500 [IU]

## 2021-05-23 NOTE — Patient Instructions (Signed)
[facility] MEDICAL ONCOLOGY   Discharge Instructions: Thank you for choosing [facility] to provide your oncology and hematology care.   If you have a lab appointment with the Cancer Center, please go directly to the Cancer Center and check in at the registration area.   Wear comfortable clothing and clothing appropriate for easy access to any Portacath or PICC line.   We strive to give you quality time with your provider. You may need to reschedule your appointment if you arrive late (15 or more minutes).  Arriving late affects you and other patients whose appointments are after yours.  Also, if you miss three or more appointments without notifying the office, you may be dismissed from the clinic at the provider's discretion.      For prescription refill requests, have your pharmacy contact our office and allow 72 hours for refills to be completed.    Today you received the following chemotherapy and/or immunotherapy agents: paclitaxel.      To help prevent nausea and vomiting after your treatment, we encourage you to take your nausea medication as directed.  BELOW ARE SYMPTOMS THAT SHOULD BE REPORTED IMMEDIATELY: *FEVER GREATER THAN 100.4 F (38 C) OR HIGHER *CHILLS OR SWEATING *NAUSEA AND VOMITING THAT IS NOT CONTROLLED WITH YOUR NAUSEA MEDICATION *UNUSUAL SHORTNESS OF BREATH *UNUSUAL BRUISING OR BLEEDING *URINARY PROBLEMS (pain or burning when urinating, or frequent urination) *BOWEL PROBLEMS (unusual diarrhea, constipation, pain near the anus) TENDERNESS IN MOUTH AND THROAT WITH OR WITHOUT PRESENCE OF ULCERS (sore throat, sores in mouth, or a toothache) UNUSUAL RASH, SWELLING OR PAIN  UNUSUAL VAGINAL DISCHARGE OR ITCHING   Items with * indicate a potential emergency and should be followed up as soon as possible or go to the Emergency Department if any problems should occur.  Please show the CHEMOTHERAPY ALERT CARD or IMMUNOTHERAPY ALERT CARD at check-in  to the Emergency Department and triage nurse.  Should you have questions after your visit or need to cancel or reschedule your appointment, please contact [facility] MEDICAL ONCOLOGY  Dept: [phone]  and follow the prompts.  Office hours are 8:00 a.m. to 4:30 p.m. Monday - Friday. Please note that voicemails left after 4:00 p.m. may not be returned until the following business day.  We are closed weekends and major holidays. You have access to a nurse at all times for urgent questions. Please call the main number to the clinic Dept: [phone] and follow the prompts.   For any non-urgent questions, you may also contact your provider using MyChart. We now offer e-Visits for anyone 18 and older to request care online for non-urgent symptoms. For details visit [email or web address].   Also download the MyChart app! Go to the app store, search "MyChart", open the app, select [facility], and log in with your MyChart username and password.  Due to Covid, a mask is required upon entering the hospital/clinic. If you do not have a mask, one will be given to you upon arrival. For doctor visits, patients may have 1 support person aged 18 or older with them. For treatment visits, patients cannot have anyone with them due to current Covid guidelines and our immunocompromised population.   

## 2021-05-24 ENCOUNTER — Encounter: Payer: Self-pay | Admitting: Adult Health

## 2021-05-24 ENCOUNTER — Encounter: Payer: Self-pay | Admitting: *Deleted

## 2021-05-27 ENCOUNTER — Other Ambulatory Visit: Payer: Self-pay | Admitting: *Deleted

## 2021-05-27 ENCOUNTER — Telehealth: Payer: Self-pay | Admitting: *Deleted

## 2021-05-27 ENCOUNTER — Other Ambulatory Visit: Payer: Self-pay

## 2021-05-27 DIAGNOSIS — C50412 Malignant neoplasm of upper-outer quadrant of left female breast: Secondary | ICD-10-CM

## 2021-05-27 DIAGNOSIS — Z170 Estrogen receptor positive status [ER+]: Secondary | ICD-10-CM

## 2021-05-27 MED ORDER — LORAZEPAM 0.5 MG PO TABS: 0.5000 mg | ORAL_TABLET | Freq: Four times a day (QID) | ORAL | 0 refills | Status: DC | PRN

## 2021-05-27 MED ORDER — PROCHLORPERAZINE MALEATE 10 MG PO TABS: 10.0000 mg | ORAL_TABLET | Freq: Four times a day (QID) | ORAL | 1 refills | Status: DC | PRN

## 2021-05-27 MED ORDER — DEXAMETHASONE 4 MG PO TABS: 8.0000 mg | ORAL_TABLET | Freq: Every day | ORAL | 1 refills | Status: DC

## 2021-05-27 NOTE — Telephone Encounter (Signed)
Medication Prior Authorization Status  Processed CoverMyMeds KEY: [id number]  Submitted Today (05/27/2021 is currently pending)  Per [employer] Case ID: [id number]   Effective N/A through N/A.   On 05/30/2021 noted Lorazepam order changed yesterday to every 8 hours.  Insurance allows three pills daily.

## 2021-05-29 ENCOUNTER — Inpatient Hospital Stay (HOSPITAL_BASED_OUTPATIENT_CLINIC_OR_DEPARTMENT_OTHER): Payer: BC Managed Care – PPO | Admitting: Adult Health

## 2021-05-29 ENCOUNTER — Telehealth: Payer: Self-pay

## 2021-05-29 ENCOUNTER — Other Ambulatory Visit: Payer: Self-pay

## 2021-05-29 ENCOUNTER — Ambulatory Visit (HOSPITAL_COMMUNITY)
Admission: RE | Admit: 2021-05-29 | Discharge: 2021-05-29 | Disposition: A | Discharge status: Home / Self Care | Payer: BC Managed Care – PPO | Source: Ambulatory Visit | Attending: Student | Admitting: Student

## 2021-05-29 ENCOUNTER — Inpatient Hospital Stay: Payer: BC Managed Care – PPO

## 2021-05-29 ENCOUNTER — Other Ambulatory Visit: Payer: Self-pay | Admitting: Student

## 2021-05-29 ENCOUNTER — Other Ambulatory Visit: Payer: Self-pay | Admitting: Hematology and Oncology

## 2021-05-29 VITALS — HR 98

## 2021-05-29 VITALS — BP 132/72 | HR 110 | Temp 97.4°F | Resp 18 | Ht 70.0 in | Wt 193.3 lb

## 2021-05-29 DIAGNOSIS — C50412 Malignant neoplasm of upper-outer quadrant of left female breast: Secondary | ICD-10-CM

## 2021-05-29 DIAGNOSIS — Z452 Encounter for adjustment and management of vascular access device: Secondary | ICD-10-CM | POA: Diagnosis present

## 2021-05-29 DIAGNOSIS — Z170 Estrogen receptor positive status [ER+]: Secondary | ICD-10-CM | POA: Diagnosis not present

## 2021-05-29 DIAGNOSIS — Z95828 Presence of other vascular implants and grafts: Secondary | ICD-10-CM

## 2021-05-29 HISTORY — PX: IR CV LINE INJECTION: IMG2294

## 2021-05-29 LAB — CMP (CANCER CENTER ONLY)
ALT: 12 U/L (ref 0–44)
AST: 14 U/L — ABNORMAL LOW (ref 15–41)
Albumin: 3.9 g/dL (ref 3.5–5.0)
Alkaline Phosphatase: 91 U/L (ref 38–126)
Anion gap: 5 (ref 5–15)
BUN: 15 mg/dL (ref 6–20)
CO2: 27 mmol/L (ref 22–32)
Calcium: 9.1 mg/dL (ref 8.9–10.3)
Chloride: 104 mmol/L (ref 98–111)
Creatinine: 0.81 mg/dL (ref 0.44–1.00)
GFR, Estimated: >60 mL/min (ref >60)
Glucose, Bld: 107 mg/dL — ABNORMAL HIGH (ref 70–99)
Potassium: 3.9 mmol/L (ref 3.5–5.1)
Sodium: 136 mmol/L (ref 135–145)
Total Bilirubin: 0.5 mg/dL (ref 0.3–1.2)
Total Protein: 7.1 g/dL (ref 6.5–8.1)

## 2021-05-29 LAB — CBC WITH DIFFERENTIAL (CANCER CENTER ONLY)
Abs Immature Granulocytes: 0.03 10*3/uL (ref 0.00–0.07)
Basophils Absolute: 0 10*3/uL (ref 0.0–0.1)
Basophils Relative: 1 %
Eosinophils Absolute: 0.1 10*3/uL (ref 0.0–0.5)
Eosinophils Relative: 1 %
HCT: 29.3 % — ABNORMAL LOW (ref 36.0–46.0)
Hemoglobin: 9.5 g/dL — ABNORMAL LOW (ref 12.0–15.0)
Immature Granulocytes: 1 %
Lymphocytes Relative: 40 %
Lymphs Abs: 2 10*3/uL (ref 0.7–4.0)
MCH: 25.2 pg — ABNORMAL LOW (ref 26.0–34.0)
MCHC: 32.4 g/dL (ref 30.0–36.0)
MCV: 77.7 fL — ABNORMAL LOW (ref 80.0–100.0)
Monocytes Absolute: 0.5 10*3/uL (ref 0.1–1.0)
Monocytes Relative: 10 %
Neutro Abs: 2.3 10*3/uL (ref 1.7–7.7)
Neutrophils Relative %: 47 %
Platelet Count: 367 10*3/uL (ref 150–400)
RBC: 3.77 MIL/uL — ABNORMAL LOW (ref 3.87–5.11)
RDW: 23.9 % — ABNORMAL HIGH (ref 11.5–15.5)
WBC Count: 4.9 10*3/uL (ref 4.0–10.5)
nRBC: 0 % (ref 0.0–0.2)

## 2021-05-29 MED ORDER — SODIUM CHLORIDE 0.9% FLUSH
10.0000 mL | INTRAVENOUS | Status: DC | PRN
  Administered 2021-05-29: 10 mL

## 2021-05-29 MED ORDER — DOXORUBICIN HCL CHEMO IV INJECTION 2 MG/ML
60.0000 mg/m2 | Freq: Once | INTRAVENOUS | Status: AC
  Administered 2021-05-29: 126 mg via INTRAVENOUS
  Filled 2021-05-29: qty 63

## 2021-05-29 MED ORDER — SODIUM CHLORIDE 0.9 % IV SOLN
600.0000 mg/m2 | Freq: Once | INTRAVENOUS | Status: AC
  Administered 2021-05-29: 1260 mg via INTRAVENOUS
  Filled 2021-05-29: qty 63

## 2021-05-29 MED ORDER — SODIUM CHLORIDE 0.9 % IV SOLN: Freq: Once | INTRAVENOUS | Status: AC

## 2021-05-29 MED ORDER — PALONOSETRON HCL INJECTION 0.25 MG/5ML
0.2500 mg | Freq: Once | INTRAVENOUS | Status: AC
  Administered 2021-05-29: 0.25 mg via INTRAVENOUS
  Filled 2021-05-29: qty 5

## 2021-05-29 MED ORDER — SODIUM CHLORIDE 0.9 % IV SOLN
150.0000 mg | Freq: Once | INTRAVENOUS | Status: AC
  Administered 2021-05-29: 150 mg via INTRAVENOUS
  Filled 2021-05-29: qty 150

## 2021-05-29 MED ORDER — HEPARIN SOD (PORK) LOCK FLUSH 100 UNIT/ML IV SOLN
500.0000 [IU] | Freq: Once | INTRAVENOUS | Status: AC | PRN
  Administered 2021-05-29: 500 [IU]

## 2021-05-29 MED ORDER — SODIUM CHLORIDE 0.9% FLUSH
10.0000 mL | Freq: Once | INTRAVENOUS | Status: AC
  Administered 2021-05-29: 10 mL

## 2021-05-29 MED ORDER — SODIUM CHLORIDE 0.9 % IV SOLN
10.0000 mg | Freq: Once | INTRAVENOUS | Status: AC
  Administered 2021-05-29: 10 mg via INTRAVENOUS
  Filled 2021-05-29: qty 10

## 2021-05-29 MED ORDER — IOHEXOL 300 MG/ML  SOLN
50.0000 mL | Freq: Once | INTRAMUSCULAR | Status: AC | PRN
  Administered 2021-05-29: 10 mL via INTRAVENOUS

## 2021-05-29 NOTE — Patient Instructions (Signed)
[facility]  Discharge Instructions: Thank you for choosing [facility] to provide your oncology and hematology care.   If you have a lab appointment with the [facility], please go directly to the [facility] and check in at the registration area.   Wear comfortable clothing and clothing appropriate for easy access to any Portacath or PICC line.   We strive to give you quality time with your provider. You may need to reschedule your appointment if you arrive late (15 or more minutes).  Arriving late affects you and other patients whose appointments are after yours.  Also, if you miss three or more appointments without notifying the office, you may be dismissed from the clinic at the providers discretion.      For prescription refill requests, have your pharmacy contact our office and allow 72 hours for refills to be completed.    Today you received the following chemotherapy and/or immunotherapy agents : Adriamycin, Cytoxan      To help prevent nausea and vomiting after your treatment, we encourage you to take your nausea medication as directed.  BELOW ARE SYMPTOMS THAT SHOULD BE REPORTED IMMEDIATELY: *FEVER GREATER THAN 100.4 F (38 C) OR HIGHER *CHILLS OR SWEATING *NAUSEA AND VOMITING THAT IS NOT CONTROLLED WITH YOUR NAUSEA MEDICATION *UNUSUAL SHORTNESS OF BREATH *UNUSUAL BRUISING OR BLEEDING *URINARY PROBLEMS (pain or burning when urinating, or frequent urination) *BOWEL PROBLEMS (unusual diarrhea, constipation, pain near the anus) TENDERNESS IN MOUTH AND THROAT WITH OR WITHOUT PRESENCE OF ULCERS (sore throat, sores in mouth, or a toothache) UNUSUAL RASH, SWELLING OR PAIN  UNUSUAL VAGINAL DISCHARGE OR ITCHING   Items with * indicate a potential emergency and should be followed up as soon as possible or go to the Emergency Department if any problems should occur.  Please show the CHEMOTHERAPY ALERT CARD or IMMUNOTHERAPY ALERT CARD at  check-in to the Emergency Department and triage nurse.  Should you have questions after your visit or need to cancel or reschedule your appointment, please contact [facility]  Dept: [phone]  and follow the prompts.  Office hours are 8:00 a.m. to 4:30 p.m. Monday - Friday. Please note that voicemails left after 4:00 p.m. may not be returned until the following business day.  We are closed weekends and major holidays. You have access to a nurse at all times for urgent questions. Please call the main number to the clinic Dept: [phone] and follow the prompts.   For any non-urgent questions, you may also contact your provider using MyChart. We now offer e-Visits for anyone 31 and older to request care online for non-urgent symptoms. For details visit [email or web address].   Also download the MyChart app! Go to the app store, search "MyChart", open the app, select [facility], and log in with your MyChart username and password.  Due to Covid, a mask is required upon entering the hospital/clinic. If you do not have a mask, one will be given to you upon arrival. For doctor visits, patients may have 1 support person aged 58 or older with them. For treatment visits, patients cannot have anyone with them due to current Covid guidelines and our immunocompromised population.   Doxorubicin injection What is this medication? DOXORUBICIN (dox oh ROO bi sin) is a chemotherapy drug. It is used to treat many kinds of cancer like leukemia, lymphoma, neuroblastoma, sarcoma, and Wilms' tumor. It is also used to treat bladder cancer, breast cancer, lung cancer, ovarian cancer,  stomach cancer, and thyroid cancer. This medicine may be used for other purposes; ask your health care provider or pharmacist if you have questions. COMMON BRAND NAME(S): Adriamycin, Adriamycin PFS, Adriamycin RDF, Rubex What should I tell my care team before I take this medication? They need to know if  you have any of these conditions: heart disease history of low blood counts caused by a medicine liver disease recent or ongoing radiation therapy an unusual or allergic reaction to doxorubicin, other chemotherapy agents, other medicines, foods, dyes, or preservatives pregnant or trying to get pregnant breast-feeding How should I use this medication? This drug is given as an infusion into a vein. It is administered in a hospital or clinic by a specially trained health care professional. If you have pain, swelling, burning or any unusual feeling around the site of your injection, tell your health care professional right away. Talk to your pediatrician regarding the use of this medicine in children. Special care may be needed. Overdosage: If you think you have taken too much of this medicine contact a poison control center or emergency room at once. NOTE: This medicine is only for you. Do not share this medicine with others. What if I miss a dose? It is important not to miss your dose. Call your doctor or health care professional if you are unable to keep an appointment. What may interact with this medication? This medicine may interact with the following medications: 6-mercaptopurine paclitaxel phenytoin St. John's Wort trastuzumab verapamil This list may not describe all possible interactions. Give your health care provider a list of all the medicines, herbs, non-prescription drugs, or dietary supplements you use. Also tell them if you smoke, drink alcohol, or use illegal drugs. Some items may interact with your medicine. What should I watch for while using this medication? This drug may make you feel generally unwell. This is not uncommon, as chemotherapy can affect healthy cells as well as cancer cells. Report any side effects. Continue your course of treatment even though you feel ill unless your doctor tells you to stop. There is a maximum amount of this medicine you should receive  throughout your life. The amount depends on the medical condition being treated and your overall health. Your doctor will watch how much of this medicine you receive in your lifetime. Tell your doctor if you have taken this medicine before. You may need blood work done while you are taking this medicine. Your urine may turn red for a few days after your dose. This is not blood. If your urine is dark or brown, call your doctor. In some cases, you may be given additional medicines to help with side effects. Follow all directions for their use. Call your doctor or health care professional for advice if you get a fever, chills or sore throat, or other symptoms of a cold or flu. Do not treat yourself. This drug decreases your body's ability to fight infections. Try to avoid being around people who are sick. This medicine may increase your risk to bruise or bleed. Call your doctor or health care professional if you notice any unusual bleeding. Talk to your doctor about your risk of cancer. You may be more at risk for certain types of cancers if you take this medicine. Do not become pregnant while taking this medicine or for 6 months after stopping it. Women should inform their doctor if they wish to become pregnant or think they might be pregnant. Men should not father a child while  taking this medicine and for 6 months after stopping it. There is a potential for serious side effects to an unborn child. Talk to your health care professional or pharmacist for more information. Do not breast-feed an infant while taking this medicine. This medicine has caused ovarian failure in some women and reduced sperm counts in some men This medicine may interfere with the ability to have a child. Talk with your doctor or health care professional if you are concerned about your fertility. This medicine may cause a decrease in Co-Enzyme Q-10. You should make sure that you get enough Co-Enzyme Q-10 while you are taking this  medicine. Discuss the foods you eat and the vitamins you take with your health care professional. What side effects may I notice from receiving this medication? Side effects that you should report to your doctor or health care professional as soon as possible: allergic reactions like skin rash, itching or hives, swelling of the face, lips, or tongue breathing problems chest pain fast or irregular heartbeat low blood counts - this medicine may decrease the number of white blood cells, red blood cells and platelets. You may be at increased risk for infections and bleeding. pain, redness, or irritation at site where injected signs of infection - fever or chills, cough, sore throat, pain or difficulty passing urine signs of decreased platelets or bleeding - bruising, pinpoint red spots on the skin, black, tarry stools, blood in the urine swelling of the ankles, feet, hands tiredness weakness Side effects that usually do not require medical attention (report to your doctor or health care professional if they continue or are bothersome): diarrhea hair loss mouth sores nail discoloration or damage nausea red colored urine vomiting This list may not describe all possible side effects. Call your doctor for medical advice about side effects. You may report side effects to FDA at 1-800-FDA-1088. Where should I keep my medication? This drug is given in a hospital or clinic and will not be stored at home. NOTE: This sheet is a summary. It may not cover all possible information. If you have questions about this medicine, talk to your doctor, pharmacist, or health care provider.  2022 Elsevier/Gold Standard (2017-01-08 00:00:00)  Cyclophosphamide Injection What is this medication? CYCLOPHOSPHAMIDE (sye kloe FOSS fa mide) is a chemotherapy drug. It slows the growth of cancer cells. This medicine is used to treat many types of cancer like lymphoma, myeloma, leukemia, breast cancer, and ovarian cancer,  to name a few. This medicine may be used for other purposes; ask your health care provider or pharmacist if you have questions. COMMON BRAND NAME(S): Cytoxan, Neosar What should I tell my care team before I take this medication? They need to know if you have any of these conditions: heart disease history of irregular heartbeat infection kidney disease liver disease low blood counts, like white cells, platelets, or red blood cells on hemodialysis recent or ongoing radiation therapy scarring or thickening of the lungs trouble passing urine an unusual or allergic reaction to cyclophosphamide, other medicines, foods, dyes, or preservatives pregnant or trying to get pregnant breast-feeding How should I use this medication? This drug is usually given as an injection into a vein or muscle or by infusion into a vein. It is administered in a hospital or clinic by a specially trained health care professional. Talk to your pediatrician regarding the use of this medicine in children. Special care may be needed. Overdosage: If you think you have taken too much of this medicine contact  a poison control center or emergency room at once. NOTE: This medicine is only for you. Do not share this medicine with others. What if I miss a dose? It is important not to miss your dose. Call your doctor or health care professional if you are unable to keep an appointment. What may interact with this medication? amphotericin B azathioprine certain antivirals for HIV or hepatitis certain medicines for blood pressure, heart disease, irregular heart beat certain medicines that treat or prevent blood clots like warfarin certain other medicines for cancer cyclosporine etanercept indomethacin medicines that relax muscles for surgery medicines to increase blood counts metronidazole This list may not describe all possible interactions. Give your health care provider a list of all the medicines, herbs,  non-prescription drugs, or dietary supplements you use. Also tell them if you smoke, drink alcohol, or use illegal drugs. Some items may interact with your medicine. What should I watch for while using this medication? Your condition will be monitored carefully while you are receiving this medicine. You may need blood work done while you are taking this medicine. Drink water or other fluids as directed. Urinate often, even at night. Some products may contain alcohol. Ask your health care professional if this medicine contains alcohol. Be sure to tell all health care professionals you are taking this medicine. Certain medicines, like metronidazole and disulfiram, can cause an unpleasant reaction when taken with alcohol. The reaction includes flushing, headache, nausea, vomiting, sweating, and increased thirst. The reaction can last from 30 minutes to several hours. Do not become pregnant while taking this medicine or for 1 year after stopping it. Women should inform their health care professional if they wish to become pregnant or think they might be pregnant. Men should not father a child while taking this medicine and for 4 months after stopping it. There is potential for serious side effects to an unborn child. Talk to your health care professional for more information. Do not breast-feed an infant while taking this medicine or for 1 week after stopping it. This medicine has caused ovarian failure in some women. This medicine may make it more difficult to get pregnant. Talk to your health care professional if you are concerned about your fertility. This medicine has caused decreased sperm counts in some men. This may make it more difficult to father a child. Talk to your health care professional if you are concerned about your fertility. Call your health care professional for advice if you get a fever, chills, or sore throat, or other symptoms of a cold or flu. Do not treat yourself. This medicine  decreases your body's ability to fight infections. Try to avoid being around people who are sick. Avoid taking medicines that contain aspirin, acetaminophen, ibuprofen, naproxen, or ketoprofen unless instructed by your health care professional. These medicines may hide a fever. Talk to your health care professional about your risk of cancer. You may be more at risk for certain types of cancer if you take this medicine. If you are going to need surgery or other procedure, tell your health care professional that you are using this medicine. Be careful brushing or flossing your teeth or using a toothpick because you may get an infection or bleed more easily. If you have any dental work done, tell your dentist you are receiving this medicine. What side effects may I notice from receiving this medication? Side effects that you should report to your doctor or health care professional as soon as possible: allergic reactions like  skin rash, itching or hives, swelling of the face, lips, or tongue breathing problems nausea, vomiting signs and symptoms of bleeding such as bloody or black, tarry stools; red or dark brown urine; spitting up blood or brown material that looks like coffee grounds; red spots on the skin; unusual bruising or bleeding from the eyes, gums, or nose signs and symptoms of heart failure like fast, irregular heartbeat, sudden weight gain; swelling of the ankles, feet, hands signs and symptoms of infection like fever; chills; cough; sore throat; pain or trouble passing urine signs and symptoms of kidney injury like trouble passing urine or change in the amount of urine signs and symptoms of liver injury like dark yellow or brown urine; general ill feeling or flu-like symptoms; light-colored stools; loss of appetite; nausea; right upper belly pain; unusually weak or tired; yellowing of the eyes or skin Side effects that usually do not require medical attention (report to your doctor or health  care professional if they continue or are bothersome): confusion decreased hearing diarrhea facial flushing hair loss headache loss of appetite missed menstrual periods signs and symptoms of low red blood cells or anemia such as unusually weak or tired; feeling faint or lightheaded; falls skin discoloration This list may not describe all possible side effects. Call your doctor for medical advice about side effects. You may report side effects to FDA at 1-800-FDA-1088. Where should I keep my medication? This drug is given in a hospital or clinic and will not be stored at home. NOTE: This sheet is a summary. It may not cover all possible information. If you have questions about this medicine, talk to your doctor, pharmacist, or health care provider.  2022 Elsevier/Gold Standard (2021-01-22 00:00:00)

## 2021-05-29 NOTE — Telephone Encounter (Signed)
Per [facility] and spoke to [facility] to clarify lorazepam Rx, 1 tab by mouth (0.5mg ) every 8 hours as needed #30. Rx confirmed and will fill

## 2021-05-29 NOTE — Progress Notes (Signed)
Interventional Radiology Procedure Note  Procedure:   Port check, with injection  Findings: No fracture, pinch off, or extravasation.  No fibrin sheath. Patient is asymptomatic at this point.  .  Complications: None Recommendations:  - Ok to use  Signed,  Dulcy Fanny. Earleen Newport, DO

## 2021-05-30 ENCOUNTER — Encounter: Payer: Self-pay | Admitting: Adult Health

## 2021-05-30 ENCOUNTER — Encounter: Payer: Self-pay | Admitting: Oncology

## 2021-05-30 ENCOUNTER — Other Ambulatory Visit: Payer: Self-pay

## 2021-05-30 ENCOUNTER — Telehealth: Payer: Self-pay

## 2021-05-30 DIAGNOSIS — C50412 Malignant neoplasm of upper-outer quadrant of left female breast: Secondary | ICD-10-CM

## 2021-05-30 MED ORDER — DEXAMETHASONE 4 MG PO TABS: 8.0000 mg | ORAL_TABLET | Freq: Every day | ORAL | 1 refills | Status: DC

## 2021-05-30 NOTE — Telephone Encounter (Signed)
Call to [facility] services regarding pending approval for prior authorization of Lorazepam 0.5mg  tablets. Representative stated that the claim remained under review at this time and to allow up to 5 days for review.  Representative stated that the Pharmacy had entered a paid cash claim at this time and that the Patient had received the medication. Will continue to monitor for authorization and notify Patient if approved or denied.

## 2021-05-31 ENCOUNTER — Encounter: Payer: Self-pay | Admitting: *Deleted

## 2021-05-31 ENCOUNTER — Inpatient Hospital Stay: Payer: BC Managed Care – PPO

## 2021-05-31 ENCOUNTER — Telehealth: Payer: Self-pay

## 2021-05-31 ENCOUNTER — Other Ambulatory Visit: Payer: Self-pay

## 2021-05-31 ENCOUNTER — Encounter: Payer: Self-pay | Admitting: Oncology

## 2021-05-31 VITALS — BP 139/88 | HR 95 | Temp 98.7°F | Resp 18

## 2021-05-31 DIAGNOSIS — C50412 Malignant neoplasm of upper-outer quadrant of left female breast: Secondary | ICD-10-CM

## 2021-05-31 DIAGNOSIS — Z5111 Encounter for antineoplastic chemotherapy: Secondary | ICD-10-CM | POA: Diagnosis not present

## 2021-05-31 DIAGNOSIS — Z170 Estrogen receptor positive status [ER+]: Secondary | ICD-10-CM

## 2021-05-31 MED ORDER — PEGFILGRASTIM-CBQV 6 MG/0.6ML ~~LOC~~ SOSY
6.0000 mg | PREFILLED_SYRINGE | Freq: Once | SUBCUTANEOUS | Status: AC
  Administered 2021-05-31: 6 mg via SUBCUTANEOUS
  Filled 2021-05-31: qty 0.6

## 2021-05-31 NOTE — Progress Notes (Signed)
[facility] Follow up:    Catherine Mola, NP No address on file   DIAGNOSIS:  Cancer Staging  Malignant neoplasm of upper-outer quadrant of left breast in female, estrogen receptor positive ([place]) Staging form: Breast, AJCC 8th Edition - Clinical stage from 02/13/2021: Stage IIIA (cT2, cN1, cM0, G3, ER+, PR-, HER2-) - Signed by [name], MD on 02/13/2021 Stage prefix: Initial diagnosis Histologic grading system: 3 grade system Laterality: Left Staged by: Pathologist and managing physician Stage used in treatment planning: Yes National guidelines used in treatment planning: Yes Type of national guideline used in treatment planning: NCCN   SUMMARY OF ONCOLOGIC HISTORY: Oncology History  Malignant neoplasm of upper-outer quadrant of left breast in female, estrogen receptor positive ([place])  02/11/2021 Initial Diagnosis   Malignant neoplasm of upper-outer quadrant of left breast in female, estrogen receptor positive ([place])   02/13/2021 Cancer Staging   Staging form: Breast, AJCC 8th Edition - Clinical stage from 02/13/2021: Stage IIIA (cT2, cN1, cM0, G3, ER+, PR-, HER2-) - Signed by [name], MD on 02/13/2021 Stage prefix: Initial diagnosis Histologic grading system: 3 grade system Laterality: Left Staged by: Pathologist and managing physician Stage used in treatment planning: Yes National guidelines used in treatment planning: Yes Type of national guideline used in treatment planning: NCCN    02/19/2021 Genetic Testing   [facility] [employer] (21 genes) 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 The report date is 02/26/2021.   The CustomNext-Cancer+RNAinsight panel offered by [facility] includes sequencing and rearrangement analysis for the following 47 genes:  APC, ATM, AXIN2, BARD1, BMPR1A, BRCA1,  BRCA2, BRIP1, CDH1, CDK4, CDKN2A, CHEK2, DICER1, EPCAM, GREM1, HOXB13, MEN1, MLH1, MSH2, MSH3, MSH6, MUTYH, NBN, NF1, NF2, NTHL1, PALB2, PMS2, POLD1, POLE, PTEN, RAD51C, RAD51D, RECQL, RET, SDHA, SDHAF2, SDHB, SDHC, SDHD, SMAD4, SMARCA4, STK11, TP53, TSC1, TSC2, and VHL.  RNA data is routinely analyzed for use in variant interpretation for all genes.   03/01/2021 -  Chemotherapy   Patient is on Treatment Plan : BREAST PACLitaxel 7d / Adjuvant Dose Dense AC q14d       CURRENT THERAPY: Adriamycin and Cytoxan  INTERVAL HISTORY: Catherine Ellison 46 y.o. female returns for evaluation prior to receiving her first dose of Adriamycin and Cytoxan.  She notes that she has been slightly more fatigued this past week.  She notes her left breast mass continues to shrink.  She has her antinausea medications and understands how to take them following her treatment.  She is concerned today because after her port was accessed she began experiencing discomfort just below the port.  She described it as a burning sensation.   Patient Active Problem List   Diagnosis Date Noted   Port-A-Cath in place 86/57/8469   Monoallelic mutation of MUTYH gene 03/04/2021   Genetic testing 02/20/2021   Family history of breast cancer 02/13/2021   Family history of colon cancer 02/13/2021   Malignant neoplasm of upper-outer quadrant of left breast in female, estrogen receptor positive ([place]) 02/11/2021   Cervical cancer screening 06/30/2019   Menorrhagia with regular cycle 06/30/2019   Vaginal bleeding 06/30/2019   Adjustment disorder with mixed anxiety and depressed mood 11/06/2016    is allergic to latex.  MEDICAL HISTORY: Past Medical History:  Diagnosis Date   Allergy    seasonal    Anemia  Complication of anesthesia    bp dropped with 1st ovary surgery, no problems since with surgery   Dyspnea    with heavy exertion   GERD (gastroesophageal reflux disease)    History of gestational diabetes 2005    Hyperemesis gravidarum    with all pregnancies   IBS (irritable bowel syndrome)    left breast cancer    Peptic ulcer    Pneumonia     SURGICAL HISTORY: Past Surgical History:  Procedure Laterality Date   BILATERAL SALPINGECTOMY Bilateral 2014   emergency   IR CV LINE INJECTION  05/29/2021   LEFT OOPHORECTOMY Left 2006   PORTACATH PLACEMENT Right 02/28/2021   Procedure: INSERTION PORT-A-CATH;  Surgeon: Coralie Keens, MD;  Location: [facility];  Service: General;  Laterality: Right;    SOCIAL HISTORY: Social History   Socioeconomic History   Marital status: Divorced    Spouse name: Not on file   Number of children: 3   Years of education: Not on file   Highest education level: Not on file  Occupational History   Occupation: nurse    Comment: mental health  Tobacco Use   Smoking status: Never   Smokeless tobacco: Never  Vaping Use   Vaping Use: Never used  Substance and Sexual Activity   Alcohol use: Yes    Alcohol/week: 5.0 standard drinks    Types: 1 Glasses of wine, 1 Cans of beer, 1 Shots of liquor, 2 Standard drinks or equivalent per week    Comment: 3-4 drinks per day   Drug use: No   Sexual activity: Not on file  Other Topics Concern   Not on file  Social History Narrative   Lives with her youngest child.   Social Determinants of Health   Financial Resource Strain: Medium Risk   Difficulty of Paying Living Expenses: Somewhat hard  Food Insecurity: No Food Insecurity   Worried About Charity fundraiser in the Last Year: Never true   Ran Out of Food in the Last Year: Never true  Transportation Needs: No Transportation Needs   Lack of Transportation (Medical): No   Lack of Transportation (Non-Medical): No  Physical Activity: Not on file  Stress: Not on file  Social Connections: Not on file  Intimate Partner Violence: Not on file    FAMILY HISTORY: Family History  Problem Relation Age of Onset   Diabetes Mother    Hypertension  Mother    Thyroid disease Mother    Diabetes Father    Heart disease Father        4V CABG 03/2016, age 26   Hyperlipidemia Father    Hypertension Father    Hypothyroidism Father    Diabetes Sister    Hypertension Sister    Hypertension Maternal Grandmother    Colon cancer Maternal Grandmother        dx. >50   Hypertension Maternal Grandfather    Hypertension Paternal Grandmother    Kidney disease Paternal Grandmother    Hypertension Paternal Grandfather    Lung cancer Paternal Grandfather    Breast cancer Maternal Aunt        dx. 40s    Review of Systems  Constitutional:  Positive for fatigue. Negative for appetite change, chills, fever and unexpected weight change.  HENT:   Negative for hearing loss, lump/mass and trouble swallowing.   Eyes:  Negative for eye problems and icterus.  Respiratory:  Negative for chest tightness, cough and shortness of breath.   Cardiovascular:  Negative for chest pain, leg swelling and palpitations.  Gastrointestinal:  Negative for abdominal distention, abdominal pain, constipation, diarrhea, nausea and vomiting.  Endocrine: Negative for hot flashes.  Genitourinary:  Negative for difficulty urinating.   Musculoskeletal:  Negative for arthralgias.  Skin:  Negative for itching and rash.  Neurological:  Negative for dizziness, extremity weakness, headaches and numbness.  Hematological:  Negative for adenopathy. Does not bruise/bleed easily.  Psychiatric/Behavioral:  Negative for depression. The patient is not nervous/anxious.      PHYSICAL EXAMINATION  ECOG PERFORMANCE STATUS: 1 - Symptomatic but completely ambulatory  Vitals:   05/29/21 1123  BP: 132/72  Pulse: (!) 110  Resp: 18  Temp: (!) 97.4 F (36.3 C)  SpO2: 100%    Physical Exam Constitutional:      General: She is not in acute distress.    Appearance: Normal appearance. She is not toxic-appearing.  HENT:     Head: Normocephalic and atraumatic.  Eyes:     General: No  scleral icterus. Cardiovascular:     Rate and Rhythm: Normal rate and regular rhythm.     Pulses: Normal pulses.     Heart sounds: Normal heart sounds.  Pulmonary:     Effort: Pulmonary effort is normal.     Breath sounds: Normal breath sounds.  Abdominal:     General: Abdomen is flat. Bowel sounds are normal. There is no distension.     Palpations: Abdomen is soft.     Tenderness: There is no abdominal tenderness.  Musculoskeletal:        General: No swelling.     Cervical back: Neck supple.  Lymphadenopathy:     Cervical: No cervical adenopathy.  Skin:    General: Skin is warm and dry.     Findings: No rash.  Neurological:     General: No focal deficit present.     Mental Status: She is alert.  Psychiatric:        Mood and Affect: Mood normal.        Behavior: Behavior normal.    LABORATORY DATA:  CBC    Component Value Date/Time   WBC 4.9 05/29/2021 1105   RBC 3.77 (L) 05/29/2021 1105   HGB 9.5 (L) 05/29/2021 1105   HGB 9.5 (L) 06/30/2019 1551   HCT 29.3 (L) 05/29/2021 1105   HCT 30.8 (L) 06/30/2019 1551   PLT 367 05/29/2021 1105   PLT 389 06/30/2019 1551   MCV 77.7 (L) 05/29/2021 1105   MCV 72 (L) 06/30/2019 1551   MCH 25.2 (L) 05/29/2021 1105   MCHC 32.4 05/29/2021 1105   RDW 23.9 (H) 05/29/2021 1105   RDW 16.8 (H) 06/30/2019 1551   LYMPHSABS 2.0 05/29/2021 1105   LYMPHSABS 3.4 (H) 06/30/2019 1551   MONOABS 0.5 05/29/2021 1105   EOSABS 0.1 05/29/2021 1105   EOSABS 0.1 06/30/2019 1551   BASOSABS 0.0 05/29/2021 1105   BASOSABS 0.1 06/30/2019 1551    CMP     Component Value Date/Time   NA 136 05/29/2021 1105   NA 138 08/20/2016 1712   K 3.9 05/29/2021 1105   CL 104 05/29/2021 1105   CO2 27 05/29/2021 1105   GLUCOSE 107 (H) 05/29/2021 1105   BUN 15 05/29/2021 1105   BUN 11 08/20/2016 1712   CREATININE 0.81 05/29/2021 1105   CALCIUM 9.1 05/29/2021 1105   PROT 7.1 05/29/2021 1105   PROT 6.9 08/20/2016 1712   ALBUMIN 3.9 05/29/2021 1105   ALBUMIN  4.0 08/20/2016 1712  AST 14 (L) 05/29/2021 1105   ALT 12 05/29/2021 1105   ALKPHOS 91 05/29/2021 1105   BILITOT 0.5 05/29/2021 1105   GFRNONAA >60 05/29/2021 1105   GFRAA 88 08/20/2016 1712       PENDING LABS:   RADIOGRAPHIC STUDIES:  IR CV Line Injection  Result Date: 05/29/2021 INDICATION: 46 year old female with burning at the right chest wall near the port site referred for port check EXAM: PORT CHECK MEDICATIONS: None ANESTHESIA/SEDATION: None FLUOROSCOPY TIME:  0 minutes, 24 seconds (13 mGy) COMPLICATIONS: None PROCEDURE: The procedure, risks, benefits, and alternatives were explained to the patient. Questions regarding the procedure were encouraged and answered. The patient understands and consents to the procedure. Port catheter and previously been accessed by the infusion center. X-ray was performed and reviewed. Contrast was then injected through the port after aspirating blood into the hub of the port catheter. No extravasation, no fractured catheter, no kinked or pinched catheter, and no fibrin sheath. Digital subtraction image was then performed with contrast noted to flow rapidly away from the tip of the catheter. Catheter was then flushed with saline and the patient was returned to the infusion center. IMPRESSION: Port check demonstrates adequate function of port catheter, with no complicating features. Patient was asymptomatic at the time of the port check. PLAN: Ok to use Electronically Signed   By: Corrie Mckusick D.O.   On: 05/29/2021 15:16     PATHOLOGY:     ASSESSMENT and THERAPY PLAN:   Malignant neoplasm of upper-outer quadrant of left breast in female, estrogen receptor positive ([place]) Catherine Ellison is a 46 year old woman who is here today for evaluation prior to receiving neoadjuvant chemotherapy for her stage IIIa estrogen positive breast cancer.  1.  Stage IIIa estrogen positive breast cancer.  She will proceed with her first cycle of doxorubicin and  cyclophosphamide.  Her labs are stable today and we reviewed them.  Prior to receiving her treatment however we will get a dye study to ensure that the flow of her port is normal.  We spent a long time talking about her antinausea regimen.  She understands how to take this.  I am hopeful that the dexamethasone will help with some of her joint aches and pains that she experienced with the Taxol.  We will see Catherine Ellison back in 1 week for labs, follow-up with Dr. Lindi Adie   All questions were answered. The patient knows to call the clinic with any problems, questions or concerns. We can certainly see the patient much sooner if necessary.  Total encounter time: 20 minutes in face-to-face visit time, chart review, lab review, care coordination, and documentation of the encounter.  Wilber Bihari, NP 05/31/21 9:36 AM Medical Oncology and Hematology [facility] [place], [place] [address] Tel. [phone]    Fax. [phone]  *Total Encounter Time as defined by the Centers for Medicare and Medicaid Services includes, in addition to the face-to-face time of a patient visit (documented in the note above) non-face-to-face time: obtaining and reviewing outside history, ordering and reviewing medications, tests or procedures, care coordination (communications with other health care professionals or caregivers) and documentation in the medical record.

## 2021-05-31 NOTE — Telephone Encounter (Addendum)
I spoke with Catherine Ellison today and advised her that we do not have any recent requests for records or forms to be filled out at this time. I advised her to call the Company and have the information faxed to [place]. I provided her with the fax number and advised her to sign and date a current Release of Information form from the Company prior to having them request the information due to the requirements provided by Health Information Management regarding release of medical records. Records will only be released up to the date of signature on the form and her last form was signed several months ago.

## 2021-05-31 NOTE — Assessment & Plan Note (Signed)
Catherine Ellison is a 46 year old woman who is here today for evaluation prior to receiving neoadjuvant chemotherapy for her stage IIIa estrogen positive breast cancer.  1.  Stage IIIa estrogen positive breast cancer.  She will proceed with her first cycle of doxorubicin and cyclophosphamide.  Her labs are stable today and we reviewed them.  Prior to receiving her treatment however we will get a dye study to ensure that the flow of her port is normal.  We spent a long time talking about her antinausea regimen.  She understands how to take this.  I am hopeful that the dexamethasone will help with some of her joint aches and pains that she experienced with the Taxol.  We will see Catherine Ellison back in 1 week for labs, follow-up with Dr. Lindi Adie

## 2021-05-31 NOTE — Telephone Encounter (Signed)
Patient notified of Prior Authorization approval for Lorazepam 0.5mg  tablets every 6 hours. (Quantity of 30 for a 7 day supply). Medication is approved through 05/30/2022. No further needs voiced at this time.

## 2021-06-04 ENCOUNTER — Other Ambulatory Visit: Payer: Self-pay | Admitting: Hematology and Oncology

## 2021-06-04 NOTE — Progress Notes (Incomplete)
Patient Care Team: [name], NP as PCP - General (Adult Health Nurse Practitioner) Coralie Keens, MD as Consulting Physician (General Surgery) Eppie Gibson, MD as Attending Physician (Radiation Oncology) Rockwell Germany, RN as Oncology Nurse Navigator Mauro Kaufmann, RN as Oncology Nurse Navigator Causey, Charlestine Massed, NP as Nurse Practitioner (Hematology and Oncology) Nicholas Lose, MD as Consulting Physician (Hematology and Oncology)  DIAGNOSIS: No diagnosis found.  SUMMARY OF ONCOLOGIC HISTORY: Oncology History  Malignant neoplasm of upper-outer quadrant of left breast in female, estrogen receptor positive ([place])  02/11/2021 Initial Diagnosis   Malignant neoplasm of upper-outer quadrant of left breast in female, estrogen receptor positive ([place])   02/13/2021 Cancer Staging   Staging form: Breast, AJCC 8th Edition - Clinical stage from 02/13/2021: Stage IIIA (cT2, cN1, cM0, G3, ER+, PR-, HER2-) - Signed by [name], MD on 02/13/2021 Stage prefix: Initial diagnosis Histologic grading system: 3 grade system Laterality: Left Staged by: Pathologist and managing physician Stage used in treatment planning: Yes National guidelines used in treatment planning: Yes Type of national guideline used in treatment planning: NCCN    02/19/2021 Genetic Testing   [place] (82 genes) 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 The report date is 02/26/2021.   The CustomNext-Cancer+RNAinsight panel offered by [facility] includes sequencing and rearrangement analysis for the following 47 genes:  APC, ATM, AXIN2, BARD1, BMPR1A, BRCA1, BRCA2, BRIP1, CDH1, CDK4, CDKN2A, CHEK2, DICER1, EPCAM, GREM1, HOXB13, MEN1, MLH1, MSH2, MSH3, MSH6, MUTYH, NBN, NF1, NF2, NTHL1, PALB2, PMS2, POLD1, POLE, PTEN, RAD51C, RAD51D, RECQL, RET,  SDHA, SDHAF2, SDHB, SDHC, SDHD, SMAD4, SMARCA4, STK11, TP53, TSC1, TSC2, and VHL.  RNA data is routinely analyzed for use in variant interpretation for all genes.   03/01/2021 -  Chemotherapy   Patient is on Treatment Plan : BREAST PACLitaxel 7d / Adjuvant Dose Dense AC q14d       CHIEF COMPLIANT: Follow-up of left breast cancer  INTERVAL HISTORY: Catherine Ellison is a 46 y.o. with above-mentioned history of left breast cancer, currently on chemotherapy with AC. She presents to the clinic for follow-up and toxicity check.   ALLERGIES:  is allergic to latex.  MEDICATIONS:  Current Outpatient Medications  Medication Sig Dispense Refill   acetaminophen (TYLENOL) 325 MG tablet Take 650 mg by mouth every 6 (six) hours as needed.     dexamethasone (DECADRON) 4 MG tablet Take 2 tablets (8 mg total) by mouth daily. Take daily for 3 days after chemo. Take with food. 30 tablet 1   dicyclomine (BENTYL) 20 MG tablet Take 1 tablet (20 mg total) by mouth every 6 (six) hours. 30 tablet 3   DULoxetine (CYMBALTA) 30 MG capsule Take 30 mg by mouth as needed.     famotidine (PEPCID) 20 MG tablet Take 1 tablet (20 mg total) by mouth 2 (two) times daily. 60 tablet 5   gabapentin (NEURONTIN) 300 MG capsule Take 1 capsule (300 mg total) by mouth 3 (three) times daily. 90 capsule 1   ibuprofen (ADVIL) 800 MG tablet Take 800 mg by mouth.     lidocaine-prilocaine (EMLA) cream Apply to affected area once 30 g 3   LINACLOTIDE PO Take 72 mcg by mouth daily. linzess     LORazepam (ATIVAN) 0.5 MG tablet Take 1 tablet (0.5 mg total) by mouth every 6 (six) hours as needed (Nausea  or vomiting). 30 tablet 0   Melatonin 3 MG TABS Take 1 tablet (3 mg total) by mouth at bedtime. (Patient taking differently: Take 3 mg by mouth at bedtime as needed.) 60 tablet 0   ondansetron (ZOFRAN) 8 MG tablet Take 1 tablet (8 mg total) by mouth 2 (two) times daily as needed (Nausea or vomiting). 30 tablet 1   oxyCODONE (OXY  IR/ROXICODONE) 5 MG immediate release tablet Take 0.5-1 tablets (2.5-5 mg total) by mouth every 4 (four) hours as needed for severe pain. 30 tablet 0   pantoprazole (PROTONIX) 40 MG tablet Take 1 tablet (40 mg total) by mouth daily. 90 tablet 1   prochlorperazine (COMPAZINE) 10 MG tablet Take 1 tablet (10 mg total) by mouth every 6 (six) hours as needed (Nausea or vomiting). 30 tablet 1   TRULANCE 3 MG TABS TAKE 1 TABLET BY MOUTH DAILY 60 tablet 3   venlafaxine XR (EFFEXOR-XR) 75 MG 24 hr capsule Take 1 capsule (75 mg total) by mouth daily with breakfast. Increase to 2 tab if tolerated after taking for 2 weeks. 90 capsule 3   No current facility-administered medications for this visit.    PHYSICAL EXAMINATION: ECOG PERFORMANCE STATUS: {CHL ONC ECOG PS:[id number]}  There were no vitals filed for this visit. There were no vitals filed for this visit.  BREAST:*** No palpable masses or nodules in either right or left breasts. No palpable axillary supraclavicular or infraclavicular adenopathy no breast tenderness or nipple discharge. (exam performed in the presence of a chaperone)  LABORATORY DATA:  I have reviewed the data as listed CMP Latest Ref Rng & Units 05/29/2021 05/22/2021 05/14/2021  Glucose 70 - 99 mg/dL 107(H) 94 112(H)  BUN 6 - 20 mg/dL '15 13 15  ' Creatinine 0.44 - 1.00 mg/dL 0.81 0.77 0.89  Sodium 135 - 145 mmol/L 136 137 136  Potassium 3.5 - 5.1 mmol/L 3.9 3.7 4.1  Chloride 98 - 111 mmol/L 104 105 106  CO2 22 - 32 mmol/L '27 26 23  ' Calcium 8.9 - 10.3 mg/dL 9.1 9.1 9.1  Total Protein 6.5 - 8.1 g/dL 7.1 6.9 7.0  Total Bilirubin 0.3 - 1.2 mg/dL 0.5 0.4 0.3  Alkaline Phos 38 - 126 U/L 91 91 85  AST 15 - 41 U/L 14(L) 16 14(L)  ALT 0 - 44 U/L '12 14 12    ' Lab Results  Component Value Date   WBC 4.9 05/29/2021   HGB 9.5 (L) 05/29/2021   HCT 29.3 (L) 05/29/2021   MCV 77.7 (L) 05/29/2021   PLT 367 05/29/2021   NEUTROABS 2.3 05/29/2021    ASSESSMENT & PLAN:  No  problem-specific Assessment & Plan notes found for this encounter.    No orders of the defined types were placed in this encounter.  The patient has a good understanding of the overall plan. she agrees with it. she will call with any problems that may develop before the next visit here.  Total time spent: *** mins including face to face time and time spent for planning, charting and coordination of care  Rulon Eisenmenger, MD, MPH 06/04/2021  I, Thana Ates, am acting as scribe for Dr. Nicholas Lose.  {insert scribe attestation}

## 2021-06-05 ENCOUNTER — Inpatient Hospital Stay: Payer: BC Managed Care – PPO

## 2021-06-05 ENCOUNTER — Inpatient Hospital Stay: Payer: BC Managed Care – PPO | Admitting: Hematology and Oncology

## 2021-06-05 NOTE — Assessment & Plan Note (Deleted)
02/11/2021 left breast upper outer quadrant biopsy 02/07/2021 for a clinical T2 Ni, stage IIIA invasive ductal carcinoma, grade 3, ER 70% weak, PR 0% and HER2 negative, Ki-67: 40% Neoadjuvant chemotherapy with Taxol weekly x12 followed by dose dense Adriamycin and Cytoxan x4 started 03/01/2021 Current treatment: Cycle 1 day 8 dose dense Adriamycin and Cytoxan Chemo toxicities:  Return to clinic in 1 week for cycle 2

## 2021-06-10 ENCOUNTER — Telehealth: Payer: Self-pay | Admitting: *Deleted

## 2021-06-10 NOTE — Telephone Encounter (Signed)
Secure chat message received regarding patient short-term disability cancellation.   Form received 06/03/2021 in queue to process over 14 calendar day protocol is a duplicate of form completed 02/25/2021; sent to (SW) H.I.M. to process record request per (SW) H.I.M. protocol.   Noted initial request addressed to individual provider viewed as not H.I.P.A.A compliant (Individuals working for [employer] are not authorized individually to release records) also provider originally caring for patient is retired.  Connected with AERIE DONICA [phone]) to request a signed [employer] release for use/disclosure and provide (SW) Phone and fax number.   "I'll sign this and get the phone number when I come in 06/13/2021." Connected with Edison Nasuti) with Teena Dunk [phone]).  Informed of need of H.I.P.A.A authorized request to legal name "Hickman".  Provided name of current provider, confirmed same campus and contact information along with (SW) H.I.M. phone [phone] opt 6) and fax [phone]) on different campus still associated with provider.   Edison Nasuti confirms new form is needed by [facility] with medical records as previously requested.  .   Nursing unable to view any EPIC (SW) HIM letters sent to [facility] and/or patient per [facility] security protocol.

## 2021-06-11 ENCOUNTER — Encounter: Payer: Self-pay | Admitting: *Deleted

## 2021-06-12 NOTE — Assessment & Plan Note (Signed)
left breast upper outer quadrant biopsy 02/07/2021 for a clinical T2 Ni, stage IIIA invasive ductal carcinoma, grade 3, ER 70% weak, PR 0% and HER2 negative, Ki-67: 40% Stage IIIa  Treatment plan: 1.  Neoadjuvant chemotherapy with weekly Taxol x12 followed by dose dense Adriamycin and Cytoxan  2. followed by breast conserving surgery 3.  Followed by radiation 4.  Followed by antiestrogen therapy -------------------------------------------------------------------------------------------------------------------------------- Current treatment: Patient completed 12 cycles of weekly Taxol today is cycle 2 Adriamycin and Cytoxan Chemo toxicities:  Return to clinic in 2 weeks for cycle 3

## 2021-06-12 NOTE — Progress Notes (Signed)
Patient Care Team: Catherine Mola, NP as PCP - General (Adult Health Nurse Practitioner) Coralie Keens, MD as Consulting Physician (General Surgery) Eppie Gibson, MD as Attending Physician (Radiation Oncology) Rockwell Germany, RN as Oncology Nurse Navigator Mauro Kaufmann, RN as Oncology Nurse Navigator Causey, Charlestine Massed, NP as Nurse Practitioner (Hematology and Oncology) Nicholas Lose, MD as Consulting Physician (Hematology and Oncology)  DIAGNOSIS:    ICD-10-CM   1. Malignant neoplasm of upper-outer quadrant of left breast in female, estrogen receptor positive ([place])  C50.412    Z17.0       SUMMARY OF ONCOLOGIC HISTORY: Oncology History  Malignant neoplasm of upper-outer quadrant of left breast in female, estrogen receptor positive ([place])  02/11/2021 Initial Diagnosis   left breast upper outer quadrant biopsy 02/07/2021 for a clinical T2 Ni, stage IIIA invasive ductal carcinoma, grade 3, ER 70% weak, PR 0% and HER2 negative, Ki-67: 40%   02/13/2021 Cancer Staging   Staging form: Breast, AJCC 8th Edition - Clinical stage from 02/13/2021: Stage IIIA (cT2, cN1, cM0, G3, ER+, PR-, HER2-) - Signed by [name], MD on 02/13/2021 Stage prefix: Initial diagnosis Histologic grading system: 3 grade system Laterality: Left Staged by: Pathologist and managing physician Stage used in treatment planning: Yes National guidelines used in treatment planning: Yes Type of national guideline used in treatment planning: NCCN    02/19/2021 Genetic Testing   [place] (15 genes) 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 The report date is 02/26/2021.   The CustomNext-Cancer+RNAinsight panel offered by [facility] includes sequencing and rearrangement analysis for the following 47 genes:  APC, ATM, AXIN2, BARD1, BMPR1A,  BRCA1, BRCA2, BRIP1, CDH1, CDK4, CDKN2A, CHEK2, DICER1, EPCAM, GREM1, HOXB13, MEN1, MLH1, MSH2, MSH3, MSH6, MUTYH, NBN, NF1, NF2, NTHL1, PALB2, PMS2, POLD1, POLE, PTEN, RAD51C, RAD51D, RECQL, RET, SDHA, SDHAF2, SDHB, SDHC, SDHD, SMAD4, SMARCA4, STK11, TP53, TSC1, TSC2, and VHL.  RNA data is routinely analyzed for use in variant interpretation for all genes.   03/01/2021 -  Neo-Adjuvant Chemotherapy   neoadjuvant chemotherapy will consist of paclitaxel weekly x12 followed by dose dense doxorubicin and cyclophosphamide x4     CHIEF COMPLIANT: Cycle 2 Adriamycin and Cytoxan and to establish oncology care with me  INTERVAL HISTORY: FREEDA SPIVEY is a 46 y.o. with above-mentioned history of left breast cancer currently on chemotherapy with Adriamycin and Cytoxan. She presents to the clinic today for treatment.  Her major issues with Adriamycin and Cytoxan were diffuse bone pain as well as severe acid reflux and severe hot flashes.  Did not have any nausea or vomiting.  Thoracic reflux she takes Protonix and Pepcid.  ALLERGIES:  is allergic to latex.  MEDICATIONS:  Current Outpatient Medications  Medication Sig Dispense Refill   acetaminophen (TYLENOL) 325 MG tablet Take 650 mg by mouth every 6 (six) hours as needed.     dexamethasone (DECADRON) 4 MG tablet Take 2 tablets (8 mg total) by mouth daily. Take daily for 3 days after chemo. Take with food. 30 tablet 1   dicyclomine (BENTYL) 20 MG tablet Take 1 tablet (20 mg total) by mouth every 6 (six) hours. 30 tablet 3   DULoxetine (CYMBALTA) 30 MG capsule Take 30 mg by mouth as needed.     famotidine (PEPCID) 20 MG tablet Take 1 tablet (20 mg total) by mouth 2 (two) times  daily. 60 tablet 5   gabapentin (NEURONTIN) 300 MG capsule Take 1 capsule (300 mg total) by mouth 3 (three) times daily. 90 capsule 1   ibuprofen (ADVIL) 800 MG tablet Take 800 mg by mouth.     lidocaine-prilocaine (EMLA) cream Apply to affected area once 30 g 3   LINACLOTIDE  PO Take 72 mcg by mouth daily. linzess     LORazepam (ATIVAN) 0.5 MG tablet Take 1 tablet (0.5 mg total) by mouth every 6 (six) hours as needed (Nausea or vomiting). 30 tablet 0   Melatonin 3 MG TABS Take 1 tablet (3 mg total) by mouth at bedtime. (Patient taking differently: Take 3 mg by mouth at bedtime as needed.) 60 tablet 0   ondansetron (ZOFRAN) 8 MG tablet Take 1 tablet (8 mg total) by mouth 2 (two) times daily as needed (Nausea or vomiting). 30 tablet 1   oxyCODONE (OXY IR/ROXICODONE) 5 MG immediate release tablet Take 0.5-1 tablets (2.5-5 mg total) by mouth every 4 (four) hours as needed for severe pain. 30 tablet 0   pantoprazole (PROTONIX) 40 MG tablet Take 1 tablet (40 mg total) by mouth daily. 90 tablet 1   prochlorperazine (COMPAZINE) 10 MG tablet Take 1 tablet (10 mg total) by mouth every 6 (six) hours as needed (Nausea or vomiting). 30 tablet 1   TRULANCE 3 MG TABS TAKE 1 TABLET BY MOUTH DAILY 60 tablet 3   venlafaxine XR (EFFEXOR-XR) 75 MG 24 hr capsule Take 1 capsule (75 mg total) by mouth daily with breakfast. Increase to 2 tab if tolerated after taking for 2 weeks. 90 capsule 3   No current facility-administered medications for this visit.    PHYSICAL EXAMINATION: ECOG PERFORMANCE STATUS: 1 - Symptomatic but completely ambulatory  Vitals:   06/13/21 1019  BP: 130/75  Pulse: (!) 101  Resp: 18  Temp: (!) 97.3 F (36.3 C)  SpO2: 99%   Filed Weights   06/13/21 1019  Weight: 191 lb 12.8 oz (87 kg)    LABORATORY DATA:  I have reviewed the data as listed CMP Latest Ref Rng & Units 05/29/2021 05/22/2021 05/14/2021  Glucose 70 - 99 mg/dL 107(H) 94 112(H)  BUN 6 - 20 mg/dL '15 13 15  ' Creatinine 0.44 - 1.00 mg/dL 0.81 0.77 0.89  Sodium 135 - 145 mmol/L 136 137 136  Potassium 3.5 - 5.1 mmol/L 3.9 3.7 4.1  Chloride 98 - 111 mmol/L 104 105 106  CO2 22 - 32 mmol/L '27 26 23  ' Calcium 8.9 - 10.3 mg/dL 9.1 9.1 9.1  Total Protein 6.5 - 8.1 g/dL 7.1 6.9 7.0  Total Bilirubin 0.3 -  1.2 mg/dL 0.5 0.4 0.3  Alkaline Phos 38 - 126 U/L 91 91 85  AST 15 - 41 U/L 14(L) 16 14(L)  ALT 0 - 44 U/L '12 14 12    ' Lab Results  Component Value Date   WBC 6.9 06/13/2021   HGB 9.7 (L) 06/13/2021   HCT 31.0 (L) 06/13/2021   MCV 83.3 06/13/2021   PLT 280 06/13/2021   NEUTROABS 4.1 06/13/2021    ASSESSMENT & PLAN:  Malignant neoplasm of upper-outer quadrant of left breast in female, estrogen receptor positive ([place]) left breast upper outer quadrant biopsy 02/07/2021 for a clinical T2 Ni, stage IIIA invasive ductal carcinoma, grade 3, ER 70% weak, PR 0% and HER2 negative, Ki-67: 40% Stage IIIa  Treatment plan: 1.  Neoadjuvant chemotherapy with weekly Taxol x12 followed by dose dense Adriamycin and Cytoxan  2. followed  by breast conserving surgery 3.  Followed by radiation 4.  Followed by antiestrogen therapy -------------------------------------------------------------------------------------------------------------------------------- Current treatment: Patient completed 12 cycles of weekly Taxol today is cycle 2 Adriamycin and Cytoxan Chemo toxicities: Chemo induced anemia: Monitoring today's hemoglobin 9.7 Bone pain: She takes oxycodone as needed Severe acid reflux: On Pepcid and Protonix  We discussed about return to work.  I informed her that she could return to work after surgery and recovery. I ordered the breast MRI to be done after chemo is complete. I sent a message to our navigators as well as Dr. Ninfa Linden to see her after MRI. Return to clinic in 2 weeks for cycle 3    No orders of the defined types were placed in this encounter.  The patient has a good understanding of the overall plan. she agrees with it. she will call with any problems that may develop before the next visit here.  Total time spent: 60 mins including face to face time and time spent for planning, charting and coordination of care  Rulon Eisenmenger, MD, MPH 06/13/2021  I, Thana Ates, am  acting as scribe for Dr. Nicholas Lose.  I have reviewed the above documentation for accuracy and completeness, and I agree with the above.

## 2021-06-13 ENCOUNTER — Other Ambulatory Visit: Payer: Self-pay | Admitting: Hematology and Oncology

## 2021-06-13 ENCOUNTER — Inpatient Hospital Stay: Payer: BC Managed Care – PPO

## 2021-06-13 ENCOUNTER — Inpatient Hospital Stay (HOSPITAL_BASED_OUTPATIENT_CLINIC_OR_DEPARTMENT_OTHER): Payer: BC Managed Care – PPO | Admitting: Hematology and Oncology

## 2021-06-13 ENCOUNTER — Other Ambulatory Visit: Payer: Self-pay

## 2021-06-13 ENCOUNTER — Other Ambulatory Visit: Payer: BC Managed Care – PPO

## 2021-06-13 VITALS — HR 99

## 2021-06-13 DIAGNOSIS — C50412 Malignant neoplasm of upper-outer quadrant of left female breast: Secondary | ICD-10-CM

## 2021-06-13 DIAGNOSIS — Z170 Estrogen receptor positive status [ER+]: Secondary | ICD-10-CM

## 2021-06-13 DIAGNOSIS — Z1589 Genetic susceptibility to other disease: Secondary | ICD-10-CM

## 2021-06-13 DIAGNOSIS — Z5111 Encounter for antineoplastic chemotherapy: Secondary | ICD-10-CM | POA: Diagnosis not present

## 2021-06-13 DIAGNOSIS — Z800 Family history of malignant neoplasm of digestive organs: Secondary | ICD-10-CM

## 2021-06-13 DIAGNOSIS — Z95828 Presence of other vascular implants and grafts: Secondary | ICD-10-CM

## 2021-06-13 LAB — CBC WITH DIFFERENTIAL (CANCER CENTER ONLY)
Abs Immature Granulocytes: 0.32 10*3/uL — ABNORMAL HIGH (ref 0.00–0.07)
Basophils Absolute: 0 10*3/uL (ref 0.0–0.1)
Basophils Relative: 0 %
Eosinophils Absolute: 0 10*3/uL (ref 0.0–0.5)
Eosinophils Relative: 0 %
HCT: 31 % — ABNORMAL LOW (ref 36.0–46.0)
Hemoglobin: 9.7 g/dL — ABNORMAL LOW (ref 12.0–15.0)
Immature Granulocytes: 5 %
Lymphocytes Relative: 22 %
Lymphs Abs: 1.5 10*3/uL (ref 0.7–4.0)
MCH: 26.1 pg (ref 26.0–34.0)
MCHC: 31.3 g/dL (ref 30.0–36.0)
MCV: 83.3 fL (ref 80.0–100.0)
Monocytes Absolute: 1 10*3/uL (ref 0.1–1.0)
Monocytes Relative: 14 %
Neutro Abs: 4.1 10*3/uL (ref 1.7–7.7)
Neutrophils Relative %: 59 %
Platelet Count: 280 10*3/uL (ref 150–400)
RBC: 3.72 MIL/uL — ABNORMAL LOW (ref 3.87–5.11)
RDW: 25.7 % — ABNORMAL HIGH (ref 11.5–15.5)
WBC Count: 6.9 10*3/uL (ref 4.0–10.5)
nRBC: 0.7 % — ABNORMAL HIGH (ref 0.0–0.2)

## 2021-06-13 LAB — CMP (CANCER CENTER ONLY)
ALT: 27 U/L (ref 0–44)
AST: 20 U/L (ref 15–41)
Albumin: 3.9 g/dL (ref 3.5–5.0)
Alkaline Phosphatase: 108 U/L (ref 38–126)
Anion gap: 4 — ABNORMAL LOW (ref 5–15)
BUN: 16 mg/dL (ref 6–20)
CO2: 29 mmol/L (ref 22–32)
Calcium: 9.1 mg/dL (ref 8.9–10.3)
Chloride: 103 mmol/L (ref 98–111)
Creatinine: 0.99 mg/dL (ref 0.44–1.00)
GFR, Estimated: >60 mL/min (ref >60)
Glucose, Bld: 98 mg/dL (ref 70–99)
Potassium: 4.3 mmol/L (ref 3.5–5.1)
Sodium: 136 mmol/L (ref 135–145)
Total Bilirubin: 0.3 mg/dL (ref 0.3–1.2)
Total Protein: 6.8 g/dL (ref 6.5–8.1)

## 2021-06-13 MED ORDER — HEPARIN SOD (PORK) LOCK FLUSH 100 UNIT/ML IV SOLN
500.0000 [IU] | Freq: Once | INTRAVENOUS | Status: AC | PRN
  Administered 2021-06-13: 500 [IU]

## 2021-06-13 MED ORDER — SODIUM CHLORIDE 0.9 % IV SOLN
150.0000 mg | Freq: Once | INTRAVENOUS | Status: AC
  Administered 2021-06-13: 150 mg via INTRAVENOUS
  Filled 2021-06-13: qty 150

## 2021-06-13 MED ORDER — SODIUM CHLORIDE 0.9 % IV SOLN: Freq: Once | INTRAVENOUS | Status: AC

## 2021-06-13 MED ORDER — SODIUM CHLORIDE 0.9% FLUSH
10.0000 mL | INTRAVENOUS | Status: AC | PRN
  Administered 2021-06-13: 10 mL

## 2021-06-13 MED ORDER — PANTOPRAZOLE SODIUM 40 MG PO TBEC: 40.0000 mg | DELAYED_RELEASE_TABLET | Freq: Every day | ORAL | 3 refills | Status: DC

## 2021-06-13 MED ORDER — DOXORUBICIN HCL CHEMO IV INJECTION 2 MG/ML
60.0000 mg/m2 | Freq: Once | INTRAVENOUS | Status: AC
  Administered 2021-06-13: 126 mg via INTRAVENOUS
  Filled 2021-06-13: qty 63

## 2021-06-13 MED ORDER — PALONOSETRON HCL INJECTION 0.25 MG/5ML
0.2500 mg | Freq: Once | INTRAVENOUS | Status: AC
  Administered 2021-06-13: 0.25 mg via INTRAVENOUS
  Filled 2021-06-13: qty 5

## 2021-06-13 MED ORDER — VENLAFAXINE HCL ER 150 MG PO CP24: 150.0000 mg | ORAL_CAPSULE | Freq: Every day | ORAL | 3 refills | Status: DC

## 2021-06-13 MED ORDER — FAMOTIDINE 20 MG PO TABS: 20.0000 mg | ORAL_TABLET | Freq: Two times a day (BID) | ORAL | 5 refills | Status: DC

## 2021-06-13 MED ORDER — SODIUM CHLORIDE 0.9 % IV SOLN
10.0000 mg | Freq: Once | INTRAVENOUS | Status: AC
  Administered 2021-06-13: 10 mg via INTRAVENOUS
  Filled 2021-06-13: qty 10

## 2021-06-13 MED ORDER — SODIUM CHLORIDE 0.9% FLUSH
10.0000 mL | INTRAVENOUS | Status: DC | PRN
  Administered 2021-06-13: 10 mL

## 2021-06-13 MED ORDER — SODIUM CHLORIDE 0.9 % IV SOLN
600.0000 mg/m2 | Freq: Once | INTRAVENOUS | Status: AC
  Administered 2021-06-13: 1260 mg via INTRAVENOUS
  Filled 2021-06-13: qty 63

## 2021-06-13 NOTE — Patient Instructions (Signed)
[facility] ONCOLOGY  Discharge Instructions: Thank you for choosing [facility] to provide your oncology and hematology care.   If you have a lab appointment with the [facility], please go directly to the [facility] and check in at the registration area.   Wear comfortable clothing and clothing appropriate for easy access to any Portacath or PICC line.   We strive to give you quality time with your provider. You may need to reschedule your appointment if you arrive late (15 or more minutes).  Arriving late affects you and other patients whose appointments are after yours.  Also, if you miss three or more appointments without notifying the office, you may be dismissed from the clinic at the providers discretion.      For prescription refill requests, have your pharmacy contact our office and allow 72 hours for refills to be completed.    Today you received the following chemotherapy and/or immunotherapy agents adriamycin and cytoxan      To help prevent nausea and vomiting after your treatment, we encourage you to take your nausea medication as directed.  BELOW ARE SYMPTOMS THAT SHOULD BE REPORTED IMMEDIATELY: *FEVER GREATER THAN 100.4 F (38 C) OR HIGHER *CHILLS OR SWEATING *NAUSEA AND VOMITING THAT IS NOT CONTROLLED WITH YOUR NAUSEA MEDICATION *UNUSUAL SHORTNESS OF BREATH *UNUSUAL BRUISING OR BLEEDING *URINARY PROBLEMS (pain or burning when urinating, or frequent urination) *BOWEL PROBLEMS (unusual diarrhea, constipation, pain near the anus) TENDERNESS IN MOUTH AND THROAT WITH OR WITHOUT PRESENCE OF ULCERS (sore throat, sores in mouth, or a toothache) UNUSUAL RASH, SWELLING OR PAIN  UNUSUAL VAGINAL DISCHARGE OR ITCHING   Items with * indicate a potential emergency and should be followed up as soon as possible or go to the Emergency Department if any problems should occur.  Please show the CHEMOTHERAPY ALERT CARD or IMMUNOTHERAPY ALERT CARD at  check-in to the Emergency Department and triage nurse.  Should you have questions after your visit or need to cancel or reschedule your appointment, please contact [facility]  Dept: [phone]  and follow the prompts.  Office hours are 8:00 a.m. to 4:30 p.m. Monday - Friday. Please note that voicemails left after 4:00 p.m. may not be returned until the following business day.  We are closed weekends and major holidays. You have access to a nurse at all times for urgent questions. Please call the main number to the clinic Dept: [phone] and follow the prompts.   For any non-urgent questions, you may also contact your provider using MyChart. We now offer e-Visits for anyone 54 and older to request care online for non-urgent symptoms. For details visit [email or web address].   Also download the MyChart app! Go to the app store, search "MyChart", open the app, select [facility], and log in with your MyChart username and password.  Due to Covid, a mask is required upon entering the hospital/clinic. If you do not have a mask, one will be given to you upon arrival. For doctor visits, patients may have 1 support person aged 40 or older with them. For treatment visits, patients cannot have anyone with them due to current Covid guidelines and our immunocompromised population.

## 2021-06-14 ENCOUNTER — Encounter: Payer: Self-pay | Admitting: Licensed Clinical Social Worker

## 2021-06-14 ENCOUNTER — Telehealth: Payer: Self-pay | Admitting: *Deleted

## 2021-06-14 NOTE — Progress Notes (Signed)
[place] Work  Clinical Social Work was referred by Therapist, sports for assistance with food and gas.  Clinical Social Worker contacted patient by phone.   Per pt, she is waiting on short-term disability so gas money is tight. Pt will sign up for [employer]- first cards given at appt tomorrow 06/15/21 along with bag of food and household items.   Pt is currently receiving some support with supplies from [facility] services. CSW encouraged pt to reach out to them as well for travel support as they often provide reimbursement.  No other needs at this time.   [place], [place] Worker [employer]

## 2021-06-14 NOTE — Telephone Encounter (Signed)
Late entry for 06/13/2021.  Connected with [name] in treatment area to obtain signed release for form completion and records if needed.    See 06/10/2021 telephone call for this nurse.  [name] asked this nurse:  "Is there anything [facility] office able to do to assist patients with food."   No options through [facility].   Message left with patient request for Patient [facility] Family Services [phone]). Patient awaiting return call however this nurse provided phone number to call as needed.

## 2021-06-15 ENCOUNTER — Inpatient Hospital Stay: Payer: BC Managed Care – PPO

## 2021-06-15 ENCOUNTER — Other Ambulatory Visit: Payer: Self-pay

## 2021-06-15 VITALS — BP 129/74 | HR 115 | Temp 98.1°F | Resp 18

## 2021-06-15 DIAGNOSIS — Z5111 Encounter for antineoplastic chemotherapy: Secondary | ICD-10-CM | POA: Diagnosis not present

## 2021-06-15 DIAGNOSIS — Z170 Estrogen receptor positive status [ER+]: Secondary | ICD-10-CM

## 2021-06-15 MED ORDER — PEGFILGRASTIM-CBQV 6 MG/0.6ML ~~LOC~~ SOSY
6.0000 mg | PREFILLED_SYRINGE | Freq: Once | SUBCUTANEOUS | Status: AC
  Administered 2021-06-15: 6 mg via SUBCUTANEOUS

## 2021-06-15 NOTE — Patient Instructions (Signed)

## 2021-06-19 ENCOUNTER — Other Ambulatory Visit: Payer: Self-pay | Admitting: Hematology and Oncology

## 2021-06-19 ENCOUNTER — Encounter: Payer: Self-pay | Admitting: *Deleted

## 2021-06-19 ENCOUNTER — Other Ambulatory Visit: Payer: Self-pay | Admitting: Adult Health

## 2021-06-19 DIAGNOSIS — C50412 Malignant neoplasm of upper-outer quadrant of left female breast: Secondary | ICD-10-CM

## 2021-06-19 MED ORDER — OXYCODONE HCL 5 MG PO TABS: 2.5000 mg | ORAL_TABLET | ORAL | 0 refills | Status: DC | PRN

## 2021-06-19 MED ORDER — LORAZEPAM 0.5 MG PO TABS: 0.5000 mg | ORAL_TABLET | Freq: Four times a day (QID) | ORAL | 0 refills | Status: DC | PRN

## 2021-06-21 ENCOUNTER — Other Ambulatory Visit: Payer: Self-pay | Admitting: *Deleted

## 2021-06-21 ENCOUNTER — Encounter: Payer: Self-pay | Admitting: Adult Health

## 2021-06-21 MED ORDER — MAGIC MOUTHWASH W/LIDOCAINE: 5.0000 mL | Freq: Four times a day (QID) | ORAL | 1 refills | Status: DC | PRN

## 2021-06-21 NOTE — Progress Notes (Signed)
Received call from pt with complaint of mouth sores x2 days.  Pt denies fever at this time.  Per MD pt to be prescribed Magic Mouthwash with Lidocaine. Prescription sent to pharmacy on file. Pt educated to call the office on Monday if symptoms do not improve.  Pt verbalized understanding.

## 2021-06-26 MED FILL — Fosaprepitant Dimeglumine For IV Infusion 150 MG (Base Eq): INTRAVENOUS | Qty: 5 | Status: AC

## 2021-06-26 MED FILL — Dexamethasone Sodium Phosphate Inj 100 MG/10ML: INTRAMUSCULAR | Qty: 1 | Status: AC

## 2021-06-26 NOTE — Progress Notes (Signed)
Patient Care Team: [name], NP as PCP - General (Adult Health Nurse Practitioner) Coralie Keens, MD as Consulting Physician (General Surgery) Eppie Gibson, MD as Attending Physician (Radiation Oncology) Rockwell Germany, RN as Oncology Nurse Navigator Mauro Kaufmann, RN as Oncology Nurse Navigator Causey, Charlestine Massed, NP as Nurse Practitioner (Hematology and Oncology) Nicholas Lose, MD as Consulting Physician (Hematology and Oncology)  DIAGNOSIS:    ICD-10-CM   1. Malignant neoplasm of upper-outer quadrant of left breast in female, estrogen receptor positive ([place])  C50.412    Z17.0       SUMMARY OF ONCOLOGIC HISTORY: Oncology History  Malignant neoplasm of upper-outer quadrant of left breast in female, estrogen receptor positive ([place])  02/11/2021 Initial Diagnosis   left breast upper outer quadrant biopsy 02/07/2021 for a clinical T2 Ni, stage IIIA invasive ductal carcinoma, grade 3, ER 70% weak, PR 0% and HER2 negative, Ki-67: 40%   02/13/2021 Cancer Staging   Staging form: Breast, AJCC 8th Edition - Clinical stage from 02/13/2021: Stage IIIA (cT2, cN1, cM0, G3, ER+, PR-, HER2-) - Signed by [name], MD on 02/13/2021 Stage prefix: Initial diagnosis Histologic grading system: 3 grade system Laterality: Left Staged by: Pathologist and managing physician Stage used in treatment planning: Yes National guidelines used in treatment planning: Yes Type of national guideline used in treatment planning: NCCN    02/19/2021 Genetic Testing   [place] (67 genes) 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 Ms. 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 The report date is 02/26/2021.   The CustomNext-Cancer+RNAinsight panel offered by [facility] includes sequencing and rearrangement analysis for the following 47 genes:  APC, ATM, AXIN2, BARD1, BMPR1A,  BRCA1, BRCA2, BRIP1, CDH1, CDK4, CDKN2A, CHEK2, DICER1, EPCAM, GREM1, HOXB13, MEN1, MLH1, MSH2, MSH3, MSH6, MUTYH, NBN, NF1, NF2, NTHL1, PALB2, PMS2, POLD1, POLE, PTEN, RAD51C, RAD51D, RECQL, RET, SDHA, SDHAF2, SDHB, SDHC, SDHD, SMAD4, SMARCA4, STK11, TP53, TSC1, TSC2, and VHL.  RNA data is routinely analyzed for use in variant interpretation for all genes.   03/01/2021 -  Neo-Adjuvant Chemotherapy   neoadjuvant chemotherapy will consist of paclitaxel weekly x12 followed by dose dense doxorubicin and cyclophosphamide x4     CHIEF COMPLIANT: Cycle 3 Adriamycin and Cytoxan    INTERVAL HISTORY: Catherine Ellison is a 46 y.o. with above-mentioned history of left breast cancer currently on chemotherapy with Adriamycin and Cytoxan. She presents to the clinic today for treatment.   ALLERGIES:  is allergic to latex.  MEDICATIONS:  Current Outpatient Medications  Medication Sig Dispense Refill   acetaminophen (TYLENOL) 325 MG tablet Take 650 mg by mouth every 6 (six) hours as needed.     dexamethasone (DECADRON) 4 MG tablet Take 2 tablets (8 mg total) by mouth daily. Take daily for 3 days after chemo. Take with food. 30 tablet 1   dicyclomine (BENTYL) 20 MG tablet Take 1 tablet (20 mg total) by mouth every 6 (six) hours. 30 tablet 3   DULoxetine (CYMBALTA) 30 MG capsule Take 30 mg by mouth as needed.     famotidine (PEPCID) 20 MG tablet Take 1 tablet (20 mg total) by mouth 2 (two) times daily. 60 tablet 5   gabapentin (NEURONTIN) 300 MG capsule Take 1 capsule (300 mg total) by mouth 3 (three) times daily. 90 capsule 1   ibuprofen (ADVIL) 800 MG tablet Take 800 mg by mouth.  lidocaine-prilocaine (EMLA) cream Apply to affected area once 30 g 3   LINACLOTIDE PO Take 72 mcg by mouth daily. linzess     LORazepam (ATIVAN) 0.5 MG tablet Take 1 tablet (0.5 mg total) by mouth every 6 (six) hours as needed (Nausea or vomiting). 30 tablet 0   magic mouthwash w/lidocaine SOLN Take 5 mLs by mouth 4 (four)  times daily as needed for mouth pain. 240 mL 1   Melatonin 3 MG TABS Take 1 tablet (3 mg total) by mouth at bedtime. (Patient taking differently: Take 3 mg by mouth at bedtime as needed.) 60 tablet 0   ondansetron (ZOFRAN) 8 MG tablet Take 1 tablet (8 mg total) by mouth 2 (two) times daily as needed (Nausea or vomiting). 30 tablet 1   oxyCODONE (OXY IR/ROXICODONE) 5 MG immediate release tablet Take 0.5-1 tablets (2.5-5 mg total) by mouth every 4 (four) hours as needed for severe pain. 30 tablet 0   pantoprazole (PROTONIX) 40 MG tablet Take 1 tablet (40 mg total) by mouth daily. 90 tablet 3   prochlorperazine (COMPAZINE) 10 MG tablet Take 1 tablet (10 mg total) by mouth every 6 (six) hours as needed (Nausea or vomiting). 30 tablet 1   TRULANCE 3 MG TABS TAKE 1 TABLET BY MOUTH DAILY 60 tablet 3   venlafaxine XR (EFFEXOR-XR) 150 MG 24 hr capsule Take 1 capsule (150 mg total) by mouth daily with breakfast. Take 1 tablet daily 90 capsule 3   No current facility-administered medications for this visit.    PHYSICAL EXAMINATION: ECOG PERFORMANCE STATUS: 1 - Symptomatic but completely ambulatory  Vitals:   06/27/21 1027  BP: 127/83  Pulse: (!) 104  Resp: 19  Temp: (!) 97.3 F (36.3 C)  SpO2: 97%   Filed Weights   06/27/21 1027  Weight: 193 lb 1.6 oz (87.6 kg)    LABORATORY DATA:  I have reviewed the data as listed CMP Latest Ref Rng & Units 06/13/2021 05/29/2021 05/22/2021  Glucose 70 - 99 mg/dL 98 107(H) 94  BUN 6 - 20 mg/dL _0 Creatinine 0.44 - 1.00 mg/dL 0.99 0.81 0.77  Sodium 135 - 145 mmol/L 136 136 137  Potassium 3.5 - 5.1 mmol/L 4.3 3.9 3.7  Chloride 98 - 111 mmol/L 103 104 105  CO2 22 - 32 mmol/L _1 Calcium 8.9 - 10.3 mg/dL 9.1 9.1 9.1  Total Protein 6.5 - 8.1 g/dL 6.8 7.1 6.9  Total Bilirubin 0.3 - 1.2 mg/dL 0.3 0.5 0.4  Alkaline Phos 38 - 126 U/L 108 91 91  AST 15 - 41 U/L 20 14(L) 16  ALT 0 - 44 U/L _2 Lab Results  Component Value Date   WBC 11.7  (H) 06/27/2021   HGB 9.0 (L) 06/27/2021   HCT 28.3 (L) 06/27/2021   MCV 84.7 06/27/2021   PLT 187 06/27/2021   NEUTROABS 8.7 (H) 06/27/2021    ASSESSMENT & PLAN:  Malignant neoplasm of upper-outer quadrant of left breast in female, estrogen receptor positive ([place]) left breast upper outer quadrant biopsy 02/07/2021 for a clinical T2 Ni, stage IIIA invasive ductal carcinoma, grade 3, 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  Neoadjuvant chemotherapy with weekly Taxol x12 followed by dose dense Adriamycin and Cytoxan  2. followed by breast conserving surgery 3.  Followed by radiation 4.  Followed by antiestrogen therapy -------------------------------------------------------------------------------------------------------------------------------- Current treatment: Patient completed 12 cycles of  weekly Taxol today is cycle 3 Adriamycin and Cytoxan Chemo toxicities: Chemo induced anemia: Monitoring today's hemoglobin 9 Bone pain: She takes oxycodone as needed Severe acid reflux: On Pepcid and Protonix   We discussed about return to work.  I informed her that she could return to work after surgery and recovery. I ordered the breast MRI to be done after chemo is complete. I sent a message to our navigators as well as Dr. Ninfa Linden to see her after MRI. Return to clinic in 2 weeks for cycle 4    No orders of the defined types were placed in this encounter.  The patient has a good understanding of the overall plan. she agrees with it. she will call with any problems that may develop before the next visit here.  Total time spent: 30 mins including face to face time and time spent for planning, charting and coordination of care  Rulon Eisenmenger, MD, MPH 06/27/2021  I, Thana Ates, am acting as scribe for Dr. Nicholas Lose.  I have reviewed the above documentation for accuracy and completeness, and I agree with the above.

## 2021-06-27 ENCOUNTER — Inpatient Hospital Stay: Payer: BC Managed Care – PPO

## 2021-06-27 ENCOUNTER — Inpatient Hospital Stay (HOSPITAL_BASED_OUTPATIENT_CLINIC_OR_DEPARTMENT_OTHER): Payer: BC Managed Care – PPO | Admitting: Hematology and Oncology

## 2021-06-27 ENCOUNTER — Encounter: Payer: Self-pay | Admitting: *Deleted

## 2021-06-27 ENCOUNTER — Inpatient Hospital Stay: Payer: BC Managed Care – PPO | Attending: Oncology

## 2021-06-27 ENCOUNTER — Telehealth: Payer: Self-pay | Admitting: *Deleted

## 2021-06-27 ENCOUNTER — Other Ambulatory Visit: Payer: Self-pay

## 2021-06-27 VITALS — HR 93

## 2021-06-27 DIAGNOSIS — D6481 Anemia due to antineoplastic chemotherapy: Secondary | ICD-10-CM | POA: Diagnosis not present

## 2021-06-27 DIAGNOSIS — Z5111 Encounter for antineoplastic chemotherapy: Secondary | ICD-10-CM | POA: Diagnosis not present

## 2021-06-27 DIAGNOSIS — C50412 Malignant neoplasm of upper-outer quadrant of left female breast: Secondary | ICD-10-CM | POA: Diagnosis not present

## 2021-06-27 DIAGNOSIS — Z5189 Encounter for other specified aftercare: Secondary | ICD-10-CM | POA: Diagnosis not present

## 2021-06-27 DIAGNOSIS — Z170 Estrogen receptor positive status [ER+]: Secondary | ICD-10-CM | POA: Diagnosis not present

## 2021-06-27 DIAGNOSIS — Z95828 Presence of other vascular implants and grafts: Secondary | ICD-10-CM

## 2021-06-27 DIAGNOSIS — Z79899 Other long term (current) drug therapy: Secondary | ICD-10-CM | POA: Diagnosis not present

## 2021-06-27 LAB — CBC WITH DIFFERENTIAL (CANCER CENTER ONLY)
Abs Immature Granulocytes: 0.59 10*3/uL — ABNORMAL HIGH (ref 0.00–0.07)
Basophils Absolute: 0 10*3/uL (ref 0.0–0.1)
Basophils Relative: 0 %
Eosinophils Absolute: 0 10*3/uL (ref 0.0–0.5)
Eosinophils Relative: 0 %
HCT: 28.3 % — ABNORMAL LOW (ref 36.0–46.0)
Hemoglobin: 9 g/dL — ABNORMAL LOW (ref 12.0–15.0)
Immature Granulocytes: 5 %
Lymphocytes Relative: 9 %
Lymphs Abs: 1 10*3/uL (ref 0.7–4.0)
MCH: 26.9 pg (ref 26.0–34.0)
MCHC: 31.8 g/dL (ref 30.0–36.0)
MCV: 84.7 fL (ref 80.0–100.0)
Monocytes Absolute: 1.4 10*3/uL — ABNORMAL HIGH (ref 0.1–1.0)
Monocytes Relative: 12 %
Neutro Abs: 8.7 10*3/uL — ABNORMAL HIGH (ref 1.7–7.7)
Neutrophils Relative %: 74 %
Platelet Count: 187 10*3/uL (ref 150–400)
RBC: 3.34 MIL/uL — ABNORMAL LOW (ref 3.87–5.11)
RDW: 25.4 % — ABNORMAL HIGH (ref 11.5–15.5)
WBC Count: 11.7 10*3/uL — ABNORMAL HIGH (ref 4.0–10.5)
nRBC: 1.1 % — ABNORMAL HIGH (ref 0.0–0.2)

## 2021-06-27 LAB — CMP (CANCER CENTER ONLY)
ALT: 18 U/L (ref 0–44)
AST: 16 U/L (ref 15–41)
Albumin: 3.9 g/dL (ref 3.5–5.0)
Alkaline Phosphatase: 124 U/L (ref 38–126)
Anion gap: 7 (ref 5–15)
BUN: 12 mg/dL (ref 6–20)
CO2: 25 mmol/L (ref 22–32)
Calcium: 9.2 mg/dL (ref 8.9–10.3)
Chloride: 105 mmol/L (ref 98–111)
Creatinine: 0.78 mg/dL (ref 0.44–1.00)
GFR, Estimated: >60 mL/min (ref >60)
Glucose, Bld: 119 mg/dL — ABNORMAL HIGH (ref 70–99)
Potassium: 4 mmol/L (ref 3.5–5.1)
Sodium: 137 mmol/L (ref 135–145)
Total Bilirubin: 0.3 mg/dL (ref 0.3–1.2)
Total Protein: 6.8 g/dL (ref 6.5–8.1)

## 2021-06-27 MED ORDER — SODIUM CHLORIDE 0.9 % IV SOLN: Freq: Once | INTRAVENOUS | Status: AC

## 2021-06-27 MED ORDER — LINACLOTIDE 72 MCG PO CAPS: 72.0000 ug | ORAL_CAPSULE | Freq: Every day | ORAL | 3 refills | Status: DC

## 2021-06-27 MED ORDER — HEPARIN SOD (PORK) LOCK FLUSH 100 UNIT/ML IV SOLN
500.0000 [IU] | Freq: Once | INTRAVENOUS | Status: AC | PRN
  Administered 2021-06-27: 500 [IU]

## 2021-06-27 MED ORDER — PALONOSETRON HCL INJECTION 0.25 MG/5ML
0.2500 mg | Freq: Once | INTRAVENOUS | Status: AC
  Administered 2021-06-27: 0.25 mg via INTRAVENOUS
  Filled 2021-06-27: qty 5

## 2021-06-27 MED ORDER — SODIUM CHLORIDE 0.9 % IV SOLN
600.0000 mg/m2 | Freq: Once | INTRAVENOUS | Status: AC
  Administered 2021-06-27: 1260 mg via INTRAVENOUS
  Filled 2021-06-27: qty 63

## 2021-06-27 MED ORDER — SODIUM CHLORIDE 0.9% FLUSH
10.0000 mL | INTRAVENOUS | Status: DC | PRN
  Administered 2021-06-27: 10 mL

## 2021-06-27 MED ORDER — SODIUM CHLORIDE 0.9 % IV SOLN
150.0000 mg | Freq: Once | INTRAVENOUS | Status: AC
  Administered 2021-06-27: 150 mg via INTRAVENOUS
  Filled 2021-06-27: qty 150

## 2021-06-27 MED ORDER — SODIUM CHLORIDE 0.9 % IV SOLN
10.0000 mg | Freq: Once | INTRAVENOUS | Status: AC
  Administered 2021-06-27: 10 mg via INTRAVENOUS
  Filled 2021-06-27: qty 10

## 2021-06-27 MED ORDER — DOXORUBICIN HCL CHEMO IV INJECTION 2 MG/ML
60.0000 mg/m2 | Freq: Once | INTRAVENOUS | Status: AC
  Administered 2021-06-27: 126 mg via INTRAVENOUS
  Filled 2021-06-27: qty 63

## 2021-06-27 MED ORDER — GABAPENTIN 400 MG PO CAPS: 400.0000 mg | ORAL_CAPSULE | Freq: Three times a day (TID) | ORAL | 3 refills | Status: DC

## 2021-06-27 MED ORDER — SODIUM CHLORIDE 0.9% FLUSH
10.0000 mL | Freq: Once | INTRAVENOUS | Status: AC
  Administered 2021-06-27: 10 mL

## 2021-06-27 NOTE — Patient Instructions (Signed)
[facility] ONCOLOGY  Discharge Instructions: Thank you for choosing [facility] to provide your oncology and hematology care.   If you have a lab appointment with the [facility], please go directly to the [facility] and check in at the registration area.   Wear comfortable clothing and clothing appropriate for easy access to any Portacath or PICC line.   We strive to give you quality time with your provider. You may need to reschedule your appointment if you arrive late (15 or more minutes).  Arriving late affects you and other patients whose appointments are after yours.  Also, if you miss three or more appointments without notifying the office, you may be dismissed from the clinic at the providers discretion.      For prescription refill requests, have your pharmacy contact our office and allow 72 hours for refills to be completed.    Today you received the following chemotherapy and/or immunotherapy agents adriamycin and cytoxan      To help prevent nausea and vomiting after your treatment, we encourage you to take your nausea medication as directed.  BELOW ARE SYMPTOMS THAT SHOULD BE REPORTED IMMEDIATELY: *FEVER GREATER THAN 100.4 F (38 C) OR HIGHER *CHILLS OR SWEATING *NAUSEA AND VOMITING THAT IS NOT CONTROLLED WITH YOUR NAUSEA MEDICATION *UNUSUAL SHORTNESS OF BREATH *UNUSUAL BRUISING OR BLEEDING *URINARY PROBLEMS (pain or burning when urinating, or frequent urination) *BOWEL PROBLEMS (unusual diarrhea, constipation, pain near the anus) TENDERNESS IN MOUTH AND THROAT WITH OR WITHOUT PRESENCE OF ULCERS (sore throat, sores in mouth, or a toothache) UNUSUAL RASH, SWELLING OR PAIN  UNUSUAL VAGINAL DISCHARGE OR ITCHING   Items with * indicate a potential emergency and should be followed up as soon as possible or go to the Emergency Department if any problems should occur.  Please show the CHEMOTHERAPY ALERT CARD or IMMUNOTHERAPY ALERT CARD at  check-in to the Emergency Department and triage nurse.  Should you have questions after your visit or need to cancel or reschedule your appointment, please contact [facility]  Dept: [phone]  and follow the prompts.  Office hours are 8:00 a.m. to 4:30 p.m. Monday - Friday. Please note that voicemails left after 4:00 p.m. may not be returned until the following business day.  We are closed weekends and major holidays. You have access to a nurse at all times for urgent questions. Please call the main number to the clinic Dept: [phone] and follow the prompts.   For any non-urgent questions, you may also contact your provider using MyChart. We now offer e-Visits for anyone 48 and older to request care online for non-urgent symptoms. For details visit [email or web address].   Also download the MyChart app! Go to the app store, search "MyChart", open the app, select [facility], and log in with your MyChart username and password.  Due to Covid, a mask is required upon entering the hospital/clinic. If you do not have a mask, one will be given to you upon arrival. For doctor visits, patients may have 1 support person aged 9 or older with them. For treatment visits, patients cannot have anyone with them due to current Covid guidelines and our immunocompromised population.

## 2021-06-27 NOTE — Telephone Encounter (Signed)
Gave message to patient that she is scheduled for her MRI 2/24 and Dr. Ninfa Linden for 3/3 at 950.

## 2021-06-27 NOTE — Assessment & Plan Note (Signed)
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  Neoadjuvant chemotherapy with weekly Taxol x12 followed by dose dense Adriamycin and Cytoxan  2. followed by breast conserving surgery 3.  Followed by radiation 4.  Followed by antiestrogen therapy -------------------------------------------------------------------------------------------------------------------------------- Current treatment: Patient completed 12 cycles of weekly Taxol today is cycle 3 Adriamycin and Cytoxan Chemo toxicities: 1. Chemo induced anemia: Monitoring today's hemoglobin 9.7 2. Bone pain: She takes oxycodone as needed 3. Severe acid reflux: On Pepcid and Protonix  We discussed about return to work.  I informed her that she could return to work after surgery and recovery. I ordered the breast MRI to be done after chemo is complete. I sent a message to our navigators as well as Dr. Ninfa Linden to see her after MRI. Return to clinic in 2 weeks for cycle 4

## 2021-06-28 ENCOUNTER — Encounter: Payer: Self-pay | Admitting: Adult Health

## 2021-06-29 ENCOUNTER — Inpatient Hospital Stay: Payer: BC Managed Care – PPO

## 2021-06-29 ENCOUNTER — Other Ambulatory Visit: Payer: Self-pay

## 2021-06-29 VITALS — BP 129/91 | HR 95 | Temp 98.1°F | Resp 19

## 2021-06-29 DIAGNOSIS — Z5111 Encounter for antineoplastic chemotherapy: Secondary | ICD-10-CM | POA: Diagnosis not present

## 2021-06-29 DIAGNOSIS — C50412 Malignant neoplasm of upper-outer quadrant of left female breast: Secondary | ICD-10-CM

## 2021-06-29 MED ORDER — PEGFILGRASTIM-CBQV 6 MG/0.6ML ~~LOC~~ SOSY
6.0000 mg | PREFILLED_SYRINGE | Freq: Once | SUBCUTANEOUS | Status: AC
  Administered 2021-06-29: 6 mg via SUBCUTANEOUS
  Filled 2021-06-29: qty 0.6

## 2021-06-29 NOTE — Patient Instructions (Signed)

## 2021-07-04 ENCOUNTER — Other Ambulatory Visit: Payer: Self-pay | Admitting: Oncology

## 2021-07-08 ENCOUNTER — Other Ambulatory Visit: Payer: Self-pay

## 2021-07-08 ENCOUNTER — Encounter: Payer: Self-pay | Admitting: Hematology and Oncology

## 2021-07-08 ENCOUNTER — Telehealth: Payer: Self-pay

## 2021-07-08 MED ORDER — DULOXETINE HCL 20 MG PO CPEP: 40.0000 mg | ORAL_CAPSULE | Freq: Every day | ORAL | 6 refills | Status: DC

## 2021-07-08 NOTE — Telephone Encounter (Signed)
Called pt regarding mychart message. Pt states Gabapentin dose change has not helped and she took 800 mg gabapentin to see if that would help and it did not. Pt reports having to take onyCODONE for the pain. Pt is asking for advice on how to manage. Advised pt I would be back in touch after consulting with MD.

## 2021-07-10 MED FILL — Dexamethasone Sodium Phosphate Inj 100 MG/10ML: INTRAMUSCULAR | Qty: 1 | Status: AC

## 2021-07-10 MED FILL — Fosaprepitant Dimeglumine For IV Infusion 150 MG (Base Eq): INTRAVENOUS | Qty: 5 | Status: AC

## 2021-07-10 NOTE — Progress Notes (Signed)
Patient Care Team: [name], NP as PCP - General (Adult Health Nurse Practitioner) Coralie Keens, MD as Consulting Physician (General Surgery) Eppie Gibson, MD as Attending Physician (Radiation Oncology) Rockwell Germany, RN as Oncology Nurse Navigator Mauro Kaufmann, RN as Oncology Nurse Navigator Causey, Charlestine Massed, NP as Nurse Practitioner (Hematology and Oncology) Nicholas Lose, MD as Consulting Physician (Hematology and Oncology)  DIAGNOSIS:    ICD-10-CM   1. Malignant neoplasm of upper-outer quadrant of left breast in female, estrogen receptor positive ([place])  C50.412    Z17.0       SUMMARY OF ONCOLOGIC HISTORY: Oncology History  Malignant neoplasm of upper-outer quadrant of left breast in female, estrogen receptor positive ([place])  02/11/2021 Initial Diagnosis   left breast upper outer quadrant biopsy 02/07/2021 for a clinical T2 Ni, stage IIIA invasive ductal carcinoma, grade 3, ER 70% weak, PR 0% and HER2 negative, Ki-67: 40%   02/13/2021 Cancer Staging   Staging form: Breast, AJCC 8th Edition - Clinical stage from 02/13/2021: Stage IIIA (cT2, cN1, cM0, G3, ER+, PR-, HER2-) - Signed by [name], MD on 02/13/2021 Stage prefix: Initial diagnosis Histologic grading system: 3 grade system Laterality: Left Staged by: Pathologist and managing physician Stage used in treatment planning: Yes National guidelines used in treatment planning: Yes Type of national guideline used in treatment planning: NCCN    02/19/2021 Genetic Testing   [place] (104 genes) 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 The report date is 02/26/2021.   The Custom[employer]-Cancer+RNAinsight panel offered by [facility] includes sequencing and rearrangement analysis for the following 47 genes:  APC, ATM, AXIN2, BARD1, BMPR1A,  BRCA1, BRCA2, BRIP1, CDH1, CDK4, CDKN2A, CHEK2, DICER1, EPCAM, GREM1, HOXB13, MEN1, MLH1, MSH2, MSH3, MSH6, MUTYH, NBN, NF1, NF2, NTHL1, PALB2, PMS2, POLD1, POLE, PTEN, RAD51C, RAD51D, RECQL, RET, SDHA, SDHAF2, SDHB, SDHC, SDHD, SMAD4, SMARCA4, STK11, TP53, TSC1, TSC2, and VHL.  RNA data is routinely analyzed for use in variant interpretation for all genes.   03/01/2021 -  Neo-Adjuvant Chemotherapy   neoadjuvant chemotherapy will consist of paclitaxel weekly x12 followed by dose dense doxorubicin and cyclophosphamide x4     CHIEF COMPLIANT: Cycle 4 Adriamycin and Cytoxan    INTERVAL HISTORY: Catherine Ellison is a 46 y.o. with above-mentioned history of left breast cancer currently on chemotherapy with Adriamycin and Cytoxan. She presents to the clinic today for treatment.  She is complaining of shortness of breath at rest as well as tachycardia and midepigastric chest pain intermittently.  She took antacids and did not help her.  She is short of breath to climbing steps or even walking a few steps.  She is short of breath even to getting dressed up.  There is no leg swelling.  ALLERGIES:  is allergic to latex.  MEDICATIONS:  Current Outpatient Medications  Medication Sig Dispense Refill   acetaminophen (TYLENOL) 325 MG tablet Take 650 mg by mouth every 6 (six) hours as needed.     dexamethasone (DECADRON) 4 MG tablet Take 2 tablets (8 mg total) by mouth daily. Take daily for 3 days after chemo. Take with food. 30 tablet 1   dicyclomine (BENTYL) 20 MG tablet Take 1 tablet (20 mg total) by mouth every 6 (six) hours. 30 tablet 3   DULoxetine (CYMBALTA) 20 MG capsule Take 2 capsules (40 mg total) by mouth daily. [place]  capsule 6   famotidine (PEPCID) 20 MG tablet Take 1 tablet (20 mg total) by mouth 2 (two) times daily. 60 tablet 5   gabapentin (NEURONTIN) 400 MG capsule Take 1 capsule (400 mg total) by mouth 3 (three) times daily. 180 capsule 3   ibuprofen (ADVIL) 800 MG tablet Take 800 mg by  mouth.     lidocaine-prilocaine (EMLA) cream Apply to affected area once 30 g 3   linaclotide (LINZESS) 72 MCG capsule Take 1 capsule (72 mcg total) by mouth daily. linzess 90 capsule 3   LORazepam (ATIVAN) 0.5 MG tablet Take 1 tablet (0.5 mg total) by mouth every 6 (six) hours as needed (Nausea or vomiting). 30 tablet 0   magic mouthwash w/lidocaine SOLN Take 5 mLs by mouth 4 (four) times daily as needed for mouth pain. 240 mL 1   Melatonin 3 MG TABS Take 1 tablet (3 mg total) by mouth at bedtime. (Patient taking differently: Take 3 mg by mouth at bedtime as needed.) 60 tablet 0   ondansetron (ZOFRAN) 8 MG tablet Take 1 tablet (8 mg total) by mouth 2 (two) times daily as needed (Nausea or vomiting). 30 tablet 1   oxyCODONE (OXY IR/ROXICODONE) 5 MG immediate release tablet Take 0.5-1 tablets (2.5-5 mg total) by mouth every 4 (four) hours as needed for severe pain. 30 tablet 0   pantoprazole (PROTONIX) 40 MG tablet Take 1 tablet (40 mg total) by mouth daily. 90 tablet 3   prochlorperazine (COMPAZINE) 10 MG tablet Take 1 tablet (10 mg total) by mouth every 6 (six) hours as needed (Nausea or vomiting). 30 tablet 1   TRULANCE 3 MG TABS TAKE 1 TABLET BY MOUTH DAILY 60 tablet 3   venlafaxine XR (EFFEXOR-XR) 150 MG 24 hr capsule Take 1 capsule (150 mg total) by mouth daily with breakfast. Take 1 tablet daily 90 capsule 3   No current facility-administered medications for this visit.    PHYSICAL EXAMINATION: ECOG PERFORMANCE STATUS: 1 - Symptomatic but completely ambulatory  Vitals:   07/11/21 1124  BP: 106/67  Pulse: (!) 114  Resp: 19  Temp: 98.8 F (37.1 C)  SpO2: 99%   Filed Weights   07/11/21 1124  Weight: 191 lb 1.6 oz (86.7 kg)    LABORATORY DATA:  I have reviewed the data as listed CMP Latest Ref Rng & Units 06/27/2021 06/13/2021 05/29/2021  Glucose 70 - 99 mg/dL 119(H) 98 107(H)  BUN 6 - 20 mg/dL '12 16 15  ' Creatinine 0.44 - 1.00 mg/dL 0.78 0.99 0.81  Sodium 135 - 145 mmol/L 137 136  136  Potassium 3.5 - 5.1 mmol/L 4.0 4.3 3.9  Chloride 98 - 111 mmol/L 105 103 104  CO2 22 - 32 mmol/L '25 29 27  ' Calcium 8.9 - 10.3 mg/dL 9.2 9.1 9.1  Total Protein 6.5 - 8.1 g/dL 6.8 6.8 7.1  Total Bilirubin 0.3 - 1.2 mg/dL 0.3 0.3 0.5  Alkaline Phos 38 - 126 U/L 124 108 91  AST 15 - 41 U/L 16 20 14(L)  ALT 0 - 44 U/L '18 27 12    ' Lab Results  Component Value Date   WBC 10.4 07/11/2021   HGB 9.0 (L) 07/11/2021   HCT 29.0 (L) 07/11/2021   MCV 86.3 07/11/2021   PLT 374 07/11/2021   NEUTROABS 7.4 07/11/2021    ASSESSMENT & PLAN:  Malignant neoplasm of upper-outer quadrant of left breast in female, estrogen receptor positive ([place]) left breast upper outer quadrant biopsy 02/07/2021 for a clinical  T2 Ni, stage IIIA invasive ductal carcinoma, grade 3, 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  Neoadjuvant chemotherapy with weekly Taxol x12 followed by dose dense Adriamycin and Cytoxan  2. followed by breast conserving surgery 3.  Followed by radiation 4.  Followed by antiestrogen therapy -------------------------------------------------------------------------------------------------------------------------------- Current treatment: Patient completed 12 cycles of weekly Taxol today is cycle 4 Adriamycin and Cytoxan Chemo toxicities: Chemo induced anemia: Monitoring today's hemoglobin 9 Bone pain: She takes oxycodone as needed Severe acid reflux: On Pepcid and Protonix   Shortness of breath: We will get an echocardiogram in the next week for further evaluation. Port can be removed during her surgery  Return to clinic after surgery    No orders of the defined types were placed in this encounter.  The patient has a good understanding of the overall plan. she agrees with it. she will call with any problems that may develop before the next visit here.  Total time spent: 30 mins including face to face time and time spent for planning,  charting and coordination of care  Rulon Eisenmenger, MD, MPH 07/11/2021  I, Thana Ates, am acting as scribe for Dr. Nicholas Lose.  I have reviewed the above documentation for accuracy and completeness, and I agree with the above.

## 2021-07-10 NOTE — Assessment & Plan Note (Signed)
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 Chemo induced anemia: Monitoring today's hemoglobin 9 2. Bone pain: She takes oxycodone as needed 3. Severe acid reflux: On Pepcid and Protonix  We discussed about return to work. I informed her that she could return to work after surgery and recovery. I ordered the breast MRI to be done after chemo is complete. I sent a message to our navigators as well as Dr. Ninfa Linden to see her after MRI. Return to clinic after surgery

## 2021-07-11 ENCOUNTER — Inpatient Hospital Stay: Payer: BC Managed Care – PPO

## 2021-07-11 ENCOUNTER — Inpatient Hospital Stay (HOSPITAL_BASED_OUTPATIENT_CLINIC_OR_DEPARTMENT_OTHER): Payer: BC Managed Care – PPO | Admitting: Hematology and Oncology

## 2021-07-11 ENCOUNTER — Other Ambulatory Visit: Payer: Self-pay

## 2021-07-11 ENCOUNTER — Encounter: Payer: Self-pay | Admitting: *Deleted

## 2021-07-11 ENCOUNTER — Other Ambulatory Visit: Payer: Self-pay | Admitting: Surgery

## 2021-07-11 DIAGNOSIS — C50412 Malignant neoplasm of upper-outer quadrant of left female breast: Secondary | ICD-10-CM

## 2021-07-11 DIAGNOSIS — Z170 Estrogen receptor positive status [ER+]: Secondary | ICD-10-CM | POA: Diagnosis not present

## 2021-07-11 DIAGNOSIS — Z95828 Presence of other vascular implants and grafts: Secondary | ICD-10-CM

## 2021-07-11 DIAGNOSIS — Z5111 Encounter for antineoplastic chemotherapy: Secondary | ICD-10-CM | POA: Diagnosis not present

## 2021-07-11 LAB — CBC WITH DIFFERENTIAL (CANCER CENTER ONLY)
Abs Immature Granulocytes: 0.47 10*3/uL — ABNORMAL HIGH (ref 0.00–0.07)
Basophils Absolute: 0.1 10*3/uL (ref 0.0–0.1)
Basophils Relative: 1 %
Eosinophils Absolute: 0 10*3/uL (ref 0.0–0.5)
Eosinophils Relative: 0 %
HCT: 29 % — ABNORMAL LOW (ref 36.0–46.0)
Hemoglobin: 9 g/dL — ABNORMAL LOW (ref 12.0–15.0)
Immature Granulocytes: 5 %
Lymphocytes Relative: 10 %
Lymphs Abs: 1.1 10*3/uL (ref 0.7–4.0)
MCH: 26.8 pg (ref 26.0–34.0)
MCHC: 31 g/dL (ref 30.0–36.0)
MCV: 86.3 fL (ref 80.0–100.0)
Monocytes Absolute: 1.4 10*3/uL — ABNORMAL HIGH (ref 0.1–1.0)
Monocytes Relative: 14 %
Neutro Abs: 7.4 10*3/uL (ref 1.7–7.7)
Neutrophils Relative %: 70 %
Platelet Count: 374 10*3/uL (ref 150–400)
RBC: 3.36 MIL/uL — ABNORMAL LOW (ref 3.87–5.11)
RDW: 25.3 % — ABNORMAL HIGH (ref 11.5–15.5)
WBC Count: 10.4 10*3/uL (ref 4.0–10.5)
nRBC: 1 % — ABNORMAL HIGH (ref 0.0–0.2)

## 2021-07-11 LAB — CMP (CANCER CENTER ONLY)
ALT: 23 U/L (ref 0–44)
AST: 23 U/L (ref 15–41)
Albumin: 3.8 g/dL (ref 3.5–5.0)
Alkaline Phosphatase: 105 U/L (ref 38–126)
Anion gap: 6 (ref 5–15)
BUN: 11 mg/dL (ref 6–20)
CO2: 28 mmol/L (ref 22–32)
Calcium: 9.2 mg/dL (ref 8.9–10.3)
Chloride: 104 mmol/L (ref 98–111)
Creatinine: 0.88 mg/dL (ref 0.44–1.00)
GFR, Estimated: >60 mL/min (ref >60)
Glucose, Bld: 100 mg/dL — ABNORMAL HIGH (ref 70–99)
Potassium: 3.9 mmol/L (ref 3.5–5.1)
Sodium: 138 mmol/L (ref 135–145)
Total Bilirubin: 0.3 mg/dL (ref 0.3–1.2)
Total Protein: 6.9 g/dL (ref 6.5–8.1)

## 2021-07-11 MED ORDER — DOXORUBICIN HCL CHEMO IV INJECTION 2 MG/ML
60.0000 mg/m2 | Freq: Once | INTRAVENOUS | Status: AC
  Administered 2021-07-11: 126 mg via INTRAVENOUS
  Filled 2021-07-11: qty 63

## 2021-07-11 MED ORDER — ALTEPLASE 2 MG IJ SOLR
2.0000 mg | Freq: Once | INTRAMUSCULAR | Status: AC
  Administered 2021-07-11: 2 mg
  Filled 2021-07-11: qty 2

## 2021-07-11 MED ORDER — SODIUM CHLORIDE 0.9 % IV SOLN: Freq: Once | INTRAVENOUS | Status: AC

## 2021-07-11 MED ORDER — SODIUM CHLORIDE 0.9% FLUSH
10.0000 mL | INTRAVENOUS | Status: DC | PRN
  Administered 2021-07-11: 10 mL

## 2021-07-11 MED ORDER — SODIUM CHLORIDE 0.9 % IV SOLN
150.0000 mg | Freq: Once | INTRAVENOUS | Status: AC
  Administered 2021-07-11: 150 mg via INTRAVENOUS
  Filled 2021-07-11: qty 150

## 2021-07-11 MED ORDER — PALONOSETRON HCL INJECTION 0.25 MG/5ML
0.2500 mg | Freq: Once | INTRAVENOUS | Status: AC
  Administered 2021-07-11: 0.25 mg via INTRAVENOUS
  Filled 2021-07-11: qty 5

## 2021-07-11 MED ORDER — HEPARIN SOD (PORK) LOCK FLUSH 100 UNIT/ML IV SOLN
500.0000 [IU] | Freq: Once | INTRAVENOUS | Status: AC | PRN
  Administered 2021-07-11: 500 [IU]

## 2021-07-11 MED ORDER — SODIUM CHLORIDE 0.9 % IV SOLN
10.0000 mg | Freq: Once | INTRAVENOUS | Status: AC
  Administered 2021-07-11: 10 mg via INTRAVENOUS
  Filled 2021-07-11: qty 10

## 2021-07-11 MED ORDER — SODIUM CHLORIDE 0.9% FLUSH
10.0000 mL | Freq: Once | INTRAVENOUS | Status: AC
  Administered 2021-07-11: 10 mL

## 2021-07-11 MED ORDER — SODIUM CHLORIDE 0.9 % IV SOLN
600.0000 mg/m2 | Freq: Once | INTRAVENOUS | Status: AC
  Administered 2021-07-11: 1260 mg via INTRAVENOUS
  Filled 2021-07-11: qty 63

## 2021-07-11 NOTE — Patient Instructions (Signed)
[facility]COL[facility]  Discharge Instructions: Thank you for choosing [facility] to provide your oncology and hematology care.   If you have a lab appointment with the [facility], please go directly to the [facility] and check in at the registration area.   Wear comfortable clothing and clothing appropriate for easy access to any Portacath or PICC line.   We strive to give you quality time with your provider. You may need to reschedule your appointment if you arrive late (15 or more minutes).  Arriving late affects you and other patients whose appointments are after yours.  Also, if you miss three or more appointments without notifying the office, you may be dismissed from the clinic at the providers discretion.      For prescription refill requests, have your pharmacy contact our office and allow 72 hours for refills to be completed.    Today you received the following chemotherapy and/or immunotherapy agents: Cytoxan, Doxorubicin.       To help prevent nausea and vomiting after your treatment, we encourage you to take your nausea medication as directed.  BELOW ARE SYMPTOMS THAT SHOULD BE REPORTED IMMEDIATELY: *FEVER GREATER THAN 100.4 F (38 C) OR HIGHER *CHILLS OR SWEATING *NAUSEA AND VOMITING THAT IS NOT CONTROLLED WITH YOUR NAUSEA MEDICATION *UNUSUAL SHORTNESS OF BREATH *UNUSUAL BRUISING OR BLEEDING *URINARY PROBLEMS (pain or burning when urinating, or frequent urination) *BOWEL PROBLEMS (unusual diarrhea, constipation, pain near the anus) TENDERNESS IN MOUTH AND THROAT WITH OR WITHOUT PRESENCE OF ULCERS (sore throat, sores in mouth, or a toothache) UNUSUAL RASH, SWELLING OR PAIN  UNUSUAL VAGINAL DISCHARGE OR ITCHING   Items with * indicate a potential emergency and should be followed up as soon as possible or go to the Emergency Department if any problems should occur.  Please show the CHEMOTHERAPY ALERT CARD or IMMUNOTHERAPY ALERT CARD at  check-in to the Emergency Department and triage nurse.  Should you have questions after your visit or need to cancel or reschedule your appointment, please contact [facility]  Dept: [phone]  and follow the prompts.  Office hours are 8:00 a.m. to 4:30 p.m. Monday - Friday. Please note that voicemails left after 4:00 p.m. may not be returned until the following business day.  We are closed weekends and major holidays. You have access to a nurse at all times for urgent questions. Please call the main number to the clinic Dept: [phone] and follow the prompts.   For any non-urgent questions, you may also contact your provider using MyChart. We now offer e-Visits for anyone 70 and older to request care online for non-urgent symptoms. For details visit [email or web address].   Also download the MyChart app! Go to the app store, search "MyChart", open the app, select [facility], and log in with your MyChart username and password.  Due to Covid, a mask is required upon entering the hospital/clinic. If you do not have a mask, one will be given to you upon arrival. For doctor visits, patients may have 1 support person aged 28 or older with them. For treatment visits, patients cannot have anyone with them due to current Covid guidelines and our immunocompromised population.

## 2021-07-11 NOTE — Progress Notes (Signed)
Per MD okay to treat today with HR 114.

## 2021-07-12 ENCOUNTER — Ambulatory Visit
Admission: RE | Admit: 2021-07-12 | Discharge: 2021-07-12 | Disposition: A | Discharge status: Home / Self Care | Payer: BC Managed Care – PPO | Source: Ambulatory Visit | Attending: Hematology and Oncology | Admitting: Hematology and Oncology

## 2021-07-12 ENCOUNTER — Encounter: Payer: Self-pay | Admitting: Hematology and Oncology

## 2021-07-12 DIAGNOSIS — C50412 Malignant neoplasm of upper-outer quadrant of left female breast: Secondary | ICD-10-CM

## 2021-07-12 MED ORDER — GADOBUTROL 1 MMOL/ML IV SOLN
8.0000 mL | Freq: Once | INTRAVENOUS | Status: AC | PRN
  Administered 2021-07-12: 8 mL via INTRAVENOUS

## 2021-07-13 ENCOUNTER — Inpatient Hospital Stay: Payer: BC Managed Care – PPO

## 2021-07-13 ENCOUNTER — Other Ambulatory Visit: Payer: Self-pay

## 2021-07-13 VITALS — BP 118/80 | HR 109 | Temp 97.8°F

## 2021-07-13 DIAGNOSIS — Z5111 Encounter for antineoplastic chemotherapy: Secondary | ICD-10-CM | POA: Diagnosis not present

## 2021-07-13 MED ORDER — PEGFILGRASTIM-CBQV 6 MG/0.6ML ~~LOC~~ SOSY
6.0000 mg | PREFILLED_SYRINGE | Freq: Once | SUBCUTANEOUS | Status: AC
  Administered 2021-07-13: 6 mg via SUBCUTANEOUS

## 2021-07-13 NOTE — Patient Instructions (Signed)

## 2021-07-15 ENCOUNTER — Encounter: Payer: Self-pay | Admitting: *Deleted

## 2021-07-19 ENCOUNTER — Other Ambulatory Visit: Payer: Self-pay | Admitting: Surgery

## 2021-07-19 DIAGNOSIS — Z853 Personal history of malignant neoplasm of breast: Secondary | ICD-10-CM

## 2021-07-24 ENCOUNTER — Encounter: Payer: Self-pay | Admitting: Hematology and Oncology

## 2021-07-24 ENCOUNTER — Other Ambulatory Visit: Payer: Self-pay | Admitting: Hematology and Oncology

## 2021-07-24 DIAGNOSIS — C50412 Malignant neoplasm of upper-outer quadrant of left female breast: Secondary | ICD-10-CM

## 2021-07-24 MED ORDER — OXYCODONE HCL 5 MG PO TABS: 2.5000 mg | ORAL_TABLET | ORAL | 0 refills | Status: DC | PRN

## 2021-07-24 MED ORDER — LORAZEPAM 0.5 MG PO TABS: 0.5000 mg | ORAL_TABLET | Freq: Four times a day (QID) | ORAL | 0 refills | Status: DC | PRN

## 2021-07-25 ENCOUNTER — Encounter: Payer: Self-pay | Admitting: *Deleted

## 2021-07-29 ENCOUNTER — Telehealth: Payer: Self-pay | Admitting: *Deleted

## 2021-07-29 NOTE — Telephone Encounter (Signed)
Left message for a return phone call regarding a message I received from [facility] that her insurance company doesn't cover surgery. She did have her port placed by Dr. Ninfa Linden back in October 2022.  ?

## 2021-08-01 ENCOUNTER — Other Ambulatory Visit: Payer: Self-pay | Admitting: Surgery

## 2021-08-01 ENCOUNTER — Encounter: Payer: Self-pay | Admitting: *Deleted

## 2021-08-02 ENCOUNTER — Other Ambulatory Visit: Payer: Self-pay

## 2021-08-02 ENCOUNTER — Ambulatory Visit (HOSPITAL_COMMUNITY)
Admission: RE | Admit: 2021-08-02 | Discharge: 2021-08-02 | Disposition: A | Discharge status: Home / Self Care | Payer: BC Managed Care – PPO | Source: Ambulatory Visit | Attending: Hematology and Oncology | Admitting: Hematology and Oncology

## 2021-08-02 DIAGNOSIS — Z79810 Long term (current) use of selective estrogen receptor modulators (SERMs): Secondary | ICD-10-CM | POA: Insufficient documentation

## 2021-08-02 DIAGNOSIS — Z170 Estrogen receptor positive status [ER+]: Secondary | ICD-10-CM | POA: Diagnosis not present

## 2021-08-02 DIAGNOSIS — C50412 Malignant neoplasm of upper-outer quadrant of left female breast: Secondary | ICD-10-CM | POA: Insufficient documentation

## 2021-08-02 DIAGNOSIS — Z5181 Encounter for therapeutic drug level monitoring: Secondary | ICD-10-CM | POA: Diagnosis present

## 2021-08-02 DIAGNOSIS — R0602 Shortness of breath: Secondary | ICD-10-CM | POA: Insufficient documentation

## 2021-08-02 LAB — ECHOCARDIOGRAM COMPLETE
AR max vel: 3.56 cm2
AV Area VTI: 3.19 cm2
AV Area mean vel: 3.24 cm2
AV Mean grad: 3 mmHg
AV Peak grad: 4.8 mmHg
Ao pk vel: 1.1 m/s
Area-P 1/2: 5.23 cm2
S' Lateral: 3 cm

## 2021-08-02 NOTE — Progress Notes (Signed)
Echocardiogram ?2D Echocardiogram has been performed. ? ?Arlyss Gandy ?08/02/2021, 11:10 AM ?

## 2021-08-08 ENCOUNTER — Telehealth: Payer: Self-pay | Admitting: Hematology and Oncology

## 2021-08-08 NOTE — Telephone Encounter (Signed)
.  Called patient to schedule appointment per 3/16 inbasket, patient is aware of date and time.   ?

## 2021-08-12 ENCOUNTER — Encounter (HOSPITAL_BASED_OUTPATIENT_CLINIC_OR_DEPARTMENT_OTHER): Payer: Self-pay | Admitting: Surgery

## 2021-08-14 ENCOUNTER — Other Ambulatory Visit: Payer: Self-pay

## 2021-08-14 ENCOUNTER — Encounter (HOSPITAL_BASED_OUTPATIENT_CLINIC_OR_DEPARTMENT_OTHER): Payer: Self-pay | Admitting: Surgery

## 2021-08-20 ENCOUNTER — Ambulatory Visit
Admission: RE | Admit: 2021-08-20 | Discharge: 2021-08-20 | Disposition: A | Discharge status: Home / Self Care | Payer: BC Managed Care – PPO | Source: Ambulatory Visit | Attending: Surgery | Admitting: Surgery

## 2021-08-20 ENCOUNTER — Other Ambulatory Visit: Payer: Self-pay | Admitting: Adult Health

## 2021-08-20 DIAGNOSIS — C50412 Malignant neoplasm of upper-outer quadrant of left female breast: Secondary | ICD-10-CM

## 2021-08-20 DIAGNOSIS — Z853 Personal history of malignant neoplasm of breast: Secondary | ICD-10-CM

## 2021-08-20 MED ORDER — ENSURE PRE-SURGERY PO LIQD: 296.0000 mL | Freq: Once | ORAL | Status: DC

## 2021-08-20 NOTE — Progress Notes (Signed)

## 2021-08-20 NOTE — H&P (Signed)
? ?PROVIDER: Beverlee Nims, MD ? ?MRN: [id number] ?DOB: January 07, 1976 ? ?Subjective  ? ?Chief Complaint: Breast Cancer (Discuss left breast cancer) ? ? ?History of Present Illness: ?Catherine Ellison is a 46 y.o. female who is seen today for for long-term follow-up regarding her left breast cancer. She has completed neoadjuvant therapy. Her follow-up MRI showed a decrease in the size of the left breast cancer from greater than 4 cm to just over 2 cm. It is also shown significant improvement in her left axillary lymphadenopathy. She reports she has been doing well. Her Port-A-Cath is worked well. We have discussed her in a multidisciplinary conference and a plan to leave the Port-A-Cath in place until the final pathology is known from the final surgery. She denies nipple discharge. She has had no recent fevers or chills. She has had some occasional chest discomfort. ? ? ? ?Review of Systems: ?A complete review of systems was obtained from the patient. I have reviewed this information and discussed as appropriate with the patient. See HPI as well for other ROS. ? ?ROS  ? ?Medical History: ?Past Medical History:  ?Diagnosis Date  ? Anemia  ? Diabetes mellitus without complication (CMS-HCC)  ? ?Patient Active Problem List  ?Diagnosis  ? Malignant neoplasm of upper-outer quadrant of left breast in female, estrogen receptor positive (CMS-HCC)  ? Breast lump  ? ?Past Surgical History:  ?Procedure Laterality Date  ? OOPHORECTOMY Left  ?2006  ? SALPINGECTOMY Bilateral  ?2014  ? ? ?No Known Allergies ? ?Current Outpatient Medications on File Prior to Visit  ?Medication Sig Dispense Refill  ? famotidine (PEPCID) 20 MG tablet Take 20 mg by mouth 2 (two) times daily  ? ferrous sulfate (FER-IN-SOL) 15 mg iron (75 mg)/mL oral drops 5 mls  ? lidocaine-prilocaine (EMLA) cream Apply to affected area once  ? linaCLOtide (LINZESS) 72 mcg capsule Take by mouth  ? ondansetron (ZOFRAN) 8 MG tablet Take by mouth  ? plecanatide  (TRULANCE) 3 mg tablet Take 1 tablet by mouth once daily  ? prochlorperazine (COMPAZINE) 10 MG tablet Take by mouth  ? dexAMETHasone (DECADRON) 4 MG tablet TAKE 2 TABLETS BY MOUTH DAILY **TAKE 3 DAYS AFTER CHEMO AND TAKE WITH FOOD  ? dicyclomine (BENTYL) 10 mg capsule Take 10 mg by mouth (Patient not taking: Reported on 02/13/2021)  ? DULoxetine (CYMBALTA) 20 MG DR capsule Take 40 mg by mouth once daily  ? gabapentin (NEURONTIN) 300 MG capsule Take 300 mg by mouth 3 (three) times daily  ? ibuprofen (MOTRIN) 800 MG tablet Take 800 mg by mouth every 8 (eight) hours as needed  ? LORazepam (ATIVAN) 0.5 MG tablet TAKE 1 TABLET BY MOUTH EVERY 6 HOURS AS NEEDED NAUSEA AND VOMITING  ? lubiprostone (AMITIZA) 24 MCG capsule Take by mouth  ? pantoprazole (PROTONIX) 40 MG DR tablet  ? ?No current facility-administered medications on file prior to visit.  ? ?Family History  ?Problem Relation Age of Onset  ? High blood pressure (Hypertension) Mother  ? Diabetes Mother  ? High blood pressure (Hypertension) Father  ? Hyperlipidemia (Elevated cholesterol) Father  ? Diabetes Father  ? Heart disease Father  ? Diabetes Sister  ? High blood pressure (Hypertension) Sister  ? High blood pressure (Hypertension) Maternal Grandmother  ? Cancer Maternal Grandmother  ? High blood pressure (Hypertension) Maternal Grandfather  ? High blood pressure (Hypertension) Paternal Grandmother  ? High blood pressure (Hypertension) Paternal Grandfather  ? Cancer Paternal Grandfather  ? ? ?Social History  ? ?Tobacco  Use  ?Smoking Status Some Days  ?Smokeless Tobacco Never  ? ? ?Social History  ? ?Socioeconomic History  ? Marital status: Unknown  ?Tobacco Use  ? Smoking status: Some Days  ? Smokeless tobacco: Never  ?Substance and Sexual Activity  ? Alcohol use: Yes  ?Comment: socially  ? Drug use: Never  ? ?Objective:  ? ?There were no vitals filed for this visit.  ?There is no height or weight on file to calculate BMI. ? ?Physical Exam  ? ?She appears well  today. ? ?On breast exam, I can no longer palpate the breast mass in the upper outer quadrant of the breast. There is no skin changes. ? ?There is no left axillary lymphadenopathy ? ?Labs, Imaging and Diagnostic Testing: ? ?Reviewed her MRI as well as the previous visit from her medical oncologist ? ?Assessment and Plan:  ? ?Diagnoses and all orders for this visit: ? ?Malignant neoplasm of [place]ola of left breast in female, unspecified estrogen receptor status (CMS-HCC) ? ? ? ?She has now had an excellent response to neoadjuvant therapy. Given the significant improvement, we can now do a lumpectomy and breast conservation. As I can no longer feel the mass, she will need a radioactive seed guided left breast lumpectomy. We will still plan on doing a complete axillary dissection given the previous adenopathy which was quite significant on the initial films. ?I discussed the surgical procedure with her in detail. We discussed the risk of surgery which includes but is not limited to bleeding, infection, the need for further surgery if the margins are positive, injury to surrounding structures, the need for drain in the axilla postoperatively, chronic arm swelling, seroma formation, cardiopulmonary issues, etc. She understands and wished to proceed with surgery which will be scheduled  ?

## 2021-08-21 ENCOUNTER — Ambulatory Visit (HOSPITAL_BASED_OUTPATIENT_CLINIC_OR_DEPARTMENT_OTHER): Payer: BC Managed Care – PPO | Admitting: Certified Registered"

## 2021-08-21 ENCOUNTER — Encounter (HOSPITAL_BASED_OUTPATIENT_CLINIC_OR_DEPARTMENT_OTHER): Payer: Self-pay | Admitting: Surgery

## 2021-08-21 ENCOUNTER — Other Ambulatory Visit: Payer: Self-pay

## 2021-08-21 ENCOUNTER — Observation Stay (HOSPITAL_BASED_OUTPATIENT_CLINIC_OR_DEPARTMENT_OTHER)
Admission: RE | Admit: 2021-08-21 | Discharge: 2021-08-22 | Disposition: A | Discharge status: Home / Self Care | Payer: BC Managed Care – PPO | Attending: Surgery | Admitting: Surgery

## 2021-08-21 ENCOUNTER — Encounter (HOSPITAL_BASED_OUTPATIENT_CLINIC_OR_DEPARTMENT_OTHER)
Admission: RE | Disposition: A | Discharge status: Home / Self Care | Payer: Self-pay | Source: Home / Self Care | Attending: Surgery

## 2021-08-21 ENCOUNTER — Ambulatory Visit
Admission: RE | Admit: 2021-08-21 | Discharge: 2021-08-21 | Disposition: A | Discharge status: Home / Self Care | Payer: BC Managed Care – PPO | Source: Ambulatory Visit | Attending: Surgery | Admitting: Surgery

## 2021-08-21 DIAGNOSIS — Z170 Estrogen receptor positive status [ER+]: Secondary | ICD-10-CM | POA: Diagnosis not present

## 2021-08-21 DIAGNOSIS — C50012 Malignant neoplasm of nipple and areola, left female breast: Secondary | ICD-10-CM | POA: Diagnosis not present

## 2021-08-21 DIAGNOSIS — C50912 Malignant neoplasm of unspecified site of left female breast: Secondary | ICD-10-CM | POA: Diagnosis present

## 2021-08-21 DIAGNOSIS — Z853 Personal history of malignant neoplasm of breast: Secondary | ICD-10-CM

## 2021-08-21 DIAGNOSIS — E119 Type 2 diabetes mellitus without complications: Secondary | ICD-10-CM | POA: Insufficient documentation

## 2021-08-21 DIAGNOSIS — N6032 Fibrosclerosis of left breast: Secondary | ICD-10-CM | POA: Diagnosis not present

## 2021-08-21 DIAGNOSIS — F17200 Nicotine dependence, unspecified, uncomplicated: Secondary | ICD-10-CM | POA: Insufficient documentation

## 2021-08-21 DIAGNOSIS — Z9889 Other specified postprocedural states: Secondary | ICD-10-CM

## 2021-08-21 DIAGNOSIS — C50412 Malignant neoplasm of upper-outer quadrant of left female breast: Principal | ICD-10-CM | POA: Insufficient documentation

## 2021-08-21 HISTORY — PX: RADIOACTIVE SEED GUIDED PARTIAL MASTECTOMY/AXILLARY SENTINEL NODE BIOPSY/AXILLARY NODE DISSECTION: SHX6491

## 2021-08-21 HISTORY — PX: BREAST LUMPECTOMY: SHX2

## 2021-08-21 LAB — POCT PREGNANCY, URINE
Preg Test, Ur: NEGATIVE
Preg Test, Ur: NEGATIVE

## 2021-08-21 SURGERY — RADIOACTIVE SEED GUIDED PARTIAL MASTECTOMY WITH AXILLARY SENTINEL LYMPH NODE BIOPSY AND AXILLARY LYMPH NODE DISSECTION
Anesthesia: General | Laterality: Left

## 2021-08-21 MED ORDER — ONDANSETRON HCL 4 MG/2ML IJ SOLN: 4.0000 mg | Freq: Four times a day (QID) | INTRAMUSCULAR | Status: DC | PRN

## 2021-08-21 MED ORDER — 0.9 % SODIUM CHLORIDE (POUR BTL) OPTIME
TOPICAL | Status: DC | PRN
  Administered 2021-08-21: 600 mL

## 2021-08-21 MED ORDER — HYDROMORPHONE HCL 1 MG/ML IJ SOLN
INTRAMUSCULAR | Status: AC
  Filled 2021-08-21: qty 0.5

## 2021-08-21 MED ORDER — MIDAZOLAM HCL 2 MG/2ML IJ SOLN
INTRAMUSCULAR | Status: AC
  Filled 2021-08-21: qty 2

## 2021-08-21 MED ORDER — GABAPENTIN 100 MG PO CAPS
ORAL_CAPSULE | ORAL | Status: AC
  Filled 2021-08-21: qty 1

## 2021-08-21 MED ORDER — OXYCODONE HCL 5 MG/5ML PO SOLN: 5.0000 mg | Freq: Once | ORAL | Status: DC | PRN

## 2021-08-21 MED ORDER — HYDROMORPHONE HCL 1 MG/ML IJ SOLN: 1.0000 mg | INTRAMUSCULAR | Status: DC | PRN

## 2021-08-21 MED ORDER — LIDOCAINE HCL (CARDIAC) PF 100 MG/5ML IV SOSY
PREFILLED_SYRINGE | INTRAVENOUS | Status: DC | PRN
  Administered 2021-08-21: 40 mg via INTRAVENOUS

## 2021-08-21 MED ORDER — DROPERIDOL 2.5 MG/ML IJ SOLN: 0.6250 mg | Freq: Once | INTRAMUSCULAR | Status: DC | PRN

## 2021-08-21 MED ORDER — SCOPOLAMINE 1 MG/3DAYS TD PT72
MEDICATED_PATCH | TRANSDERMAL | Status: AC
  Filled 2021-08-21: qty 1

## 2021-08-21 MED ORDER — ACETAMINOPHEN 500 MG PO TABS
1000.0000 mg | ORAL_TABLET | Freq: Four times a day (QID) | ORAL | Status: DC
  Administered 2021-08-21 – 2021-08-22 (×2): 1000 mg via ORAL
  Filled 2021-08-21 (×2): qty 2

## 2021-08-21 MED ORDER — FENTANYL CITRATE (PF) 100 MCG/2ML IJ SOLN
INTRAMUSCULAR | Status: AC
  Filled 2021-08-21: qty 2

## 2021-08-21 MED ORDER — ONDANSETRON 4 MG PO TBDP: 4.0000 mg | ORAL_TABLET | Freq: Four times a day (QID) | ORAL | Status: DC | PRN

## 2021-08-21 MED ORDER — HYDROMORPHONE HCL 1 MG/ML IJ SOLN
0.2500 mg | INTRAMUSCULAR | Status: DC | PRN
  Administered 2021-08-21 (×4): 0.5 mg via INTRAVENOUS

## 2021-08-21 MED ORDER — FAMOTIDINE 20 MG PO TABS
20.0000 mg | ORAL_TABLET | Freq: Two times a day (BID) | ORAL | Status: DC
  Administered 2021-08-21: 20 mg via ORAL
  Filled 2021-08-21: qty 1

## 2021-08-21 MED ORDER — LACTATED RINGERS IV SOLN: INTRAVENOUS | Status: DC

## 2021-08-21 MED ORDER — BUPIVACAINE HCL (PF) 0.5 % IJ SOLN
INTRAMUSCULAR | Status: DC | PRN
  Administered 2021-08-21: 20 mL via PERINEURAL

## 2021-08-21 MED ORDER — MIDAZOLAM HCL 2 MG/2ML IJ SOLN
2.0000 mg | Freq: Once | INTRAMUSCULAR | Status: AC
Start: 2021-08-21 — End: 2021-08-21
  Administered 2021-08-21: 2 mg via INTRAVENOUS

## 2021-08-21 MED ORDER — GABAPENTIN 100 MG PO CAPS
400.0000 mg | ORAL_CAPSULE | Freq: Three times a day (TID) | ORAL | Status: DC
Start: 2021-08-21 — End: 2021-08-22
  Administered 2021-08-21: 400 mg via ORAL

## 2021-08-21 MED ORDER — SCOPOLAMINE 1 MG/3DAYS TD PT72
1.0000 | MEDICATED_PATCH | TRANSDERMAL | Status: DC
  Administered 2021-08-21: 1.5 mg via TRANSDERMAL

## 2021-08-21 MED ORDER — FENTANYL CITRATE (PF) 100 MCG/2ML IJ SOLN
100.0000 ug | Freq: Once | INTRAMUSCULAR | Status: AC
  Administered 2021-08-21: 100 ug via INTRAVENOUS

## 2021-08-21 MED ORDER — OXYCODONE HCL 5 MG PO TABS
5.0000 mg | ORAL_TABLET | ORAL | Status: DC | PRN
  Administered 2021-08-21 – 2021-08-22 (×2): 5 mg via ORAL
  Filled 2021-08-21 (×2): qty 1

## 2021-08-21 MED ORDER — TRAMADOL HCL 50 MG PO TABS: 50.0000 mg | ORAL_TABLET | Freq: Four times a day (QID) | ORAL | Status: DC | PRN

## 2021-08-21 MED ORDER — FENTANYL CITRATE (PF) 100 MCG/2ML IJ SOLN
INTRAMUSCULAR | Status: DC | PRN
Start: 2021-08-21 — End: 2021-08-21
  Administered 2021-08-21 (×2): 50 ug via INTRAVENOUS

## 2021-08-21 MED ORDER — BUPIVACAINE LIPOSOME 1.3 % IJ SUSP: INTRAMUSCULAR | Status: DC | PRN

## 2021-08-21 MED ORDER — BUPIVACAINE LIPOSOME 1.3 % IJ SUSP
INTRAMUSCULAR | Status: DC | PRN
  Administered 2021-08-21: 10 mL via PERINEURAL

## 2021-08-21 MED ORDER — ACETAMINOPHEN 500 MG PO TABS
1000.0000 mg | ORAL_TABLET | ORAL | Status: AC
  Administered 2021-08-21: 1000 mg via ORAL

## 2021-08-21 MED ORDER — PROPOFOL 10 MG/ML IV BOLUS
INTRAVENOUS | Status: DC | PRN
  Administered 2021-08-21: 150 mg via INTRAVENOUS

## 2021-08-21 MED ORDER — DIPHENHYDRAMINE HCL 25 MG PO CAPS
25.0000 mg | ORAL_CAPSULE | Freq: Four times a day (QID) | ORAL | Status: DC | PRN
  Administered 2021-08-21 – 2021-08-22 (×2): 25 mg via ORAL
  Filled 2021-08-21 (×2): qty 1

## 2021-08-21 MED ORDER — ENOXAPARIN SODIUM 40 MG/0.4ML IJ SOSY
40.0000 mg | PREFILLED_SYRINGE | INTRAMUSCULAR | Status: DC
  Administered 2021-08-22: 40 mg via SUBCUTANEOUS
  Filled 2021-08-21: qty 0.4

## 2021-08-21 MED ORDER — ATROPINE SULFATE 0.4 MG/ML IV SOLN
INTRAVENOUS | Status: AC
  Filled 2021-08-21: qty 1

## 2021-08-21 MED ORDER — CHLORHEXIDINE GLUCONATE CLOTH 2 % EX PADS
6.0000 | MEDICATED_PAD | Freq: Once | CUTANEOUS | Status: AC
  Administered 2021-08-21: 6 via TOPICAL

## 2021-08-21 MED ORDER — PHENYLEPHRINE 40 MCG/ML (10ML) SYRINGE FOR IV PUSH (FOR BLOOD PRESSURE SUPPORT)
PREFILLED_SYRINGE | INTRAVENOUS | Status: AC
  Filled 2021-08-21: qty 10

## 2021-08-21 MED ORDER — SODIUM CHLORIDE 0.9 % IV SOLN: INTRAVENOUS | Status: DC

## 2021-08-21 MED ORDER — ONDANSETRON HCL 4 MG/2ML IJ SOLN
INTRAMUSCULAR | Status: AC
  Filled 2021-08-21: qty 2

## 2021-08-21 MED ORDER — ONDANSETRON HCL 4 MG/2ML IJ SOLN
INTRAMUSCULAR | Status: DC | PRN
  Administered 2021-08-21: 4 mg via INTRAVENOUS

## 2021-08-21 MED ORDER — GABAPENTIN 300 MG PO CAPS
ORAL_CAPSULE | ORAL | Status: AC
  Filled 2021-08-21: qty 1

## 2021-08-21 MED ORDER — METHOCARBAMOL 500 MG PO TABS
500.0000 mg | ORAL_TABLET | Freq: Four times a day (QID) | ORAL | Status: DC | PRN
  Administered 2021-08-21: 500 mg via ORAL
  Filled 2021-08-21: qty 1

## 2021-08-21 MED ORDER — CEFAZOLIN SODIUM-DEXTROSE 2-4 GM/100ML-% IV SOLN
2.0000 g | INTRAVENOUS | Status: AC
  Administered 2021-08-21: 2 g via INTRAVENOUS

## 2021-08-21 MED ORDER — CEFAZOLIN SODIUM-DEXTROSE 2-4 GM/100ML-% IV SOLN
INTRAVENOUS | Status: AC
  Filled 2021-08-21: qty 100

## 2021-08-21 MED ORDER — OXYCODONE HCL 5 MG PO TABS: 5.0000 mg | ORAL_TABLET | Freq: Once | ORAL | Status: DC | PRN

## 2021-08-21 MED ORDER — SUCCINYLCHOLINE CHLORIDE 200 MG/10ML IV SOSY
PREFILLED_SYRINGE | INTRAVENOUS | Status: AC
  Filled 2021-08-21: qty 10

## 2021-08-21 MED ORDER — ACETAMINOPHEN 500 MG PO TABS
ORAL_TABLET | ORAL | Status: AC
  Filled 2021-08-21: qty 2

## 2021-08-21 MED ORDER — BUPIVACAINE-EPINEPHRINE 0.5% -1:200000 IJ SOLN
INTRAMUSCULAR | Status: DC | PRN
  Administered 2021-08-21: 15 mL

## 2021-08-21 MED ORDER — CHLORHEXIDINE GLUCONATE CLOTH 2 % EX PADS: 6.0000 | MEDICATED_PAD | Freq: Once | CUTANEOUS | Status: DC

## 2021-08-21 MED ORDER — DEXAMETHASONE SODIUM PHOSPHATE 4 MG/ML IJ SOLN
INTRAMUSCULAR | Status: DC | PRN
  Administered 2021-08-21: 5 mg via INTRAVENOUS

## 2021-08-21 MED ORDER — DEXAMETHASONE SODIUM PHOSPHATE 10 MG/ML IJ SOLN
INTRAMUSCULAR | Status: AC
  Filled 2021-08-21: qty 1

## 2021-08-21 MED ORDER — DIPHENHYDRAMINE HCL 50 MG/ML IJ SOLN: 25.0000 mg | Freq: Four times a day (QID) | INTRAMUSCULAR | Status: DC | PRN

## 2021-08-21 MED ORDER — ONDANSETRON HCL 4 MG/2ML IJ SOLN: 4.0000 mg | Freq: Once | INTRAMUSCULAR | Status: DC | PRN

## 2021-08-21 SURGICAL SUPPLY — 46 items
APPLIER CLIP 9.375 MED OPEN (MISCELLANEOUS) ×2
BLADE SURG 15 STRL LF DISP TIS (BLADE) ×1 IMPLANT
BLADE SURG 15 STRL SS (BLADE) ×2
CANISTER SUCT 1200ML W/VALVE (MISCELLANEOUS) IMPLANT
CHLORAPREP W/TINT 26 (MISCELLANEOUS) ×2 IMPLANT
CLIP APPLIE 9.375 MED OPEN (MISCELLANEOUS) ×1 IMPLANT
CLIP TI WIDE RED SMALL 6 (CLIP) IMPLANT
COVER BACK TABLE 60X90IN (DRAPES) ×2 IMPLANT
COVER MAYO STAND STRL (DRAPES) ×2 IMPLANT
COVER PROBE W GEL 5X96 (DRAPES) ×2 IMPLANT
DERMABOND ADVANCED (GAUZE/BANDAGES/DRESSINGS) ×2
DERMABOND ADVANCED .7 DNX12 (GAUZE/BANDAGES/DRESSINGS) ×1 IMPLANT
DRAIN CHANNEL 19F RND (DRAIN) ×1 IMPLANT
DRAPE LAPAROSCOPIC ABDOMINAL (DRAPES) ×2 IMPLANT
DRAPE UTILITY XL STRL (DRAPES) ×2 IMPLANT
ELECT REM PT RETURN 9FT ADLT (ELECTROSURGICAL) ×2
ELECTRODE REM PT RTRN 9FT ADLT (ELECTROSURGICAL) ×1 IMPLANT
EVACUATOR SILICONE 100CC (DRAIN) ×1 IMPLANT
GAUZE SPONGE 4X4 12PLY STRL LF (GAUZE/BANDAGES/DRESSINGS) IMPLANT
GLOVE SURG SIGNA 7.5 PF LTX (GLOVE) ×2 IMPLANT
GOWN STRL REUS W/ TWL LRG LVL3 (GOWN DISPOSABLE) ×1 IMPLANT
GOWN STRL REUS W/ TWL XL LVL3 (GOWN DISPOSABLE) ×1 IMPLANT
GOWN STRL REUS W/TWL LRG LVL3 (GOWN DISPOSABLE) ×4
GOWN STRL REUS W/TWL XL LVL3 (GOWN DISPOSABLE) ×2
KIT MARKER MARGIN INK (KITS) ×2 IMPLANT
NDL HYPO 25X1 1.5 SAFETY (NEEDLE) ×1 IMPLANT
NDL SAFETY ECLIPSE 18X1.5 (NEEDLE) ×1 IMPLANT
NEEDLE HYPO 18GX1.5 SHARP (NEEDLE)
NEEDLE HYPO 25X1 1.5 SAFETY (NEEDLE) ×2 IMPLANT
NS IRRIG 1000ML POUR BTL (IV SOLUTION) ×2 IMPLANT
PACK BASIN DAY SURGERY FS (CUSTOM PROCEDURE TRAY) ×2 IMPLANT
PENCIL SMOKE EVACUATOR (MISCELLANEOUS) ×2 IMPLANT
SLEEVE SCD COMPRESS KNEE MED (STOCKING) ×2 IMPLANT
SPIKE FLUID TRANSFER (MISCELLANEOUS) IMPLANT
SPONGE T-LAP 4X18 ~~LOC~~+RFID (SPONGE) ×2 IMPLANT
SUT ETHILON 3 0 PS 1 (SUTURE) ×1 IMPLANT
SUT MNCRL AB 4-0 PS2 18 (SUTURE) ×2 IMPLANT
SUT SILK 2 0 SH (SUTURE) IMPLANT
SUT VIC AB 3-0 SH 27 (SUTURE) ×2
SUT VIC AB 3-0 SH 27X BRD (SUTURE) ×1 IMPLANT
SYR CONTROL 10ML LL (SYRINGE) ×2 IMPLANT
TOWEL GREEN STERILE FF (TOWEL DISPOSABLE) ×2 IMPLANT
TRACER MAGTRACE VIAL (MISCELLANEOUS) IMPLANT
TRAY FAXITRON CT DISP (TRAY / TRAY PROCEDURE) ×2 IMPLANT
TUBE CONNECTING 20X1/4 (TUBING) ×1 IMPLANT
YANKAUER SUCT BULB TIP NO VENT (SUCTIONS) ×1 IMPLANT

## 2021-08-21 NOTE — Anesthesia Preprocedure Evaluation (Signed)
Anesthesia Evaluation  ?Patient identified by MRN, date of birth, ID band ?Patient awake ? ? ? ?Reviewed: ?Allergy & Precautions, NPO status , Patient's Chart, lab work & pertinent test results ? ?History of Anesthesia Complications ?(+) history of anesthetic complications ? ?Airway ?Mallampati: II ? ?TM Distance: >3 FB ?Neck ROM: Full ? ? ?Comment: Pierced tongue Dental ?no notable dental hx. ?(+) Dental Advisory Given, Teeth Intact ?  ?Pulmonary ?shortness of breath and with exertion,  ?  ?Pulmonary exam normal ?breath sounds clear to auscultation ? ? ? ? ? ? Cardiovascular ?negative cardio ROS ?Normal cardiovascular exam ?Rhythm:Regular Rate:Normal ? ? ?  ?Neuro/Psych ?PSYCHIATRIC DISORDERS Adjustment disordernegative neurological ROS ?   ? GI/Hepatic ?Neg liver ROS, GERD  Medicated,  ?Endo/Other  ?Left Breast Ca ? Renal/GU ?negative Renal ROS  ?negative genitourinary ?  ?Musculoskeletal ?negative musculoskeletal ROS ?(+)  ? Abdominal ?(+) - obese,   ?Peds ? Hematology ? ?(+) Blood dyscrasia, anemia ,   ?Anesthesia Other Findings ? ? Reproductive/Obstetrics ? ?  ? ? ? ? ? ? ? ? ? ? ? ? ? ?  ?  ? ? ? ? ? ? ? ? ?Anesthesia Physical ? ?Anesthesia Plan ? ?ASA: 2 ? ?Anesthesia Plan: General  ? ?Post-op Pain Management: Regional block* and Tylenol PO (pre-op)*  ? ?Induction: Intravenous ? ?PONV Risk Score and Plan: 4 or greater and Treatment may vary due to age or medical condition, Midazolam, Ondansetron and Dexamethasone ? ?Airway Management Planned: LMA ? ?Additional Equipment: None ? ?Intra-op Plan:  ? ?Post-operative Plan:  ? ?Informed Consent: I have reviewed the patients History and Physical, chart, labs and discussed the procedure including the risks, benefits and alternatives for the proposed anesthesia with the patient or authorized representative who has indicated his/her understanding and acceptance.  ? ? ? ?Dental advisory given ? ?Plan Discussed with: CRNA and  Anesthesiologist ? ?Anesthesia Plan Comments:   ? ? ? ? ? ? ?Anesthesia Quick Evaluation ? ?

## 2021-08-21 NOTE — Progress Notes (Signed)
Assisted Dr. Royce Macadamia with left, pectoralis block. Side rails up, monitors on throughout procedure. See vital signs in flow sheet. Tolerated Procedure well. ?

## 2021-08-21 NOTE — Anesthesia Postprocedure Evaluation (Signed)
Anesthesia Post Note ? ?Patient: Catherine Ellison ? ?Procedure(s) Performed: LEFT BREAST RADIOACTIVE SEED GUIDED LUMPECTOMY AND COMPLETE LEFT AXILLARY LYMPH NODE DISSECTION (Left) ? ?  ? ?Patient location during evaluation: PACU ?Anesthesia Type: General ?Level of consciousness: awake and alert and oriented ?Pain management: pain level controlled ?Vital Signs Assessment: post-procedure vital signs reviewed and stable ?Respiratory status: spontaneous breathing, nonlabored ventilation and respiratory function stable ?Cardiovascular status: blood pressure returned to baseline and stable ?Postop Assessment: no apparent nausea or vomiting ?Anesthetic complications: no ? ? ?No notable events documented. ? ? ? ?  ?  ?  ?  ?  ?  ? ?Kaliopi Blyden A. ? ? ? ? ?

## 2021-08-21 NOTE — Anesthesia Procedure Notes (Signed)
Anesthesia Regional Block: Pectoralis block  ? ?Pre-Anesthetic Checklist: , timeout performed,  Correct Patient, Correct Site, Correct Laterality,  Correct Procedure, Correct Position, site marked,  Risks and benefits discussed,  Surgical consent,  Pre-op evaluation,  At surgeon's request and post-op pain management ? ?Laterality: Left ? ?Prep: chloraprep     ?  ?Needles:  ?Injection technique: Single-shot ? ?Needle Type: Echogenic Stimulator Needle   ? ? ?Needle Length: 10cm  ?Needle Gauge: 21  ? ?Needle insertion depth: 7 cm ? ? ?Additional Needles: ? ? ?Narrative:  ?Start time: 08/21/2021 2:28 PM ?End time: 08/21/2021 2:33 PM ?Injection made incrementally with aspirations every 5 mL. ? ?Performed by: Personally  ?Anesthesiologist: Josephine Igo, MD ? ?Additional Notes: ?Timeout performed. Patient sedated. Relevant anatomy ID'd using [place]. Incremental 2-50m injection of LA with frequent aspiration. Patient tolerated procedure well. ? ? ? ?Left Pectoralis Block ? ?

## 2021-08-21 NOTE — Transfer of Care (Signed)
Immediate Anesthesia Transfer of Care Note ? ?Patient: Catherine Ellison ? ?Procedure(s) Performed: LEFT BREAST RADIOACTIVE SEED GUIDED LUMPECTOMY AND COMPLETE LEFT AXILLARY LYMPH NODE DISSECTION (Left) ? ?Patient Location: PACU ? ?Anesthesia Type:GA combined with regional for post-op pain ? ?Level of Consciousness: sedated ? ?Airway & Oxygen Therapy: Patient Spontanous Breathing and Patient connected to face mask oxygen ? ?Post-op Assessment: Report given to RN and Post -op Vital signs reviewed and stable ? ?Post vital signs: Reviewed and stable ? ?Last Vitals:  ?Vitals Value Taken Time  ?BP    ?Temp    ?Pulse    ?Resp    ?SpO2    ? ? ?Last Pain:  ?Vitals:  ? 08/21/21 1303  ?TempSrc: Oral  ?PainSc: 0-No pain  ?   ? ?Patients Stated Pain Goal: 5 (08/21/21 1303) ? ?Complications: No notable events documented. ?

## 2021-08-21 NOTE — Anesthesia Procedure Notes (Signed)
Procedure Name: LMA Insertion ?Date/Time: 08/21/2021 3:38 PM ?Performed by: Willa Frater, CRNA ?Pre-anesthesia Checklist: Patient identified, Emergency Drugs available, Suction available and Patient being monitored ?Patient Re-evaluated:Patient Re-evaluated prior to induction ?Oxygen Delivery Method: Circle system utilized ?Preoxygenation: Pre-oxygenation with 100% oxygen ?Induction Type: IV induction ?Ventilation: Mask ventilation without difficulty ?LMA: LMA inserted ?LMA Size: 4.0 ?Number of attempts: 1 ?Airway Equipment and Method: Bite block ?Placement Confirmation: positive ETCO2 ?Tube secured with: Tape ?Dental Injury: Teeth and Oropharynx as per pre-operative assessment  ? ? ? ? ?

## 2021-08-21 NOTE — Op Note (Signed)
Operative Note ? ?Pre-op Diagnosis: Left Breast Cancer s/p neoadjuvant chemotherapy, with resolution of axillary lymphadenopathy after neoadjuvant chemotherapy ?    ?Post-op Diagnosis: same ?  ?Procedure(s): ?radioactive seed localized lumpectomy and axillary lymph node dissection ?  ?Surgeon(s): ?Coralie Keens, MD ? ?Assistant: Cameron Sprang, MD [facility] Resident ?  ?Anesthesia: GEN - LMA/ PEC block ?  ? ?Description of procedure:  ?The patient is brought to the operating room placed in supine position on the operating room table. After an adequate level of general anesthesia was obtained, her left breast was prepped with ChloraPrep and draped in sterile fashion. A timeout was taken to ensure the proper patient and proper procedure. We interrogated the breast with the neoprobe. We made a left upper quadrant curvilinear breast incision over the site of the neoprobe signal after infiltration with Marcaine. Dissection was carried down in the breast tissue with cautery. We used the neoprobe to guide [place] towards the radioactive seed. We excised an area of tissue around the radioactive seed 3 cm in diameter.  This dissection was carried down to the pectoralis muscle and began just inferior to the skin.  The specimen was removed and was oriented with a paint kit. Specimen mammogram showed the clip but not the radioactive seed.  The radioactive seed was retrieved from the deep space and was positive that it had been near the chest wall margin.  Additional medial margin was resected.  Radioactive seed as well as the biopsy clip were identified and removed from the patient.  All specimens were sent for pathologic examination. There was no residual radioactivity within the biopsy cavity. Clips were placed in all margins.  We inspected carefully for hemostasis. The wound was thoroughly irrigated.  ? ?We then turned our attention to the axilla.  A transverse incision was made following the folds of the axilla after infiltration  with Marcaine.  Dissection was carried down with cautery and the clavipectoral fascia was incised to enter the axilla.  Superficial dissection was continued to the border of the pectoralis major and continued superiorly until the axillary vein was identified.  Using a combination of blunt dissection and cautery nodal tissue was separated from the pectoral muscles and latissimus dorsi. The thoracodorsal nerve and long thoracic nerve were identified and preserved.  The axillary specimen was removed and sent to pathology.  A drain was placed into the axillary space and sutured into place with 2-0 nylon.  The clavipectoral fascia was reapproximated with interrupted 3-0 Vicryl. ? ?The wounds were closed with a deep layer of 3-0 Vicryl and a subcuticular layer of 4-0 Monocryl.  Dermabond was applied. The patient was then extubated and brought to the recovery room in stable condition. All sponge, instrument, and needle counts are correct. ? ?Estimated Blood Loss: less than 50 mL ?         ?Complications: None; patient tolerated the procedure well. ?        ?Disposition: Short Stay ?        ?Condition: stable ? ?

## 2021-08-21 NOTE — Interval H&P Note (Signed)
History and Physical Interval Note: no change in H and P ? ?08/21/2021 ?1:00 PM ? ?Catherine Ellison  has presented today for surgery, with the diagnosis of LEFT BREAST CANCER.  The various methods of treatment have been discussed with the patient and family. After consideration of risks, benefits and other options for treatment, the patient has consented to  Procedure(s) with comments: ?LEFT BREAST RADIOACTIVE SEED GUIDED LUMPECTOMY AND COMPLETE LEFT AXILLARY LYMPH NODE DISSECTION (Left) - LMA ?PECTORAL BLOCK as a surgical intervention.  The patient's history has been reviewed, patient examined, no change in status, stable for surgery.  I have reviewed the patient's chart and labs.  Questions were answered to the patient's satisfaction.   ? ? ?Catherine Ellison ? ? ?

## 2021-08-22 ENCOUNTER — Encounter (HOSPITAL_BASED_OUTPATIENT_CLINIC_OR_DEPARTMENT_OTHER): Payer: Self-pay | Admitting: Surgery

## 2021-08-22 DIAGNOSIS — C50412 Malignant neoplasm of upper-outer quadrant of left female breast: Secondary | ICD-10-CM | POA: Diagnosis not present

## 2021-08-22 MED ORDER — OXYCODONE HCL 5 MG PO TABS: 5.0000 mg | ORAL_TABLET | Freq: Four times a day (QID) | ORAL | 0 refills | Status: DC | PRN

## 2021-08-22 NOTE — Discharge Instructions (Addendum)
[facility], [place] ?[phone] ? ?MASTECTOMY: POST OP INSTRUCTIONS ? ?Always review your discharge instruction sheet given to you by the facility where your surgery was performed. ?IF YOU HAVE DISABILITY OR FAMILY LEAVE FORMS, YOU MUST BRING THEM TO THE OFFICE FOR PROCESSING.   ?DO NOT GIVE THEM TO YOUR DOCTOR. ?A prescription for pain medication may be given to you upon discharge.  Take your pain medication as prescribed, if needed.  If narcotic pain medicine is not needed, then you may take acetaminophen (Tylenol) or ibuprofen (Advil) as needed. ?Take your usually prescribed medications unless otherwise directed. ?If you need a refill on your pain medication, please contact your pharmacy.  They will contact our office to request authorization.  Prescriptions will not be filled after 5pm or on week-ends. ?You should follow a light diet the first few days after arrival home, such as soup and crackers, etc.  Resume your normal diet the day after surgery. ?Most patients will experience some swelling and bruising on the chest and underarm.  Ice packs will help.  Swelling and bruising can take several days to resolve.  ?It is common to experience some constipation if taking pain medication after surgery.  Increasing fluid intake and taking a stool softener (such as Colace) will usually help or prevent this problem from occurring.  A mild laxative (Milk of Magnesia or Miralax) should be taken according to package instructions if there are no bowel movements after 48 hours. ?Unless discharge instructions indicate otherwise, leave your bandage dry and in place until your next appointment in 3-5 days.  You may take a limited sponge bath.  No tube baths or showers until the drains are removed.  You may have steri-strips (small skin tapes) in place directly over the incision.  These strips should be left on the skin for 7-10 days.  If your surgeon used skin glue on the incision, you may shower in 24 hours.   The glue will flake off over the next 2-3 weeks.  Any sutures or staples will be removed at the office during your follow-up visit. ?DRAINS:  If you have drains in place, it is important to keep a list of the amount of drainage produced each day in your drains.  Before leaving the hospital, you should be instructed on drain care.  Call our office if you have any questions about your drains. ?ACTIVITIES:  You may resume regular (light) daily activities beginning the next day--such as daily self-care, walking, climbing stairs--gradually increasing activities as tolerated.  You may have sexual intercourse when it is comfortable.  Refrain from any heavy lifting or straining until approved by your doctor. ?You may drive when you are no longer taking prescription pain medication, you can comfortably wear a seatbelt, and you can safely maneuver your car and apply brakes. ?RETURN TO WORK:  __________________________________________________________ ?You should see your doctor in the office for a follow-up appointment approximately 3-5 days after your surgery.  Your doctor?s nurse will typically make your follow-up appointment when she calls you with your pathology report.  Expect your pathology report 2-3 business days after your surgery.  You may call to check if you do not hear from [place] after three days.   ?OTHER INSTRUCTIONS: OK TO SHOWER STARTING TOMORROW ?ICE PACK, TYLENOL, AND IBUPROFEN ALSO FOR PAIN ?NO VIGOROUS ACTIVITY FOR ONE WEEK ?RECORD DRAIN OUTPUT DAILY ?______________________________________________________________________________________________ ____________________________________________________________________________________________ ?WHEN TO CALL YOUR DOCTOR: ?Fever over 101.0 ?Nausea and/or vomiting ?Extreme swelling or bruising ?Continued bleeding from incision. ?Increased pain,  redness, or drainage from the incision. ?The clinic staff is available to answer your questions during regular business hours.   Please don?t hesitate to call and ask to speak to one of the nurses for clinical concerns.  If you have a medical emergency, go to the nearest emergency room or call 911.  A surgeon from [facility] is always on call at the hospital. ?[address] [place], [place], [place]  [address] ? [address], [place], [place]   [address] ?[phone] ? [phone] ? FAX [phone] ?Web site: [email or web address]  ? ? ?Information for Discharge Teaching: ?EXPAREL (bupivacaine liposome injectable suspension)  ? ?Your surgeon or anesthesiologist gave you EXPAREL(bupivacaine) to help control your pain after surgery.  ?EXPAREL is a local anesthetic that provides pain relief by numbing the tissue around the surgical site. ?EXPAREL is designed to release pain medication over time and can control pain for up to 72 hours. ?Depending on how you respond to EXPAREL, you may require less pain medication during your recovery. ? ?Possible side effects: ?Temporary loss of sensation or ability to move in the area where bupivacaine was injected. ?Nausea, vomiting, constipation ?Rarely, numbness and tingling in your mouth or lips, lightheadedness, or anxiety may occur. ?Call your doctor right away if you think you may be experiencing any of these sensations, or if you have other questions regarding possible side effects. ? ?Follow all other discharge instructions given to you by your surgeon or nurse. Eat a healthy diet and drink plenty of water or other fluids. ? ?If you return to the hospital for any reason within 96 hours following the administration of EXPAREL, it is important for health care providers to know that you have received this anesthetic. A teal colored band has been placed on your arm with the date, time and amount of EXPAREL you have received in order to alert and inform your health care providers. Please leave this armband in place for the full 96 hours following administration, and then you may remove the  band.  ?

## 2021-08-22 NOTE — Discharge Summary (Signed)
Physician Discharge Summary  ?Patient ID: ?Catherine Ellison ?MRN: [id number] ?DOB/AGE: Feb 18, 1976 46 y.o. ? ?Admit date: 08/21/2021 ?Discharge date: 08/22/2021 ? ?Admission Diagnoses: ? ?Discharge Diagnoses:  ?Principal Problem: ?  S/P lumpectomy, left breast ?RIGHT BREAST CANCER ? ?Discharged Condition: good ? ?Hospital Course: UNEVENTFUL POST OP RECOVERY.  DISCHARGED HOME POD#1 ? ?Consults: None ? ?Significant Diagnostic Studies: ? ?Treatments: surgery: radioactive seed guided left breast lumpectomy and axillary node dissection ? ?Discharge Exam: ?Blood pressure 107/62, pulse 80, temperature 98.1 ?F (36.7 ?C), resp. rate 16, height '5\' 11"'$  (1.803 m), weight 86 kg, SpO2 100 %. ?General appearance: alert, cooperative, and no distress ?Resp: clear to auscultation bilaterally ?Cardio: regular rate and rhythm, S1, S2 normal, no murmur, click, rub or gallop ?Incision/Wound: incisions healing well, drain serosang ? ?Disposition: Discharge disposition: 01-Home or Self Care ? ? ? ? ? ? ? ?Allergies as of 08/22/2021   ? ?   Reactions  ? Latex Rash  ? ?  ? ?  ?Medication List  ?  ? ?TAKE these medications   ? ?acetaminophen 325 MG tablet ?Commonly known as: TYLENOL ?Take 650 mg by mouth every 6 (six) hours as needed. ?  ?dexamethasone 4 MG tablet ?Commonly known as: DECADRON ?Take 2 tablets (8 mg total) by mouth daily. Take daily for 3 days after chemo. Take with food. ?  ?dicyclomine 20 MG tablet ?Commonly known as: BENTYL ?Take 1 tablet (20 mg total) by mouth every 6 (six) hours. ?  ?DULoxetine 20 MG capsule ?Commonly known as: CYMBALTA ?Take 2 capsules (40 mg total) by mouth daily. ?  ?famotidine 20 MG tablet ?Commonly known as: PEPCID ?Take 1 tablet (20 mg total) by mouth 2 (two) times daily. ?  ?gabapentin 400 MG capsule ?Commonly known as: NEURONTIN ?Take 1 capsule (400 mg total) by mouth 3 (three) times daily. ?  ?ibuprofen 800 MG tablet ?Commonly known as: ADVIL ?Take 800 mg by mouth. ?  ?lidocaine-prilocaine  cream ?Commonly known as: EMLA ?Apply to affected area once ?  ?linaclotide 72 MCG capsule ?Commonly known as: LINZESS ?Take 1 capsule (72 mcg total) by mouth daily. linzess ?  ?LORazepam 0.5 MG tablet ?Commonly known as: Ativan ?Take 1 tablet (0.5 mg total) by mouth every 6 (six) hours as needed (Nausea or vomiting). ?  ?magic mouthwash w/lidocaine Soln ?Take 5 mLs by mouth 4 (four) times daily as needed for mouth pain. ?  ?melatonin 3 MG Tabs tablet ?Take 1 tablet (3 mg total) by mouth at bedtime. ?What changed:  ?when to take this ?reasons to take this ?  ?ondansetron 8 MG tablet ?Commonly known as: Zofran ?Take 1 tablet (8 mg total) by mouth 2 (two) times daily as needed (Nausea or vomiting). ?  ?oxyCODONE 5 MG immediate release tablet ?Commonly known as: Oxy IR/ROXICODONE ?Take 1 tablet (5 mg total) by mouth every 6 (six) hours as needed for severe pain or moderate pain. ?What changed:  ?how much to take ?when to take this ?reasons to take this ?  ?pantoprazole 40 MG tablet ?Commonly known as: Protonix ?Take 1 tablet (40 mg total) by mouth daily. ?  ?prochlorperazine 10 MG tablet ?Commonly known as: COMPAZINE ?TAKE 1 TABLET ('10MG'$ ) BY MOUTH EVERY 6 HOURS AS NEEDED FOR NAUSEA OR VOMITING ?  ?venlafaxine XR 150 MG 24 hr capsule ?Commonly known as: EFFEXOR-XR ?Take 1 capsule (150 mg total) by mouth daily with breakfast. Take 1 tablet daily ?  ? ?  ? ? Follow-up Information   ? ?  Coralie Keens, MD Follow up on 09/06/2021.   ?Specialty: General Surgery ?Why: for drain removal ?Contact information: ?[address] ?[address] ?[place] [address] ?[phone] ? ? ?  ?  ? ?  ?  ? ?  ? ? ?Signed: ?Coralie Keens ?08/22/2021, 7:18 AM ? ? ?

## 2021-08-22 NOTE — Progress Notes (Signed)
? ?Patient Care Team: ?[name], NP as PCP - General (Adult Health Nurse Practitioner) ?Coralie Keens, MD as Consulting Physician (General Surgery) ?Eppie Gibson, MD as Attending Physician (Radiation Oncology) ?Rockwell Germany, RN as Oncology Nurse Navigator ?Mauro Kaufmann, RN as Oncology Nurse Navigator ?[name], NP as Nurse Practitioner (Hematology and Oncology) ?Nicholas Lose, MD as Consulting Physician (Hematology and Oncology) ? ?DIAGNOSIS:  ?Encounter Diagnosis  ?Name Primary?  ? Malignant neoplasm of upper-outer quadrant of left breast in female, estrogen receptor positive ([place])   ? ? ?SUMMARY OF ONCOLOGIC HISTORY: ?Oncology History  ?Malignant neoplasm of upper-outer quadrant of left breast in female, estrogen receptor positive ([place])  ?02/11/2021 Initial Diagnosis  ? left breast upper outer quadrant biopsy 02/07/2021 for a clinical T2 Ni, stage IIIA invasive ductal carcinoma, grade 3, ER 70% weak, PR 0% and HER2 negative, Ki-67: 40% ?  ?02/13/2021 Cancer Staging  ? Staging form: Breast, AJCC 8th Edition ?- Clinical stage from 02/13/2021: Stage IIIA (cT2, cN1, cM0, G3, ER+, PR-, HER2-) - Signed by [name], MD on 02/13/2021 ?Stage prefix: Initial diagnosis ?Histologic grading system: 3 grade system ?Laterality: Left ?Staged by: Pathologist and managing physician ?Stage used in treatment planning: Yes ?National guidelines used in treatment planning: Yes ?Type of national guideline used in treatment planning: NCCN ? ?  ?02/19/2021 Genetic Testing  ? 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 The report date is 02/26/2021. ?  ?The CustomNext-Cancer+RNAinsight panel offered by [facility] includes sequencing and rearrangement analysis for the following 47 genes:  APC, ATM, AXIN2, BARD1, BMPR1A,  BRCA1, BRCA2, BRIP1, CDH1, CDK4, CDKN2A, CHEK2, DICER1, EPCAM, GREM1, HOXB13, MEN1, MLH1, MSH2, MSH3, MSH6, MUTYH, NBN, NF1, NF2, NTHL1, PALB2, PMS2, POLD1, POLE, PTEN, RAD51C, RAD51D, RECQL, RET, SDHA, SDHAF2, SDHB, SDHC, SDHD, SMAD4, SMARCA4, STK11, TP53, TSC1, TSC2, and VHL.  RNA data is routinely analyzed for use in variant interpretation for all genes. ?  ?03/01/2021 -  Neo-Adjuvant Chemotherapy  ? neoadjuvant chemotherapy will consist of paclitaxel weekly x12 followed by dose dense doxorubicin and cyclophosphamide x4 ?  ?08/21/2021 Surgery  ? Left lumpectomy: Negative for residual cancer.  Margins negative, 0/14 lymph nodes negative ?  ? ? ?CHIEF COMPLIANT: Cycle 4 Adriamycin and Cytoxan   ?  ? ?INTERVAL HISTORY: Catherine Ellison is a 46 y.o. with above-mentioned history of left breast cancer currently on chemotherapy with Adriamycin and Cytoxan. She presents to the clinic today for treatment.  ? ? ?ALLERGIES:  is allergic to latex. ? ?MEDICATIONS:  ?Current Outpatient Medications  ?Medication Sig Dispense Refill  ? acetaminophen (TYLENOL) 325 MG tablet Take 650 mg by mouth every 6 (six) hours as needed.    ? dexamethasone (DECADRON) 4 MG tablet Take 2 tablets (8 mg total) by mouth daily. Take daily for 3 days after chemo. Take with food. 30 tablet 1  ? dicyclomine (BENTYL) 20 MG tablet Take 1 tablet (20 mg total) by mouth every 6 (six) hours. 30 tablet 3  ? DULoxetine (CYMBALTA) 20 MG capsule Take 2 capsules (40 mg total) by mouth daily. 30 capsule 6  ? famotidine (PEPCID) 20 MG tablet Take 1 tablet (20 mg total) by mouth 2 (two) times daily. 60 tablet 5  ? gabapentin (NEURONTIN) 400 MG capsule Take 1 capsule (400 mg total) by mouth 3 (three) times daily. [place]  capsule 3  ? ibuprofen (ADVIL) 800 MG tablet Take 800 mg by mouth.    ? lidocaine-prilocaine (EMLA) cream Apply to affected area once 30 g 3  ? linaclotide (LINZESS) 72 MCG capsule Take 1 capsule (72 mcg total) by mouth daily. linzess 90 capsule 3  ?  LORazepam (ATIVAN) 0.5 MG tablet Take 1 tablet (0.5 mg total) by mouth every 6 (six) hours as needed (Nausea or vomiting). 30 tablet 0  ? magic mouthwash w/lidocaine SOLN Take 5 mLs by mouth 4 (four) times daily as needed for mouth pain. 240 mL 1  ? Melatonin 3 MG TABS Take 1 tablet (3 mg total) by mouth at bedtime. (Patient taking differently: Take 3 mg by mouth at bedtime as needed.) 60 tablet 0  ? ondansetron (ZOFRAN) 8 MG tablet Take 1 tablet (8 mg total) by mouth 2 (two) times daily as needed (Nausea or vomiting). 30 tablet 1  ? oxyCODONE (OXY IR/ROXICODONE) 5 MG immediate release tablet Take 1 tablet (5 mg total) by mouth every 6 (six) hours as needed for severe pain or moderate pain. 30 tablet 0  ? pantoprazole (PROTONIX) 40 MG tablet Take 1 tablet (40 mg total) by mouth daily. 90 tablet 3  ? prochlorperazine (COMPAZINE) 10 MG tablet TAKE 1 TABLET (10MG) BY MOUTH EVERY 6 HOURS AS NEEDED FOR NAUSEA OR VOMITING 30 tablet 1  ? venlafaxine XR (EFFEXOR-XR) 150 MG 24 hr capsule Take 1 capsule (150 mg total) by mouth daily with breakfast. Take 1 tablet daily 90 capsule 3  ? ?No current facility-administered medications for this visit.  ? ? ?PHYSICAL EXAMINATION: ?ECOG PERFORMANCE STATUS: 1 - Symptomatic but completely ambulatory ? ?Vitals:  ? 09/03/21 1043  ?BP: (!) 145/97  ?Pulse: (!) 117  ?Resp: 18  ?Temp: (!) 97.3 ?F (36.3 ?C)  ?SpO2: 100%  ? ?Filed Weights  ? 09/03/21 1043  ?Weight: 195 lb 11.2 oz (88.8 kg)  ? ?  ? ?LABORATORY DATA:  ?I have reviewed the data as listed ? ?  Latest Ref Rng & Units 07/11/2021  ? 11:09 AM 06/27/2021  ? 10:11 AM 06/13/2021  ? 10:04 AM  ?CMP  ?Glucose 70 - 99 mg/dL 100   119   98    ?BUN 6 - 20 mg/dL '11   12   16    ' ?Creatinine 0.44 - 1.00 mg/dL 0.88   0.78   0.99    ?Sodium 135 - 145 mmol/L 138   137   136    ?Potassium 3.5 - 5.1 mmol/L 3.9   4.0   4.3    ?Chloride 98 - 111 mmol/L 104   105   103    ?CO2 22 - 32 mmol/L '28   25   29    ' ?Calcium 8.9 - 10.3 mg/dL 9.2   9.2   9.1    ?Total  Protein 6.5 - 8.1 g/dL 6.9   6.8   6.8    ?Total Bilirubin 0.3 - 1.2 mg/dL 0.3   0.3   0.3    ?Alkaline Phos 38 - 126 U/L 105   124   108    ?AST 15 - 41 U/L '23   16   20    ' ?ALT 0 - 44 U/L '23   18   27    ' ? ? ?Lab Results  ?Component Value Date  ? WBC 10.4 07/11/2021  ? HGB 9.0 (L) 07/11/2021  ? HCT 29.0 (L) 07/11/2021  ? MCV 86.3 07/11/2021  ?  PLT 374 07/11/2021  ? NEUTROABS 7.4 07/11/2021  ? ? ?ASSESSMENT & PLAN:  ?Malignant neoplasm of upper-outer quadrant of left breast in female, estrogen receptor positive ([place]) ?left breast upper outer quadrant biopsy 02/07/2021 for a clinical T2 Ni, stage IIIA invasive ductal carcinoma, grade 3, ER 70% weak, PR 0% and HER2 negative, Ki-67: 40% ?Stage IIIa ?  ?Treatment plan: ?1.  Neoadjuvant chemotherapy with weekly Taxol x12 followed by dose dense Adriamycin and Cytoxan  ?2. 08/21/2021:Left lumpectomy: Negative for residual cancer.  Margins negative, 0/14 lymph nodes negative ?3.  Followed by radiation ?4.  Followed by antiestrogen therapy ?-------------------------------------------------------------------------------------------------------------------------------- ?Pathology counseling: I discussed the final pathology report of the patient provided  a copy of this report. I discussed the margins as well as lymph node surgeries. We also discussed the final staging along with previously performed ER/PR and HER-2/neu testing. ? ?Return to clinic after radiation to start antiestrogen therapy. ? ? ? ?No orders of the defined types were placed in this encounter. ? ?The patient has a good understanding of the overall plan. she agrees with it. she will call with any problems that may develop before the next visit here. ?Total time spent: 30 mins including face to face time and time spent for planning, charting and co-ordination of care ? ? Harriette Ohara, MD ?09/03/21 ? ? ? I Gardiner Coins am scribing for Dr. Lindi Adie ? ?I have reviewed the above documentation for accuracy and  completeness, and I agree with the above. ?. ?

## 2021-08-23 LAB — SURGICAL PATHOLOGY

## 2021-08-28 ENCOUNTER — Encounter: Payer: Self-pay | Admitting: *Deleted

## 2021-09-03 ENCOUNTER — Encounter: Payer: Self-pay | Admitting: *Deleted

## 2021-09-03 ENCOUNTER — Inpatient Hospital Stay: Payer: Managed Care, Other (non HMO) | Attending: Oncology | Admitting: Hematology and Oncology

## 2021-09-03 ENCOUNTER — Other Ambulatory Visit: Payer: Self-pay

## 2021-09-03 DIAGNOSIS — Z79899 Other long term (current) drug therapy: Secondary | ICD-10-CM | POA: Insufficient documentation

## 2021-09-03 DIAGNOSIS — Z170 Estrogen receptor positive status [ER+]: Secondary | ICD-10-CM | POA: Diagnosis not present

## 2021-09-03 DIAGNOSIS — C50412 Malignant neoplasm of upper-outer quadrant of left female breast: Secondary | ICD-10-CM | POA: Diagnosis present

## 2021-09-03 NOTE — Assessment & Plan Note (Signed)
left breast upper outer quadrant biopsy 02/07/2021 for a clinical?T2 Ni, stage IIIA invasive ductal carcinoma, grade 3, ER 70% weak, PR 0% and HER2 negative, Ki-67: 40% ?Stage IIIa ?? ?Treatment plan: ?1.??Neoadjuvant chemotherapy with weekly Taxol x12 followed by dose dense Adriamycin and Cytoxan  ?2.?08/21/2021:Left lumpectomy: Negative for residual cancer.  Margins negative, 0/14 lymph nodes negative ?3.??Followed by radiation ?4.??Followed by antiestrogen therapy ?-------------------------------------------------------------------------------------------------------------------------------- ?Pathology counseling: I discussed the final pathology report of the patient provided  a copy of this report. I discussed the margins as well as lymph node surgeries. We also discussed the final staging along with previously performed ER/PR and HER-2/neu testing. ? ?Return to clinic after radiation to start antiestrogen therapy. ?

## 2021-09-05 ENCOUNTER — Encounter (HOSPITAL_COMMUNITY): Payer: Self-pay

## 2021-09-11 ENCOUNTER — Encounter: Payer: Self-pay | Admitting: Hematology and Oncology

## 2021-09-17 ENCOUNTER — Other Ambulatory Visit: Payer: Self-pay | Admitting: Surgery

## 2021-09-17 NOTE — Progress Notes (Signed)
?Radiation Oncology         [phone] ?________________________________ ? ?Name: Catherine Ellison MRN: [id number]  ?Date: 09/18/2021  DOB: March 08, 1976 ? ?Follow-Up Visit Note ? ?Outpatient ? ?CC: [name], NP  Nicholas Lose, MD ? ?Diagnosis:    ?  ICD-10-CM   ?1. Malignant neoplasm of upper-outer quadrant of left breast in female, estrogen receptor positive ([place])  C50.412   ? Z17.0   ?  ?  ? ?S/p chemotherapy and lumpectomy: Left Breast UOQ, No residual carcinoma ? ? Cancer Staging  ?Malignant neoplasm of upper-outer quadrant of left breast in female, estrogen receptor positive ([place]) ?Staging form: Breast, AJCC 8th Edition ?- Clinical stage from 02/13/2021: Stage IIIA (cT2, cN1, cM0, G3, ER+, PR-, HER2-) - Signed by [name], MD on 02/13/2021 ?ypT0 ypN0 ? ?CHIEF COMPLAINT: Here to discuss management of left breast cancer ? ?Narrative:  The patient returns today for follow-up.   ?  ?Since breast clinic consultation date of 02/13/22, she underwent genetic testing on that same date which revealed no clinically actionable alterations by BRCAplus, though a variant of unknown significance ([id number]) was detected in the ATM gene by BRCAplus testing. 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 ? ?Pertinent imaging performed in the interval since her initial consultation includes: ?-- Bilateral breast MRI on 02/22/21 showed the biopsy proven malignancy in the lateral left breast measuring ?5.9 cm, at least 5 abnormal appearing left axillary lymph nodes, and no evidence of malignancy in the right breast. MRI also showed a 7.2 cm mass within the liver, likely reflective of a cyst. ?-- Chest CT on 02/26/21 which showed the 5.0 x 4.8 x 5.1 cm mass in the lateral aspect of the left breast with left axillary and left subpectoral lymphadenopathy. No signs of metastatic disease to  the lungs or internal mammary lymphadenopathy were otherwise appreciated. (CT did however show the smoothly marginated ovoid shaped low-attenuation lesion in the right breast measuring 3.5 x 2.1 x 3.4 cm, which was previously characterized as a large cyst on prior breast MRI 02/22/2021). ?-- Whole body bone scan on 02/26/21 showed no evidence of osseous metastases.  ?--Bilateral breast MRI on 07/12/21 demonstrated a marked decrease in size of the biopsy proven malignancy in the upper outer left breast, with a residual 2.5 cm span of non mass enhancement at the same site. MRI also showed a decrease in size of the multiple abnormal left axillary lymph nodes seen on the prior study, and no evidence of malignancy in the right breast. ? ?I have reviewed her imaging personally. ? ?Systemic therapy, if applicable, involved (dates and therapy as follows): The patient has completed 12 cycles of neo-adjuvant chemotherapy consisting of taxol, starting on 03/01/21 through 05/22/21 under the care of Dr. Lindi Adie.  This was followed by dose dense Adriamycin and Cytoxan starting on 05/29/21 through. Chemo toxicities encountered by the patient include chemo induced anemia, bone pain, shortness of breath, and severe acid reflux.  ? ?The patient opted to proceed with left breast lumpectomy and nodal dissections on 08/21/21 under the care of Dr. Ninfa Linden. Pathology from the procedure revealed: no evidence of residual carcinoma, (with fibrocystic changes including stromal fibrosis and adenosis); all margins negative for carcinoma; nodal status of 14/14 dissected left axillary lymph nodes negative for carcinoma. ? ?Following radiation, the patient will return to Dr. Lindi Adie to discuss antiestrogen treatment options.  ? ?  Symptomatically, the patient reports:   ?Lymphedema issues, if any:  Reports occasional swelling the backside of her left arm (mainly bicep/triceps area) ? ?Pain issues, if any:  Reports persistent pain to the lateral  aspect of her left breast (near surgical site). As well as pain down the posterior/lateral aspect of her left arm. States she has some range of motion limitations to the left arm also ? ?SAFETY ISSUES: ?Prior radiation? No ?Pacemaker/ICD? No ?Possible current pregnancy? No--menstrual cycles have not returned since completing chemotherapy  ?Is the patient on methotrexate? No ? ?Current Complaints / other details:  Reports JP drain was removed yesterday ?   ?She is working as a Marine scientist ?       ?ALLERGIES:  is allergic to latex. ? ?Meds: ?Current Outpatient Medications  ?Medication Sig Dispense Refill  ? acetaminophen (TYLENOL) 325 MG tablet Take 650 mg by mouth every 6 (six) hours as needed.    ? dexamethasone (DECADRON) 4 MG tablet Take 2 tablets (8 mg total) by mouth daily. Take daily for 3 days after chemo. Take with food. 30 tablet 1  ? dicyclomine (BENTYL) 20 MG tablet Take 1 tablet (20 mg total) by mouth every 6 (six) hours. 30 tablet 3  ? DULoxetine (CYMBALTA) 20 MG capsule Take 2 capsules (40 mg total) by mouth daily. 30 capsule 6  ? famotidine (PEPCID) 20 MG tablet Take 1 tablet (20 mg total) by mouth 2 (two) times daily. 60 tablet 5  ? gabapentin (NEURONTIN) 400 MG capsule Take 1 capsule (400 mg total) by mouth 3 (three) times daily. 180 capsule 3  ? ibuprofen (ADVIL) 800 MG tablet Take 800 mg by mouth.    ? lidocaine-prilocaine (EMLA) cream Apply to affected area once 30 g 3  ? linaclotide (LINZESS) 72 MCG capsule Take 1 capsule (72 mcg total) by mouth daily. linzess 90 capsule 3  ? LORazepam (ATIVAN) 0.5 MG tablet Take 1 tablet (0.5 mg total) by mouth every 6 (six) hours as needed (Nausea or vomiting). 30 tablet 0  ? magic mouthwash w/lidocaine SOLN Take 5 mLs by mouth 4 (four) times daily as needed for mouth pain. 240 mL 1  ? Melatonin 3 MG TABS Take 1 tablet (3 mg total) by mouth at bedtime. (Patient taking differently: Take 3 mg by mouth at bedtime as needed.) 60 tablet 0  ? ondansetron (ZOFRAN) 8 MG  tablet Take 1 tablet (8 mg total) by mouth 2 (two) times daily as needed (Nausea or vomiting). 30 tablet 1  ? oxyCODONE (OXY IR/ROXICODONE) 5 MG immediate release tablet Take 1 tablet (5 mg total) by mouth every 6 (six) hours as needed for severe pain or moderate pain. 30 tablet 0  ? pantoprazole (PROTONIX) 40 MG tablet Take 1 tablet (40 mg total) by mouth daily. 90 tablet 3  ? prochlorperazine (COMPAZINE) 10 MG tablet TAKE 1 TABLET (10MG) BY MOUTH EVERY 6 HOURS AS NEEDED FOR NAUSEA OR VOMITING 30 tablet 1  ? venlafaxine XR (EFFEXOR-XR) 150 MG 24 hr capsule Take 1 capsule (150 mg total) by mouth daily with breakfast. Take 1 tablet daily 90 capsule 3  ? ?No current facility-administered medications for this encounter.  ? ? ?Physical Findings: ? height is _0  (1.803 m) and weight is 199 lb 6 oz (90.4 kg). Her temporal temperature is 97 ?F (36.1 ?C) (abnormal). Her blood pressure is 148/99 (abnormal) and her pulse is 108 (abnormal). Her respiration is 18 and oxygen saturation is 100%. .    ? ?  General: Alert and oriented, in no acute distress ?Musculoskeletal: symmetric strength and muscle tone throughout. Decent ROM in shoulders, slightly less on left ?Psychiatric: Judgment and insight are intact. Affect is appropriate. ?Breast exam reveals satisfactory healing of left lumpectomy and axillar scars. Small open area where JP drain was removed recently. ? ?Lab Findings: ?Lab Results  ?Component Value Date  ? WBC 10.4 07/11/2021  ? HGB 9.0 (L) 07/11/2021  ? HCT 29.0 (L) 07/11/2021  ? MCV 86.3 07/11/2021  ? PLT 374 07/11/2021  ? ? ?_0 @ ? ?Radiographic Findings: ?MM Breast Surgical Specimen ? ?Result Date: 08/21/2021 ?CLINICAL DATA:  Status post seed localized LEFT lumpectomy. EXAM: SPECIMEN RADIOGRAPH OF THE LEFT BREAST COMPARISON:  Previous exam(s). FINDINGS: Status post excision of the left breast. The coil shaped clip is identified within the tissue specimen. Specimen radiograph is performed of the specimen  cup, revealing the radioactive seed to be present. Findings are discussed with the operating room nurse at time of interpretation. IMPRESSION: Specimen radiograph of the left breast. Electronically Signed   By:

## 2021-09-17 NOTE — Progress Notes (Signed)
Location of Breast Cancer:  ?Malignant neoplasm of upper-outer quadrant of left breast in female, estrogen receptor positive  ? ?Histology per Pathology Report:  ?08/21/2021 ?FINAL MICROSCOPIC DIAGNOSIS:  ?A. BREAST, LEFT, LUMPECTOMY:  ?- Negative for residual carcinoma (ypT0)  ?- Changes consistent with prior biopsy and neoadjuvant therapy with associated calcifications  ?- Fibrocystic changes including stromal fibrosis and adenosis  ?- Multifocal fibromatoid change  ?B. BREAST, LEFT NEW MARGIN, EXCISION:  ?- Benign breast with fibrocystic changes including stromal fibrosis and adenosis  ?- Fibromatoid change  ?- Negative for carcinoma  ?C. LYMPH NODES, LEFT AXILLARY, DISSECTION:  ?- Fourteen benign lymph nodes, negative for carcinoma (0/14, ypN0)  ?- One node shows changes consistent with a prior biopsy and neoadjuvant therapy  ? ?02/12/2021 ?Diagnosis ?Lymph node, needle/core biopsy, left axilla ?- METASTATIC CARCINOMA INVOLVING NODAL TISSUE ? ?02/07/2021 ?Diagnosis ?Breast, left, needle core biopsy, 2 o'clock ?- INVASIVE DUCTAL CARCINOMA, GRADE 3. ? ?Receptor Status: ER(70%), PR (0%), Her2-neu (Negative via FISH), Ki-67(40%) ? ?Did patient present with symptoms (if so, please note symptoms) or was this found on screening mammography?: She underwent bilateral diagnostic mammography with tomography and bilateral breast ultrasonography at [facility] on 01/30/2021 showing: breast density category C; bilateral fibrocystic changes; 1.4 cm irregular mass in right breast at 3 o'clock; palpable, indeterminate 4.1 cm left breast mass at 2 o'clock; three abnormal nodes in left axilla; right axilla not imaged. ? ?Past/Anticipated interventions by surgeon, if any:  ?08/21/2021 ?--Dr. Vicente Males Louie/Dr. Coralie Keens ?Radioactive seed localized lumpectomy  ?Axillary lymph node dissection ? ?Past/Anticipated interventions by medical oncology, if any:  ?Under care of Dr. Nicholas Lose ?09/03/2021 ?--Treatment  plan: ?Neoadjuvant chemotherapy with weekly Taxol x12 followed by dose dense Adriamycin and Cytoxan  ?08/21/2021:Left lumpectomy: Negative for residual cancer.  Margins negative, 0/14 lymph nodes negative ?Followed by radiation ?Followed by antiestrogen therapy ?--Return to clinic after radiation to start antiestrogen therapy. ?  ?Lymphedema issues, if any:  Reports occasional swelling the backside of her left arm (mainly bicep/triceps area) ? ?Pain issues, if any:  Reports persistent pain to the lateral aspect of her left breast (near surgical site). As well as pain down the posterior/lateral aspect of her left arm. States she has some range of motion limitations to the left arm also ? ?SAFETY ISSUES: ?Prior radiation? No ?Pacemaker/ICD? No ?Possible current pregnancy? No--menstrual cycles have not returned since completing chemotherapy  ?Is the patient on methotrexate? No ? ?Current Complaints / other details:  Reports JP drain was removed yesterday ?   ? ? ? ?

## 2021-09-18 ENCOUNTER — Ambulatory Visit
Admission: RE | Admit: 2021-09-18 | Discharge: 2021-09-18 | Disposition: A | Discharge status: Home / Self Care | Payer: Managed Care, Other (non HMO) | Source: Ambulatory Visit | Attending: Radiation Oncology | Admitting: Radiation Oncology

## 2021-09-18 ENCOUNTER — Other Ambulatory Visit: Payer: Self-pay

## 2021-09-18 ENCOUNTER — Encounter: Payer: Self-pay | Admitting: Hematology and Oncology

## 2021-09-18 ENCOUNTER — Encounter: Payer: Self-pay | Admitting: Radiation Oncology

## 2021-09-18 VITALS — BP 148/99 | HR 108 | Temp 97.0°F | Resp 18 | Ht 71.0 in | Wt 199.4 lb

## 2021-09-18 DIAGNOSIS — C50412 Malignant neoplasm of upper-outer quadrant of left female breast: Secondary | ICD-10-CM

## 2021-09-18 DIAGNOSIS — R591 Generalized enlarged lymph nodes: Secondary | ICD-10-CM | POA: Diagnosis not present

## 2021-09-18 DIAGNOSIS — Z170 Estrogen receptor positive status [ER+]: Secondary | ICD-10-CM | POA: Insufficient documentation

## 2021-09-18 DIAGNOSIS — Z79899 Other long term (current) drug therapy: Secondary | ICD-10-CM | POA: Insufficient documentation

## 2021-09-18 DIAGNOSIS — Z510 Encounter for antineoplastic radiation therapy: Secondary | ICD-10-CM | POA: Diagnosis not present

## 2021-09-18 DIAGNOSIS — Z7952 Long term (current) use of systemic steroids: Secondary | ICD-10-CM | POA: Insufficient documentation

## 2021-09-18 LAB — PREGNANCY, URINE: Preg Test, Ur: NEGATIVE

## 2021-09-24 ENCOUNTER — Encounter: Payer: Self-pay | Admitting: *Deleted

## 2021-09-24 DIAGNOSIS — Z510 Encounter for antineoplastic radiation therapy: Secondary | ICD-10-CM | POA: Diagnosis not present

## 2021-09-25 ENCOUNTER — Telehealth: Payer: Self-pay | Admitting: Hematology and Oncology

## 2021-09-25 NOTE — Telephone Encounter (Signed)
.  Called patient to schedule appointment per 5/10 inbasket, patient is aware of date and time.   ?

## 2021-09-30 ENCOUNTER — Ambulatory Visit
Admission: RE | Admit: 2021-09-30 | Discharge: 2021-09-30 | Disposition: A | Discharge status: Home / Self Care | Payer: Managed Care, Other (non HMO) | Source: Ambulatory Visit | Attending: Radiation Oncology | Admitting: Radiation Oncology

## 2021-09-30 ENCOUNTER — Other Ambulatory Visit: Payer: Self-pay

## 2021-09-30 ENCOUNTER — Encounter: Payer: Self-pay | Admitting: Hematology and Oncology

## 2021-09-30 DIAGNOSIS — Z510 Encounter for antineoplastic radiation therapy: Secondary | ICD-10-CM | POA: Diagnosis not present

## 2021-09-30 DIAGNOSIS — C50412 Malignant neoplasm of upper-outer quadrant of left female breast: Secondary | ICD-10-CM

## 2021-09-30 LAB — RAD ONC ARIA SESSION SUMMARY
Course Elapsed Days: 0
Plan Fractions Treated to Date: 1
Plan Fractions Treated to Date: 1
Plan Prescribed Dose Per Fraction: 2 Gy
Plan Prescribed Dose Per Fraction: 2 Gy
Plan Total Fractions Prescribed: 25
Plan Total Fractions Prescribed: 25
Plan Total Prescribed Dose: 50 Gy
Plan Total Prescribed Dose: 50 Gy
Reference Point Dosage Given to Date: 2 Gy
Reference Point Dosage Given to Date: 2 Gy
Reference Point Session Dosage Given: 2 Gy
Reference Point Session Dosage Given: 2 Gy
Session Number: 1

## 2021-09-30 MED ORDER — RADIAPLEXRX EX GEL: Freq: Once | CUTANEOUS | Status: AC

## 2021-09-30 MED ORDER — ALRA NON-METALLIC DEODORANT (RAD-ONC)
1.0000 "application " | Freq: Once | TOPICAL | Status: AC
  Administered 2021-09-30: 1 via TOPICAL

## 2021-09-30 NOTE — Progress Notes (Signed)

## 2021-10-01 ENCOUNTER — Ambulatory Visit
Admission: RE | Admit: 2021-10-01 | Discharge: 2021-10-01 | Disposition: A | Discharge status: Home / Self Care | Payer: Managed Care, Other (non HMO) | Source: Ambulatory Visit | Attending: Radiation Oncology | Admitting: Radiation Oncology

## 2021-10-01 ENCOUNTER — Other Ambulatory Visit: Payer: Self-pay

## 2021-10-01 ENCOUNTER — Inpatient Hospital Stay: Payer: Managed Care, Other (non HMO) | Admitting: Hematology and Oncology

## 2021-10-01 DIAGNOSIS — M549 Dorsalgia, unspecified: Secondary | ICD-10-CM | POA: Insufficient documentation

## 2021-10-01 DIAGNOSIS — Z510 Encounter for antineoplastic radiation therapy: Secondary | ICD-10-CM | POA: Diagnosis not present

## 2021-10-01 DIAGNOSIS — Z79899 Other long term (current) drug therapy: Secondary | ICD-10-CM | POA: Insufficient documentation

## 2021-10-01 DIAGNOSIS — G629 Polyneuropathy, unspecified: Secondary | ICD-10-CM | POA: Insufficient documentation

## 2021-10-01 DIAGNOSIS — Z170 Estrogen receptor positive status [ER+]: Secondary | ICD-10-CM | POA: Insufficient documentation

## 2021-10-01 DIAGNOSIS — R110 Nausea: Secondary | ICD-10-CM | POA: Insufficient documentation

## 2021-10-01 DIAGNOSIS — C50412 Malignant neoplasm of upper-outer quadrant of left female breast: Secondary | ICD-10-CM | POA: Insufficient documentation

## 2021-10-01 LAB — RAD ONC ARIA SESSION SUMMARY
Course Elapsed Days: 1
Plan Fractions Treated to Date: 2
Plan Fractions Treated to Date: 2
Plan Prescribed Dose Per Fraction: 2 Gy
Plan Prescribed Dose Per Fraction: 2 Gy
Plan Total Fractions Prescribed: 25
Plan Total Fractions Prescribed: 25
Plan Total Prescribed Dose: 50 Gy
Plan Total Prescribed Dose: 50 Gy
Reference Point Dosage Given to Date: 4 Gy
Reference Point Dosage Given to Date: 4 Gy
Reference Point Session Dosage Given: 2 Gy
Reference Point Session Dosage Given: 2 Gy
Session Number: 2

## 2021-10-01 NOTE — Assessment & Plan Note (Deleted)
left breast upper outer quadrant biopsy 02/07/2021 for a clinicalT2 Ni, stage IIIA invasive ductal carcinoma, grade 3, ER 70% weak, PR 0% and HER2 negative, Ki-67: 40% Stage IIIa  Treatment plan: 1.Neoadjuvant chemotherapy with weekly Taxol x12 followed by dose dense Adriamycin and Cytoxan -03/01/2021-07/11/2021  2.08/21/2021:Left lumpectomy: Negative for residual cancer. Margins negative, 0/14 lymph nodes negative 3.Followed by radiation 10/01/2021-11/04/2021 4.Followed by antiestrogen therapy -------------------------------------------------------------------------------------------------------------------------------- Return to clinic at the end of radiation

## 2021-10-02 ENCOUNTER — Ambulatory Visit: Payer: Managed Care, Other (non HMO)

## 2021-10-03 ENCOUNTER — Other Ambulatory Visit: Payer: Self-pay

## 2021-10-03 ENCOUNTER — Ambulatory Visit
Admission: RE | Admit: 2021-10-03 | Discharge: 2021-10-03 | Disposition: A | Discharge status: Home / Self Care | Payer: Managed Care, Other (non HMO) | Source: Ambulatory Visit | Attending: Radiation Oncology | Admitting: Radiation Oncology

## 2021-10-03 DIAGNOSIS — Z510 Encounter for antineoplastic radiation therapy: Secondary | ICD-10-CM | POA: Diagnosis not present

## 2021-10-03 LAB — RAD ONC ARIA SESSION SUMMARY
Course Elapsed Days: 3
Plan Fractions Treated to Date: 3
Plan Fractions Treated to Date: 3
Plan Prescribed Dose Per Fraction: 2 Gy
Plan Prescribed Dose Per Fraction: 2 Gy
Plan Total Fractions Prescribed: 25
Plan Total Fractions Prescribed: 25
Plan Total Prescribed Dose: 50 Gy
Plan Total Prescribed Dose: 50 Gy
Reference Point Dosage Given to Date: 6 Gy
Reference Point Dosage Given to Date: 6 Gy
Reference Point Session Dosage Given: 2 Gy
Reference Point Session Dosage Given: 2 Gy
Session Number: 3

## 2021-10-04 ENCOUNTER — Other Ambulatory Visit: Payer: Self-pay

## 2021-10-04 ENCOUNTER — Ambulatory Visit
Admission: RE | Admit: 2021-10-04 | Discharge: 2021-10-04 | Disposition: A | Discharge status: Home / Self Care | Payer: Managed Care, Other (non HMO) | Source: Ambulatory Visit | Attending: Radiation Oncology | Admitting: Radiation Oncology

## 2021-10-04 DIAGNOSIS — Z510 Encounter for antineoplastic radiation therapy: Secondary | ICD-10-CM | POA: Diagnosis not present

## 2021-10-04 LAB — RAD ONC ARIA SESSION SUMMARY
Course Elapsed Days: 4
Plan Fractions Treated to Date: 4
Plan Fractions Treated to Date: 4
Plan Prescribed Dose Per Fraction: 2 Gy
Plan Prescribed Dose Per Fraction: 2 Gy
Plan Total Fractions Prescribed: 25
Plan Total Fractions Prescribed: 25
Plan Total Prescribed Dose: 50 Gy
Plan Total Prescribed Dose: 50 Gy
Reference Point Dosage Given to Date: 8 Gy
Reference Point Dosage Given to Date: 8 Gy
Reference Point Session Dosage Given: 2 Gy
Reference Point Session Dosage Given: 2 Gy
Session Number: 4

## 2021-10-07 ENCOUNTER — Ambulatory Visit: Payer: Managed Care, Other (non HMO)

## 2021-10-08 ENCOUNTER — Other Ambulatory Visit: Payer: Self-pay

## 2021-10-08 ENCOUNTER — Ambulatory Visit
Admission: RE | Admit: 2021-10-08 | Discharge: 2021-10-08 | Disposition: A | Discharge status: Home / Self Care | Payer: Managed Care, Other (non HMO) | Source: Ambulatory Visit | Attending: Radiation Oncology | Admitting: Radiation Oncology

## 2021-10-08 DIAGNOSIS — Z510 Encounter for antineoplastic radiation therapy: Secondary | ICD-10-CM | POA: Diagnosis not present

## 2021-10-08 LAB — RAD ONC ARIA SESSION SUMMARY
Course Elapsed Days: 8
Plan Fractions Treated to Date: 5
Plan Fractions Treated to Date: 5
Plan Prescribed Dose Per Fraction: 2 Gy
Plan Prescribed Dose Per Fraction: 2 Gy
Plan Total Fractions Prescribed: 25
Plan Total Fractions Prescribed: 25
Plan Total Prescribed Dose: 50 Gy
Plan Total Prescribed Dose: 50 Gy
Reference Point Dosage Given to Date: 10 Gy
Reference Point Dosage Given to Date: 10 Gy
Reference Point Session Dosage Given: 2 Gy
Reference Point Session Dosage Given: 2 Gy
Session Number: 5

## 2021-10-09 ENCOUNTER — Inpatient Hospital Stay (HOSPITAL_BASED_OUTPATIENT_CLINIC_OR_DEPARTMENT_OTHER): Payer: Managed Care, Other (non HMO) | Admitting: Hematology and Oncology

## 2021-10-09 ENCOUNTER — Other Ambulatory Visit: Payer: Self-pay

## 2021-10-09 ENCOUNTER — Ambulatory Visit
Admission: RE | Admit: 2021-10-09 | Discharge: 2021-10-09 | Disposition: A | Discharge status: Home / Self Care | Payer: Managed Care, Other (non HMO) | Source: Ambulatory Visit | Attending: Radiation Oncology | Admitting: Radiation Oncology

## 2021-10-09 DIAGNOSIS — Z170 Estrogen receptor positive status [ER+]: Secondary | ICD-10-CM

## 2021-10-09 DIAGNOSIS — Z510 Encounter for antineoplastic radiation therapy: Secondary | ICD-10-CM | POA: Diagnosis not present

## 2021-10-09 DIAGNOSIS — C50412 Malignant neoplasm of upper-outer quadrant of left female breast: Secondary | ICD-10-CM | POA: Diagnosis not present

## 2021-10-09 LAB — RAD ONC ARIA SESSION SUMMARY
Course Elapsed Days: 9
Plan Fractions Treated to Date: 6
Plan Fractions Treated to Date: 6
Plan Prescribed Dose Per Fraction: 2 Gy
Plan Prescribed Dose Per Fraction: 2 Gy
Plan Total Fractions Prescribed: 25
Plan Total Fractions Prescribed: 25
Plan Total Prescribed Dose: 50 Gy
Plan Total Prescribed Dose: 50 Gy
Reference Point Dosage Given to Date: 12 Gy
Reference Point Dosage Given to Date: 12 Gy
Reference Point Session Dosage Given: 2 Gy
Reference Point Session Dosage Given: 2 Gy
Session Number: 6

## 2021-10-09 MED ORDER — PROCHLORPERAZINE MALEATE 10 MG PO TABS: ORAL_TABLET | ORAL | 2 refills | Status: DC

## 2021-10-09 MED ORDER — OXYCODONE HCL 5 MG PO TABS: 5.0000 mg | ORAL_TABLET | Freq: Four times a day (QID) | ORAL | 0 refills | Status: DC | PRN

## 2021-10-09 NOTE — Progress Notes (Signed)
Patient Care Team: [name], NP as PCP - General (Adult Health Nurse Practitioner) Coralie Keens, MD as Consulting Physician (General Surgery) Eppie Gibson, MD as Attending Physician (Radiation Oncology) Rockwell Germany, RN as Oncology Nurse Navigator Mauro Kaufmann, RN as Oncology Nurse Navigator Causey, Charlestine Massed, NP as Nurse Practitioner (Hematology and Oncology) Nicholas Lose, MD as Consulting Physician (Hematology and Oncology)  DIAGNOSIS:  Encounter Diagnosis  Name Primary?   Malignant neoplasm of upper-outer quadrant of left breast in female, estrogen receptor positive ([place])     SUMMARY OF ONCOLOGIC HISTORY: Oncology History  Malignant neoplasm of upper-outer quadrant of left breast in female, estrogen receptor positive ([place])  02/11/2021 Initial Diagnosis   left breast upper outer quadrant biopsy 02/07/2021 for a clinical T2 Ni, stage IIIA invasive ductal carcinoma, grade 3, ER 70% weak, PR 0% and HER2 negative, Ki-67: 40%   02/13/2021 Cancer Staging   Staging form: Breast, AJCC 8th Edition - Clinical stage from 02/13/2021: Stage IIIA (cT2, cN1, cM0, G3, ER+, PR-, HER2-) - Signed by [name], MD on 02/13/2021 Stage prefix: Initial diagnosis Histologic grading system: 3 grade system Laterality: Left Staged by: Pathologist and managing physician Stage used in treatment planning: Yes National guidelines used in treatment planning: Yes Type of national guideline used in treatment planning: NCCN    02/19/2021 Genetic Testing   [facility] [employer] (67 genes) 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 The report date is 02/26/2021.   The CustomNext-Cancer+RNAinsight panel offered by [facility] includes sequencing and rearrangement analysis for the following 47 genes:  APC, ATM, AXIN2, BARD1, BMPR1A,  BRCA1, BRCA2, BRIP1, CDH1, CDK4, CDKN2A, CHEK2, DICER1, EPCAM, GREM1, HOXB13, MEN1, MLH1, MSH2, MSH3, MSH6, MUTYH, NBN, NF1, NF2, NTHL1, PALB2, PMS2, POLD1, POLE, PTEN, RAD51C, RAD51D, RECQL, RET, SDHA, SDHAF2, SDHB, SDHC, SDHD, SMAD4, SMARCA4, STK11, TP53, TSC1, TSC2, and VHL.  RNA data is routinely analyzed for use in variant interpretation for all genes.   03/01/2021 -  Neo-Adjuvant Chemotherapy   neoadjuvant chemotherapy will consist of paclitaxel weekly x12 followed by dose dense doxorubicin and cyclophosphamide x4   08/21/2021 Surgery   Left lumpectomy: Negative for residual cancer.  Margins negative, 0/14 lymph nodes negative     CHIEF COMPLIANT: Follow up on neuropathy.  INTERVAL HISTORY: Catherine Ellison is a 46 y.o. with the above mention neuropathy. She presents to the clinic today for a follow-up  States that she is having sharp pain in her back.  She states that the pain shoots and radiate down her back. She states that it only last for a few seconds and then goes away. She states that her feet hurts so bad mainly on the top of her feet. She states that she does have some nausea. She states that her fingers is not bad just tingle a little bit. States that her feet is so bad it keeps her from sleeping at night.  ALLERGIES:  is allergic to latex.  MEDICATIONS:  Current Outpatient Medications  Medication Sig Dispense Refill   acetaminophen (TYLENOL) 325 MG tablet Take 650 mg by mouth every 6 (six) hours as needed.     dicyclomine (BENTYL) 20 MG tablet Take 1 tablet (20 mg total) by mouth every 6 (six) hours. 30 tablet 3   DULoxetine (CYMBALTA) 20 MG capsule Take 2 capsules (40 mg total) by mouth daily. 30 capsule  6   famotidine (PEPCID) 20 MG tablet Take 1 tablet (20 mg total) by mouth 2 (two) times daily. 60 tablet 5   gabapentin (NEURONTIN) 400 MG capsule Take 1 capsule (400 mg total) by mouth 3 (three) times daily. 180 capsule 3   ibuprofen (ADVIL) 800 MG tablet Take 800 mg by  mouth.     linaclotide (LINZESS) 72 MCG capsule Take 1 capsule (72 mcg total) by mouth daily. linzess 90 capsule 3   LORazepam (ATIVAN) 0.5 MG tablet Take 1 tablet (0.5 mg total) by mouth every 6 (six) hours as needed (Nausea or vomiting). 30 tablet 0   Melatonin 3 MG TABS Take 1 tablet (3 mg total) by mouth at bedtime. (Patient taking differently: Take 3 mg by mouth at bedtime as needed.) 60 tablet 0   ondansetron (ZOFRAN) 8 MG tablet Take 1 tablet (8 mg total) by mouth 2 (two) times daily as needed (Nausea or vomiting). 30 tablet 1   oxyCODONE (OXY IR/ROXICODONE) 5 MG immediate release tablet Take 1 tablet (5 mg total) by mouth every 6 (six) hours as needed for severe pain or moderate pain. 30 tablet 0   pantoprazole (PROTONIX) 40 MG tablet Take 1 tablet (40 mg total) by mouth daily. 90 tablet 3   prochlorperazine (COMPAZINE) 10 MG tablet TAKE 1 TABLET (10MG) BY MOUTH EVERY 6 HOURS AS NEEDED FOR NAUSEA OR VOMITING 30 tablet 2   venlafaxine XR (EFFEXOR-XR) 150 MG 24 hr capsule Take 1 capsule (150 mg total) by mouth daily with breakfast. Take 1 tablet daily 90 capsule 3   No current facility-administered medications for this visit.    PHYSICAL EXAMINATION: ECOG PERFORMANCE STATUS: 1 - Symptomatic but completely ambulatory  Vitals:   10/09/21 0836  BP: 130/88  Pulse: 86  Resp: 18  Temp: 97.6 F (36.4 C)  SpO2: 100%   Filed Weights   10/09/21 0836  Weight: 197 lb 14.4 oz (89.8 kg)      LABORATORY DATA:  I have reviewed the data as listed    Latest Ref Rng & Units 07/11/2021   11:09 AM 06/27/2021   10:11 AM 06/13/2021   10:04 AM  CMP  Glucose 70 - 99 mg/dL 100   119   98    BUN 6 - 20 mg/dL '11   12   16    ' Creatinine 0.44 - 1.00 mg/dL 0.88   0.78   0.99    Sodium 135 - 145 mmol/L 138   137   136    Potassium 3.5 - 5.1 mmol/L 3.9   4.0   4.3    Chloride 98 - 111 mmol/L 104   105   103    CO2 22 - 32 mmol/L '28   25   29    ' Calcium 8.9 - 10.3 mg/dL 9.2   9.2   9.1    Total  Protein 6.5 - 8.1 g/dL 6.9   6.8   6.8    Total Bilirubin 0.3 - 1.2 mg/dL 0.3   0.3   0.3    Alkaline Phos 38 - 126 U/L 105   124   108    AST 15 - 41 U/L '23   16   20    ' ALT 0 - 44 U/L '23   18   27      ' Lab Results  Component Value Date   WBC 10.4 07/11/2021   HGB 9.0 (L) 07/11/2021   HCT 29.0 (L) 07/11/2021  MCV 86.3 07/11/2021   PLT 374 07/11/2021   NEUTROABS 7.4 07/11/2021    ASSESSMENT & PLAN:  Malignant neoplasm of upper-outer quadrant of left breast in female, estrogen receptor positive ([place]) left breast upper outer quadrant biopsy 02/07/2021 for a clinical T2 Ni, stage IIIA invasive ductal carcinoma, grade 3, 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  Neoadjuvant chemotherapy with weekly Taxol x12 followed by dose dense Adriamycin and Cytoxan  2. 08/21/2021:Left lumpectomy: Negative for residual cancer.  Margins negative, 0/14 lymph nodes negative 3.  Followed by radiation 4.  Followed by antiestrogen therapy -------------------------------------------------------------------------------------------------------------------------------- Neuropathy counseling: Patient is currently on 400 mg of gabapentin 3 times a day and 40 mg of Cymbalta twice a day and still having a lot of discomfort and pain on her foot especially around the toes. I sent a new prescription for oxycodone for severe pain relief.  Our goal is to keep the amount of oxycodone usage as low as possible.  We instructed her to use compression stockings and consider taking PEA with Luteolin and also CBD oil   Breast cancer surveillance: 1.  Mammograms will need to be done in September 2023  She will come back in June after radiation is complete for me to assess her neuropathy again.   No orders of the defined types were placed in this encounter.  The patient has a good understanding of the overall plan. she agrees with it. she will call with any problems that may develop  before the next visit here. Total time spent: 30 mins including face to face time and time spent for planning, charting and co-ordination of care   Harriette Ohara, MD 10/09/21    I Gardiner Coins am scribing for Dr. Lindi Adie  I have reviewed the above documentation for accuracy and completeness, and I agree with the above.

## 2021-10-09 NOTE — Assessment & Plan Note (Addendum)
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 Margins negative, 0/14 lymph nodes negative 3.Followed by radiation 4.Followed by antiestrogen therapy -------------------------------------------------------------------------------------------------------------------------------- Neuropathy counseling: Patient is currently on 400 mg of gabapentin 3 times a day and 40 mg of Cymbalta twice a day and still having a lot of discomfort and pain on her foot especially around the toes. I sent a new prescription for oxycodone for severe pain relief.  Our goal is to keep the amount of oxycodone usage as low as possible.  We instructed her to use compression stockings and consider taking PEA with Luteolin and also CBD oil   Breast cancer surveillance: 1.  Mammograms will need to be done in September 2023  She will come back in June after radiation is complete for me to assess her neuropathy again.

## 2021-10-10 ENCOUNTER — Ambulatory Visit
Admission: RE | Admit: 2021-10-10 | Discharge: 2021-10-10 | Disposition: A | Discharge status: Home / Self Care | Payer: Managed Care, Other (non HMO) | Source: Ambulatory Visit | Attending: Radiation Oncology | Admitting: Radiation Oncology

## 2021-10-10 ENCOUNTER — Other Ambulatory Visit: Payer: Self-pay

## 2021-10-10 ENCOUNTER — Telehealth: Payer: Self-pay | Admitting: Hematology and Oncology

## 2021-10-10 DIAGNOSIS — Z510 Encounter for antineoplastic radiation therapy: Secondary | ICD-10-CM | POA: Diagnosis not present

## 2021-10-10 LAB — RAD ONC ARIA SESSION SUMMARY
Course Elapsed Days: 10
Plan Fractions Treated to Date: 7
Plan Fractions Treated to Date: 7
Plan Prescribed Dose Per Fraction: 2 Gy
Plan Prescribed Dose Per Fraction: 2 Gy
Plan Total Fractions Prescribed: 25
Plan Total Fractions Prescribed: 25
Plan Total Prescribed Dose: 50 Gy
Plan Total Prescribed Dose: 50 Gy
Reference Point Dosage Given to Date: 14 Gy
Reference Point Dosage Given to Date: 14 Gy
Reference Point Session Dosage Given: 2 Gy
Reference Point Session Dosage Given: 2 Gy
Session Number: 7

## 2021-10-10 NOTE — Telephone Encounter (Signed)
Scheduled appointment per 5/24 los. Left message.

## 2021-10-11 ENCOUNTER — Other Ambulatory Visit: Payer: Self-pay

## 2021-10-11 ENCOUNTER — Ambulatory Visit
Admission: RE | Admit: 2021-10-11 | Discharge: 2021-10-11 | Disposition: A | Discharge status: Home / Self Care | Payer: Managed Care, Other (non HMO) | Source: Ambulatory Visit | Attending: Radiation Oncology | Admitting: Radiation Oncology

## 2021-10-11 DIAGNOSIS — Z510 Encounter for antineoplastic radiation therapy: Secondary | ICD-10-CM | POA: Diagnosis not present

## 2021-10-11 LAB — RAD ONC ARIA SESSION SUMMARY
Course Elapsed Days: 11
Plan Fractions Treated to Date: 8
Plan Fractions Treated to Date: 8
Plan Prescribed Dose Per Fraction: 2 Gy
Plan Prescribed Dose Per Fraction: 2 Gy
Plan Total Fractions Prescribed: 25
Plan Total Fractions Prescribed: 25
Plan Total Prescribed Dose: 50 Gy
Plan Total Prescribed Dose: 50 Gy
Reference Point Dosage Given to Date: 16 Gy
Reference Point Dosage Given to Date: 16 Gy
Reference Point Session Dosage Given: 2 Gy
Reference Point Session Dosage Given: 2 Gy
Session Number: 8

## 2021-10-15 ENCOUNTER — Other Ambulatory Visit: Payer: Self-pay

## 2021-10-15 ENCOUNTER — Ambulatory Visit
Admission: RE | Admit: 2021-10-15 | Discharge: 2021-10-15 | Disposition: A | Discharge status: Home / Self Care | Payer: Managed Care, Other (non HMO) | Source: Ambulatory Visit | Attending: Radiation Oncology | Admitting: Radiation Oncology

## 2021-10-15 ENCOUNTER — Ambulatory Visit: Payer: Managed Care, Other (non HMO) | Attending: Radiation Oncology

## 2021-10-15 DIAGNOSIS — C50412 Malignant neoplasm of upper-outer quadrant of left female breast: Secondary | ICD-10-CM | POA: Insufficient documentation

## 2021-10-15 DIAGNOSIS — R591 Generalized enlarged lymph nodes: Secondary | ICD-10-CM | POA: Insufficient documentation

## 2021-10-15 DIAGNOSIS — Z510 Encounter for antineoplastic radiation therapy: Secondary | ICD-10-CM | POA: Diagnosis not present

## 2021-10-15 DIAGNOSIS — Z170 Estrogen receptor positive status [ER+]: Secondary | ICD-10-CM | POA: Insufficient documentation

## 2021-10-15 LAB — RAD ONC ARIA SESSION SUMMARY
Course Elapsed Days: 15
Plan Fractions Treated to Date: 9
Plan Fractions Treated to Date: 9
Plan Prescribed Dose Per Fraction: 2 Gy
Plan Prescribed Dose Per Fraction: 2 Gy
Plan Total Fractions Prescribed: 25
Plan Total Fractions Prescribed: 25
Plan Total Prescribed Dose: 50 Gy
Plan Total Prescribed Dose: 50 Gy
Reference Point Dosage Given to Date: 18 Gy
Reference Point Dosage Given to Date: 18 Gy
Reference Point Session Dosage Given: 2 Gy
Reference Point Session Dosage Given: 2 Gy
Session Number: 9

## 2021-10-16 ENCOUNTER — Other Ambulatory Visit: Payer: Self-pay

## 2021-10-16 ENCOUNTER — Ambulatory Visit
Admission: RE | Admit: 2021-10-16 | Discharge: 2021-10-16 | Disposition: A | Discharge status: Home / Self Care | Payer: Managed Care, Other (non HMO) | Source: Ambulatory Visit | Attending: Radiation Oncology | Admitting: Radiation Oncology

## 2021-10-16 DIAGNOSIS — Z510 Encounter for antineoplastic radiation therapy: Secondary | ICD-10-CM | POA: Diagnosis not present

## 2021-10-16 LAB — RAD ONC ARIA SESSION SUMMARY
Course Elapsed Days: 16
Plan Fractions Treated to Date: 10
Plan Fractions Treated to Date: 10
Plan Prescribed Dose Per Fraction: 2 Gy
Plan Prescribed Dose Per Fraction: 2 Gy
Plan Total Fractions Prescribed: 25
Plan Total Fractions Prescribed: 25
Plan Total Prescribed Dose: 50 Gy
Plan Total Prescribed Dose: 50 Gy
Reference Point Dosage Given to Date: 20 Gy
Reference Point Dosage Given to Date: 20 Gy
Reference Point Session Dosage Given: 2 Gy
Reference Point Session Dosage Given: 2 Gy
Session Number: 10

## 2021-10-17 ENCOUNTER — Ambulatory Visit
Admission: RE | Admit: 2021-10-17 | Discharge: 2021-10-17 | Disposition: A | Discharge status: Home / Self Care | Payer: Managed Care, Other (non HMO) | Source: Ambulatory Visit | Attending: Radiation Oncology | Admitting: Radiation Oncology

## 2021-10-17 ENCOUNTER — Other Ambulatory Visit: Payer: Self-pay

## 2021-10-17 DIAGNOSIS — Z79899 Other long term (current) drug therapy: Secondary | ICD-10-CM | POA: Diagnosis not present

## 2021-10-17 DIAGNOSIS — C50412 Malignant neoplasm of upper-outer quadrant of left female breast: Secondary | ICD-10-CM | POA: Insufficient documentation

## 2021-10-17 DIAGNOSIS — Z170 Estrogen receptor positive status [ER+]: Secondary | ICD-10-CM | POA: Insufficient documentation

## 2021-10-17 DIAGNOSIS — Z510 Encounter for antineoplastic radiation therapy: Secondary | ICD-10-CM | POA: Diagnosis present

## 2021-10-17 DIAGNOSIS — G629 Polyneuropathy, unspecified: Secondary | ICD-10-CM | POA: Diagnosis not present

## 2021-10-17 LAB — RAD ONC ARIA SESSION SUMMARY
Course Elapsed Days: 17
Plan Fractions Treated to Date: 11
Plan Fractions Treated to Date: 11
Plan Prescribed Dose Per Fraction: 2 Gy
Plan Prescribed Dose Per Fraction: 2 Gy
Plan Total Fractions Prescribed: 25
Plan Total Fractions Prescribed: 25
Plan Total Prescribed Dose: 50 Gy
Plan Total Prescribed Dose: 50 Gy
Reference Point Dosage Given to Date: 22 Gy
Reference Point Dosage Given to Date: 22 Gy
Reference Point Session Dosage Given: 2 Gy
Reference Point Session Dosage Given: 2 Gy
Session Number: 11

## 2021-10-18 ENCOUNTER — Other Ambulatory Visit: Payer: Self-pay

## 2021-10-18 ENCOUNTER — Ambulatory Visit: Payer: Managed Care, Other (non HMO)

## 2021-10-18 ENCOUNTER — Ambulatory Visit
Admission: RE | Admit: 2021-10-18 | Discharge: 2021-10-18 | Disposition: A | Discharge status: Home / Self Care | Payer: Managed Care, Other (non HMO) | Source: Ambulatory Visit | Attending: Radiation Oncology | Admitting: Radiation Oncology

## 2021-10-18 DIAGNOSIS — Z510 Encounter for antineoplastic radiation therapy: Secondary | ICD-10-CM | POA: Diagnosis not present

## 2021-10-18 LAB — RAD ONC ARIA SESSION SUMMARY
Course Elapsed Days: 18
Plan Fractions Treated to Date: 12
Plan Fractions Treated to Date: 12
Plan Prescribed Dose Per Fraction: 2 Gy
Plan Prescribed Dose Per Fraction: 2 Gy
Plan Total Fractions Prescribed: 25
Plan Total Fractions Prescribed: 25
Plan Total Prescribed Dose: 50 Gy
Plan Total Prescribed Dose: 50 Gy
Reference Point Dosage Given to Date: 24 Gy
Reference Point Dosage Given to Date: 24 Gy
Reference Point Session Dosage Given: 2 Gy
Reference Point Session Dosage Given: 2 Gy
Session Number: 12

## 2021-10-21 ENCOUNTER — Ambulatory Visit: Payer: Managed Care, Other (non HMO)

## 2021-10-21 ENCOUNTER — Other Ambulatory Visit: Payer: Self-pay

## 2021-10-21 ENCOUNTER — Ambulatory Visit
Admission: RE | Admit: 2021-10-21 | Discharge: 2021-10-21 | Disposition: A | Discharge status: Home / Self Care | Payer: Managed Care, Other (non HMO) | Source: Ambulatory Visit | Attending: Radiation Oncology | Admitting: Radiation Oncology

## 2021-10-21 DIAGNOSIS — Z510 Encounter for antineoplastic radiation therapy: Secondary | ICD-10-CM | POA: Diagnosis not present

## 2021-10-21 LAB — RAD ONC ARIA SESSION SUMMARY
Course Elapsed Days: 21
Plan Fractions Treated to Date: 13
Plan Fractions Treated to Date: 13
Plan Prescribed Dose Per Fraction: 2 Gy
Plan Prescribed Dose Per Fraction: 2 Gy
Plan Total Fractions Prescribed: 25
Plan Total Fractions Prescribed: 25
Plan Total Prescribed Dose: 50 Gy
Plan Total Prescribed Dose: 50 Gy
Reference Point Dosage Given to Date: 26 Gy
Reference Point Dosage Given to Date: 26 Gy
Reference Point Session Dosage Given: 2 Gy
Reference Point Session Dosage Given: 2 Gy
Session Number: 13

## 2021-10-22 ENCOUNTER — Ambulatory Visit: Payer: Managed Care, Other (non HMO)

## 2021-10-22 ENCOUNTER — Ambulatory Visit
Admission: RE | Admit: 2021-10-22 | Discharge: 2021-10-22 | Disposition: A | Discharge status: Home / Self Care | Payer: Managed Care, Other (non HMO) | Source: Ambulatory Visit | Attending: Radiation Oncology | Admitting: Radiation Oncology

## 2021-10-22 ENCOUNTER — Other Ambulatory Visit: Payer: Self-pay

## 2021-10-22 DIAGNOSIS — Z510 Encounter for antineoplastic radiation therapy: Secondary | ICD-10-CM | POA: Diagnosis not present

## 2021-10-22 LAB — RAD ONC ARIA SESSION SUMMARY
Course Elapsed Days: 22
Plan Fractions Treated to Date: 14
Plan Fractions Treated to Date: 14
Plan Prescribed Dose Per Fraction: 2 Gy
Plan Prescribed Dose Per Fraction: 2 Gy
Plan Total Fractions Prescribed: 25
Plan Total Fractions Prescribed: 25
Plan Total Prescribed Dose: 50 Gy
Plan Total Prescribed Dose: 50 Gy
Reference Point Dosage Given to Date: 28 Gy
Reference Point Dosage Given to Date: 28 Gy
Reference Point Session Dosage Given: 2 Gy
Reference Point Session Dosage Given: 2 Gy
Session Number: 14

## 2021-10-23 ENCOUNTER — Other Ambulatory Visit: Payer: Self-pay

## 2021-10-23 ENCOUNTER — Ambulatory Visit
Admission: RE | Admit: 2021-10-23 | Discharge: 2021-10-23 | Disposition: A | Discharge status: Home / Self Care | Payer: Managed Care, Other (non HMO) | Source: Ambulatory Visit | Attending: Radiation Oncology | Admitting: Radiation Oncology

## 2021-10-23 DIAGNOSIS — Z510 Encounter for antineoplastic radiation therapy: Secondary | ICD-10-CM | POA: Diagnosis not present

## 2021-10-23 LAB — RAD ONC ARIA SESSION SUMMARY
Course Elapsed Days: 23
Plan Fractions Treated to Date: 15
Plan Fractions Treated to Date: 15
Plan Prescribed Dose Per Fraction: 2 Gy
Plan Prescribed Dose Per Fraction: 2 Gy
Plan Total Fractions Prescribed: 25
Plan Total Fractions Prescribed: 25
Plan Total Prescribed Dose: 50 Gy
Plan Total Prescribed Dose: 50 Gy
Reference Point Dosage Given to Date: 30 Gy
Reference Point Dosage Given to Date: 30 Gy
Reference Point Session Dosage Given: 2 Gy
Reference Point Session Dosage Given: 2 Gy
Session Number: 15

## 2021-10-24 ENCOUNTER — Other Ambulatory Visit: Payer: Self-pay

## 2021-10-24 ENCOUNTER — Ambulatory Visit
Admission: RE | Admit: 2021-10-24 | Discharge: 2021-10-24 | Disposition: A | Discharge status: Home / Self Care | Payer: Managed Care, Other (non HMO) | Source: Ambulatory Visit | Attending: Radiation Oncology | Admitting: Radiation Oncology

## 2021-10-24 DIAGNOSIS — Z510 Encounter for antineoplastic radiation therapy: Secondary | ICD-10-CM | POA: Diagnosis not present

## 2021-10-24 LAB — RAD ONC ARIA SESSION SUMMARY
Course Elapsed Days: 24
Plan Fractions Treated to Date: 16
Plan Fractions Treated to Date: 16
Plan Prescribed Dose Per Fraction: 2 Gy
Plan Prescribed Dose Per Fraction: 2 Gy
Plan Total Fractions Prescribed: 25
Plan Total Fractions Prescribed: 25
Plan Total Prescribed Dose: 50 Gy
Plan Total Prescribed Dose: 50 Gy
Reference Point Dosage Given to Date: 32 Gy
Reference Point Dosage Given to Date: 32 Gy
Reference Point Session Dosage Given: 2 Gy
Reference Point Session Dosage Given: 2 Gy
Session Number: 16

## 2021-10-25 ENCOUNTER — Other Ambulatory Visit: Payer: Self-pay

## 2021-10-25 ENCOUNTER — Ambulatory Visit
Admission: RE | Admit: 2021-10-25 | Discharge: 2021-10-25 | Disposition: A | Discharge status: Home / Self Care | Payer: Managed Care, Other (non HMO) | Source: Ambulatory Visit | Attending: Radiation Oncology | Admitting: Radiation Oncology

## 2021-10-25 DIAGNOSIS — Z510 Encounter for antineoplastic radiation therapy: Secondary | ICD-10-CM | POA: Diagnosis not present

## 2021-10-25 LAB — RAD ONC ARIA SESSION SUMMARY
Course Elapsed Days: 25
Plan Fractions Treated to Date: 17
Plan Fractions Treated to Date: 17
Plan Prescribed Dose Per Fraction: 2 Gy
Plan Prescribed Dose Per Fraction: 2 Gy
Plan Total Fractions Prescribed: 25
Plan Total Fractions Prescribed: 25
Plan Total Prescribed Dose: 50 Gy
Plan Total Prescribed Dose: 50 Gy
Reference Point Dosage Given to Date: 34 Gy
Reference Point Dosage Given to Date: 34 Gy
Reference Point Session Dosage Given: 2 Gy
Reference Point Session Dosage Given: 2 Gy
Session Number: 17

## 2021-10-28 ENCOUNTER — Ambulatory Visit: Payer: Managed Care, Other (non HMO)

## 2021-10-28 ENCOUNTER — Ambulatory Visit
Admission: RE | Admit: 2021-10-28 | Discharge: 2021-10-28 | Disposition: A | Discharge status: Home / Self Care | Payer: Managed Care, Other (non HMO) | Source: Ambulatory Visit | Attending: Radiation Oncology | Admitting: Radiation Oncology

## 2021-10-28 ENCOUNTER — Other Ambulatory Visit: Payer: Self-pay

## 2021-10-28 DIAGNOSIS — Z510 Encounter for antineoplastic radiation therapy: Secondary | ICD-10-CM | POA: Diagnosis not present

## 2021-10-28 LAB — RAD ONC ARIA SESSION SUMMARY
Course Elapsed Days: 28
Plan Fractions Treated to Date: 18
Plan Fractions Treated to Date: 18
Plan Prescribed Dose Per Fraction: 2 Gy
Plan Prescribed Dose Per Fraction: 2 Gy
Plan Total Fractions Prescribed: 25
Plan Total Fractions Prescribed: 25
Plan Total Prescribed Dose: 50 Gy
Plan Total Prescribed Dose: 50 Gy
Reference Point Dosage Given to Date: 36 Gy
Reference Point Dosage Given to Date: 36 Gy
Reference Point Session Dosage Given: 2 Gy
Reference Point Session Dosage Given: 2 Gy
Session Number: 18

## 2021-10-29 ENCOUNTER — Ambulatory Visit
Admission: RE | Admit: 2021-10-29 | Discharge: 2021-10-29 | Disposition: A | Discharge status: Home / Self Care | Payer: Managed Care, Other (non HMO) | Source: Ambulatory Visit | Attending: Radiation Oncology | Admitting: Radiation Oncology

## 2021-10-29 ENCOUNTER — Other Ambulatory Visit: Payer: Self-pay

## 2021-10-29 DIAGNOSIS — Z510 Encounter for antineoplastic radiation therapy: Secondary | ICD-10-CM | POA: Diagnosis not present

## 2021-10-29 LAB — RAD ONC ARIA SESSION SUMMARY
Course Elapsed Days: 29
Plan Fractions Treated to Date: 19
Plan Fractions Treated to Date: 19
Plan Prescribed Dose Per Fraction: 2 Gy
Plan Prescribed Dose Per Fraction: 2 Gy
Plan Total Fractions Prescribed: 25
Plan Total Fractions Prescribed: 25
Plan Total Prescribed Dose: 50 Gy
Plan Total Prescribed Dose: 50 Gy
Reference Point Dosage Given to Date: 38 Gy
Reference Point Dosage Given to Date: 38 Gy
Reference Point Session Dosage Given: 2 Gy
Reference Point Session Dosage Given: 2 Gy
Session Number: 19

## 2021-10-30 ENCOUNTER — Other Ambulatory Visit: Payer: Self-pay

## 2021-10-30 ENCOUNTER — Ambulatory Visit
Admission: RE | Admit: 2021-10-30 | Discharge: 2021-10-30 | Disposition: A | Discharge status: Home / Self Care | Payer: Managed Care, Other (non HMO) | Source: Ambulatory Visit | Attending: Radiation Oncology | Admitting: Radiation Oncology

## 2021-10-30 ENCOUNTER — Telehealth: Payer: Managed Care, Other (non HMO) | Admitting: Hematology and Oncology

## 2021-10-30 ENCOUNTER — Encounter: Payer: Self-pay | Admitting: Adult Health

## 2021-10-30 ENCOUNTER — Inpatient Hospital Stay: Payer: Managed Care, Other (non HMO)

## 2021-10-30 ENCOUNTER — Inpatient Hospital Stay: Payer: Managed Care, Other (non HMO) | Attending: Oncology | Admitting: Adult Health

## 2021-10-30 ENCOUNTER — Telehealth: Payer: Self-pay

## 2021-10-30 VITALS — BP 130/95 | HR 83 | Temp 97.9°F | Resp 17 | Ht 71.0 in | Wt 192.5 lb

## 2021-10-30 DIAGNOSIS — C50412 Malignant neoplasm of upper-outer quadrant of left female breast: Secondary | ICD-10-CM | POA: Insufficient documentation

## 2021-10-30 DIAGNOSIS — R1011 Right upper quadrant pain: Secondary | ICD-10-CM | POA: Diagnosis not present

## 2021-10-30 DIAGNOSIS — Z170 Estrogen receptor positive status [ER+]: Secondary | ICD-10-CM

## 2021-10-30 DIAGNOSIS — G629 Polyneuropathy, unspecified: Secondary | ICD-10-CM | POA: Insufficient documentation

## 2021-10-30 DIAGNOSIS — Z510 Encounter for antineoplastic radiation therapy: Secondary | ICD-10-CM | POA: Insufficient documentation

## 2021-10-30 DIAGNOSIS — Z79899 Other long term (current) drug therapy: Secondary | ICD-10-CM | POA: Insufficient documentation

## 2021-10-30 LAB — RAD ONC ARIA SESSION SUMMARY
Course Elapsed Days: 30
Plan Fractions Treated to Date: 20
Plan Fractions Treated to Date: 20
Plan Prescribed Dose Per Fraction: 2 Gy
Plan Prescribed Dose Per Fraction: 2 Gy
Plan Total Fractions Prescribed: 25
Plan Total Fractions Prescribed: 25
Plan Total Prescribed Dose: 50 Gy
Plan Total Prescribed Dose: 50 Gy
Reference Point Dosage Given to Date: 40 Gy
Reference Point Dosage Given to Date: 40 Gy
Reference Point Session Dosage Given: 2 Gy
Reference Point Session Dosage Given: 2 Gy
Session Number: 20

## 2021-10-30 LAB — CMP (CANCER CENTER ONLY)
ALT: 16 U/L (ref 0–44)
AST: 21 U/L (ref 15–41)
Albumin: 4.3 g/dL (ref 3.5–5.0)
Alkaline Phosphatase: 126 U/L (ref 38–126)
Anion gap: 8 (ref 5–15)
BUN: 12 mg/dL (ref 6–20)
CO2: 26 mmol/L (ref 22–32)
Calcium: 10.2 mg/dL (ref 8.9–10.3)
Chloride: 104 mmol/L (ref 98–111)
Creatinine: 0.87 mg/dL (ref 0.44–1.00)
GFR, Estimated: >60 mL/min (ref >60)
Glucose, Bld: 154 mg/dL — ABNORMAL HIGH (ref 70–99)
Potassium: 3.9 mmol/L (ref 3.5–5.1)
Sodium: 138 mmol/L (ref 135–145)
Total Bilirubin: 0.4 mg/dL (ref 0.3–1.2)
Total Protein: 8 g/dL (ref 6.5–8.1)

## 2021-10-30 LAB — CBC WITH DIFFERENTIAL (CANCER CENTER ONLY)
Abs Immature Granulocytes: 0.02 10*3/uL (ref 0.00–0.07)
Basophils Absolute: 0 10*3/uL (ref 0.0–0.1)
Basophils Relative: 1 %
Eosinophils Absolute: 0.1 10*3/uL (ref 0.0–0.5)
Eosinophils Relative: 2 %
HCT: 37 % (ref 36.0–46.0)
Hemoglobin: 11.5 g/dL — ABNORMAL LOW (ref 12.0–15.0)
Immature Granulocytes: 0 %
Lymphocytes Relative: 19 %
Lymphs Abs: 0.9 10*3/uL (ref 0.7–4.0)
MCH: 23.8 pg — ABNORMAL LOW (ref 26.0–34.0)
MCHC: 31.1 g/dL (ref 30.0–36.0)
MCV: 76.6 fL — ABNORMAL LOW (ref 80.0–100.0)
Monocytes Absolute: 0.4 10*3/uL (ref 0.1–1.0)
Monocytes Relative: 9 %
Neutro Abs: 3.5 10*3/uL (ref 1.7–7.7)
Neutrophils Relative %: 69 %
Platelet Count: 326 10*3/uL (ref 150–400)
RBC: 4.83 MIL/uL (ref 3.87–5.11)
RDW: 15.8 % — ABNORMAL HIGH (ref 11.5–15.5)
WBC Count: 5.1 10*3/uL (ref 4.0–10.5)
nRBC: 0 % (ref 0.0–0.2)

## 2021-10-30 LAB — VITAMIN B12: Vitamin B-12: 299 pg/mL (ref 180–914)

## 2021-10-30 LAB — IRON AND IRON BINDING CAPACITY (CC-WL,HP ONLY)
Iron: 31 ug/dL (ref 28–170)
Saturation Ratios: 5 % — ABNORMAL LOW (ref 10.4–31.8)
TIBC: 587 ug/dL — ABNORMAL HIGH (ref 250–450)
UIBC: 556 ug/dL

## 2021-10-30 LAB — FERRITIN: Ferritin: 7 ng/mL — ABNORMAL LOW (ref 11–307)

## 2021-10-30 LAB — VITAMIN D 25 HYDROXY (VIT D DEFICIENCY, FRACTURES): Vit D, 25-Hydroxy: 11.5 ng/mL — ABNORMAL LOW (ref 30–100)

## 2021-10-30 MED ORDER — ERGOCALCIFEROL 1.25 MG (50000 UT) PO CAPS: 50000.0000 [IU] | ORAL_CAPSULE | ORAL | 0 refills | Status: DC

## 2021-10-30 MED ORDER — DULOXETINE HCL 40 MG PO CPEP: 40.0000 mg | ORAL_CAPSULE | Freq: Every day | ORAL | 3 refills | Status: DC

## 2021-10-30 NOTE — Telephone Encounter (Signed)
Called pt to give appt date/time for abd [place] per NP. Pt is scheduled for 11/06/21 at 0700 with an arrival of 0645; advised pt of this. Pt states she is not sure she will be able to make this appt as she works 7P-7A shifts. She is going to speak with management and if she is not able to get off work early she will r/s [place] for earlier date. Pt is aware expected date is 6/21 and we would like these results ASAP. Pt was provided number to central scheduling and she knows to call us with any further concerns. Will f/u to make sure [place] has been complete.

## 2021-10-30 NOTE — Progress Notes (Signed)
[name]:    [name], NP No address on file   DIAGNOSIS:  Cancer Staging  Malignant neoplasm of upper-outer quadrant of left breast in female, estrogen receptor positive ([place]) Staging form: Breast, AJCC 8th Edition - Clinical stage from 02/13/2021: Stage IIIA (cT2, cN1, cM0, G3, ER+, PR-, HER2-) - Signed by [name], MD on 02/13/2021 Stage prefix: Initial diagnosis Histologic grading system: 3 grade system Laterality: Left Staged by: Pathologist and managing physician Stage used in treatment planning: Yes National guidelines used in treatment planning: Yes Type of national guideline used in treatment planning: NCCN - Pathologic stage from 08/21/2021: pT0, pN0, cM0 - Signed by [name], NP on 10/30/2021 Stage prefix: Initial diagnosis   SUMMARY OF ONCOLOGIC HISTORY: Oncology History  Malignant neoplasm of upper-outer quadrant of left breast in female, estrogen receptor positive ([place])  02/11/2021 Initial Diagnosis   left breast upper outer quadrant biopsy 02/07/2021 for a clinical T2 Ni, stage IIIA invasive ductal carcinoma, grade 3, ER 70% weak, PR 0% and HER2 negative, Ki-67: 40%   02/13/2021 Cancer Staging   Staging form: Breast, AJCC 8th Edition - Clinical stage from 02/13/2021: Stage IIIA (cT2, cN1, cM0, G3, ER+, PR-, HER2-) - Signed by [name], MD on 02/13/2021 Stage prefix: Initial diagnosis Histologic grading system: 3 grade system Laterality: Left Staged by: Pathologist and managing physician Stage used in treatment planning: Yes National guidelines used in treatment planning: Yes Type of national guideline used in treatment planning: NCCN   02/19/2021 Genetic Testing   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    Genes tested: APC, ATM, AXIN2, BARD1, BMPR1A,  BRCA1, BRCA2, BRIP1, CDH1, CDK4, CDKN2A, CHEK2, DICER1, EPCAM, GREM1, HOXB13, MEN1, MLH1, MSH2, MSH3, MSH6, MUTYH, NBN, NF1, NF2, NTHL1, PALB2, PMS2, POLD1, POLE, PTEN, RAD51C, RAD51D, RECQL, RET, SDHA, SDHAF2, SDHB, SDHC, SDHD, SMAD4, SMARCA4, STK11, TP53, TSC1, TSC2, and VHL.  RNA data is routinely analyzed for use in variant interpretation for all genes.  Recommendations:     Management Recommendations for individuals with a single MUTYH pathogenic variant:  Changes in the MUTYH gene are associated with MUTYH-associated Polyposis syndrome (MAP). MAP is a hereditary cancer condition in which patients have high risks for colorectal cancer due to a large number of adenomatous polyps in the gastrointestinal system. MAP is unique among the hereditary cancer syndromes in that it is a "recessive" condition. Most hereditary cancer conditions are "dominant", meaning the condition is present in patients with a mutation in one copy of the gene. Patients have MAP only if there are mutations in both of their copies of the MUTYH gene. 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    Colon Cancer Screening: If an individual has a first-degree relative with colorectal cancer, screening should begin at age 33 or 43 years prior to the relative's age at diagnosis, whichever comes first and repeat colonoscopies every 5 years.   If an individual has no first-degree relatives with colorectal cancer, data is unclear if specialized screening is warranted. We advise these patients to follow their gastroenterologists recommendations.   03/01/2021 -  Neo-Adjuvant Chemotherapy   neoadjuvant chemotherapy will consist of paclitaxel weekly x12 followed by dose dense doxorubicin and cyclophosphamide x4   08/21/2021 Surgery   Left  lumpectomy: Negative for residual cancer.  Margins negative, 0/14 lymph nodes negative   08/21/2021 Cancer Staging    Staging form: Breast, AJCC 8th Edition - Pathologic stage from 08/21/2021: pT0, pN0, cM0 - Signed by [name], NP on 10/30/2021 Stage prefix: Initial diagnosis   09/30/2021 - 11/07/2021 Radiation Therapy   Adjuvant radiation     CURRENT THERAPY: Adjuvant radiation  INTERVAL HISTORY: [name] 46 y.o. female returns for follow-up and evaluation today.  She is currently undergoing adjuvant radiation.  Quiara is c/o persistent peripheral neuropathy in her feet.  She says that she is having difficulty walking and standing on her feet for 12 hours at work.  She describes the pain as a burning, tingling, and intense electric pain in her feet bilaterally, and this leads to difficulty sleeping.  Her toes are the worse as well as the proximal sole of her toes and foot.  She c/o about this to Dr. Lindi Adie last month and she was started on PEA from [facility], and was recommended cbd oil and compression socks.  She is already taking gabapentin and cymbalta at $RemoveBef'20mg'FFYCSWzjVK$  daily.  She is taking Oxycodone for pain related to the neuropathy.  She tries to avoid taking the medication, however will take it when the pain becomes excruciating which occurs every day, but not multiple times a day.    Tyjai works as an Runner, broadcasting/film/video.  This job is demanding and she is required to be on her feet and walk longer distances for 12 hours at a time.  At the end of her shift she cannot walk, and has tried multiple shoes without relief.    She is c/o increased right upper abdominal discomfort that has been going on for a couple of months.    Patient Active Problem List   Diagnosis Date Noted   S/P lumpectomy, left breast 08/21/2021   Port-A-Cath in place 82/50/0370   Monoallelic mutation of MUTYH gene 03/04/2021   Genetic testing 02/20/2021   Family history of breast cancer 02/13/2021   Family history of colon cancer 02/13/2021   Malignant neoplasm of upper-outer quadrant of left breast in female,  estrogen receptor positive ([place]) 02/11/2021   Cervical cancer screening 06/30/2019   Menorrhagia with regular cycle 06/30/2019   Vaginal bleeding 06/30/2019   Adjustment disorder with mixed anxiety and depressed mood 11/06/2016    is allergic to latex.  MEDICAL HISTORY: Past Medical History:  Diagnosis Date   Allergy    seasonal    Anemia    Dyspnea    with heavy exertion   GERD (gastroesophageal reflux disease)    History of gestational diabetes 2005   Hyperemesis gravidarum    with all pregnancies   IBS (irritable bowel syndrome)    left breast cancer    Peptic ulcer    Pneumonia     SURGICAL HISTORY: Past Surgical History:  Procedure Laterality Date   BILATERAL SALPINGECTOMY Bilateral 2014   emergency   IR CV LINE INJECTION  05/29/2021   LEFT OOPHORECTOMY Left 2006   PORTACATH PLACEMENT Right 02/28/2021   Procedure: INSERTION PORT-A-CATH;  Surgeon: Coralie Keens, MD;  Location: [place];  Service: General;  Laterality: Right;   RADIOACTIVE SEED GUIDED PARTIAL MASTECTOMY/AXILLARY SENTINEL NODE BIOPSY/AXILLARY NODE DISSECTION Left 08/21/2021   Procedure: LEFT BREAST RADIOACTIVE SEED GUIDED LUMPECTOMY AND COMPLETE LEFT AXILLARY LYMPH NODE DISSECTION;  Surgeon: Coralie Keens, MD;  Location: [place];  Service: General;  Laterality: Left;  LMA  PECTORAL BLOCK    SOCIAL HISTORY: Social History   Socioeconomic History   Marital status: Divorced    Spouse name: Not on file   Number of children: 3   Years of education: Not on file   Highest education level: Not on file  Occupational History   Occupation: nurse    Comment: mental health  Tobacco Use   Smoking status: Never   Smokeless tobacco: Never  Vaping Use   Vaping Use: Never used  Substance and Sexual Activity   Alcohol use: Yes    Alcohol/week: 5.0 standard drinks of alcohol    Types: 1 Glasses of wine, 1 Cans of beer, 1 Shots of liquor, 2 Standard drinks or  equivalent per week    Comment: Occasional   Drug use: No   Sexual activity: Yes  Other Topics Concern   Not on file  Social History Narrative   Lives with her youngest child.   Social Determinants of Health   Financial Resource Strain: Medium Risk (02/13/2021)   Overall Financial Resource Strain (CARDIA)    Difficulty of Paying Living Expenses: Somewhat hard  Food Insecurity: No Food Insecurity (02/13/2021)   Hunger Vital Sign    Worried About Running Out of Food in the Last Year: Never true    Ran Out of Food in the Last Year: Never true  Transportation Needs: No Transportation Needs (02/13/2021)   PRAPARE - Hydrologist (Medical): No    Lack of Transportation (Non-Medical): No  Physical Activity: Not on file  Stress: Not on file  Social Connections: Not on file  Intimate Partner Violence: Not on file    FAMILY HISTORY: Family History  Problem Relation Age of Onset   Diabetes Mother    Hypertension Mother    Thyroid disease Mother    Diabetes Father    Heart disease Father        4V CABG 03/2016, age 35   Hyperlipidemia Father    Hypertension Father    Hypothyroidism Father    Diabetes Sister    Hypertension Sister    Hypertension Maternal Grandmother    Colon cancer Maternal Grandmother        dx. >50   Hypertension Maternal Grandfather    Hypertension Paternal Grandmother    Kidney disease Paternal Grandmother    Hypertension Paternal Grandfather    Lung cancer Paternal Grandfather    Breast cancer Maternal Aunt        dx. 40s    Review of Systems  Constitutional:  Positive for fatigue. Negative for appetite change, chills, fever and unexpected weight change.  HENT:   Negative for hearing loss, lump/mass and trouble swallowing.   Eyes:  Negative for eye problems and icterus.  Respiratory:  Negative for chest tightness, cough and shortness of breath.   Cardiovascular:  Negative for chest pain, leg swelling and palpitations.   Gastrointestinal:  Positive for abdominal pain (More of a discomfort.). Negative for abdominal distention, constipation, diarrhea, nausea and vomiting.  Endocrine: Negative for hot flashes.  Genitourinary:  Negative for difficulty urinating.   Musculoskeletal:  Positive for gait problem. Negative for arthralgias.  Skin:  Negative for itching and rash.  Neurological:  Positive for gait problem and numbness. Negative for dizziness, extremity weakness and headaches.  Hematological:  Negative for adenopathy. Does not bruise/bleed easily.  Psychiatric/Behavioral:  Negative for depression. The patient is not nervous/anxious.       PHYSICAL EXAMINATION  ECOG PERFORMANCE STATUS: 1 -  Symptomatic but completely ambulatory  Vitals:   10/30/21 0851  BP: (!) 130/95  Pulse: 83  Resp: 17  Temp: 97.9 F (36.6 C)  SpO2: 99%    Physical Exam Constitutional:      General: She is not in acute distress.    Appearance: Normal appearance. She is not toxic-appearing.  HENT:     Head: Normocephalic and atraumatic.  Eyes:     General: No scleral icterus. Cardiovascular:     Rate and Rhythm: Normal rate and regular rhythm.     Pulses: Normal pulses.     Heart sounds: Normal heart sounds.  Pulmonary:     Effort: Pulmonary effort is normal.     Breath sounds: Normal breath sounds.  Abdominal:     General: Abdomen is flat. Bowel sounds are normal. There is no distension.     Palpations: Abdomen is soft.     Tenderness: There is no abdominal tenderness.  Musculoskeletal:        General: No swelling.     Cervical back: Neck supple.  Lymphadenopathy:     Cervical: No cervical adenopathy.  Skin:    General: Skin is warm and dry.     Findings: No rash.  Neurological:     General: No focal deficit present.     Mental Status: She is alert.  Psychiatric:        Mood and Affect: Mood normal.        Behavior: Behavior normal.     LABORATORY DATA:  CBC    Component Value Date/Time   WBC  10.4 07/11/2021 1109   RBC 3.36 (L) 07/11/2021 1109   HGB 9.0 (L) 07/11/2021 1109   HGB 9.5 (L) 06/30/2019 1551   HCT 29.0 (L) 07/11/2021 1109   HCT 30.8 (L) 06/30/2019 1551   PLT 374 07/11/2021 1109   PLT 389 06/30/2019 1551   MCV 86.3 07/11/2021 1109   MCV 72 (L) 06/30/2019 1551   MCH 26.8 07/11/2021 1109   MCHC 31.0 07/11/2021 1109   RDW 25.3 (H) 07/11/2021 1109   RDW 16.8 (H) 06/30/2019 1551   LYMPHSABS 1.1 07/11/2021 1109   LYMPHSABS 3.4 (H) 06/30/2019 1551   MONOABS 1.4 (H) 07/11/2021 1109   EOSABS 0.0 07/11/2021 1109   EOSABS 0.1 06/30/2019 1551   BASOSABS 0.1 07/11/2021 1109   BASOSABS 0.1 06/30/2019 1551    CMP     Component Value Date/Time   NA 138 07/11/2021 1109   NA 138 08/20/2016 1712   K 3.9 07/11/2021 1109   CL 104 07/11/2021 1109   CO2 28 07/11/2021 1109   GLUCOSE 100 (H) 07/11/2021 1109   BUN 11 07/11/2021 1109   BUN 11 08/20/2016 1712   CREATININE 0.88 07/11/2021 1109   CALCIUM 9.2 07/11/2021 1109   PROT 6.9 07/11/2021 1109   PROT 6.9 08/20/2016 1712   ALBUMIN 3.8 07/11/2021 1109   ALBUMIN 4.0 08/20/2016 1712   AST 23 07/11/2021 1109   ALT 23 07/11/2021 1109   ALKPHOS 105 07/11/2021 1109   BILITOT 0.3 07/11/2021 1109   GFRNONAA >60 07/11/2021 1109   GFRAA 88 08/20/2016 1712     ASSESSMENT and THERAPY PLAN:   Malignant neoplasm of upper-outer quadrant of left breast in female, estrogen receptor positive ([place]) left breast upper outer quadrant biopsy 02/07/2021 for a clinical T2 Ni, stage IIIA invasive ductal carcinoma, grade 3, 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  Neoadjuvant chemotherapy  with weekly Taxol x12 followed by dose dense Adriamycin and Cytoxan  2. 08/21/2021:Left lumpectomy: Negative for residual cancer.  Margins negative, 0/14 lymph nodes negative 3.  Followed by radiation--will complete this on November 07, 2021 4.  Followed by antiestrogen therapy--evaluating hormone levels since she has  not had a menstrual cycle since beginning chemotherapy. 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  This is very understandable and we have recommended that she stay out of work.  She will continue her current medication regimen for neuropathy and I have added on some labs with B12 and vitamin D to further evaluate if there are any other things we can optimize.  I refilled her Cymbalta today and gave her a 90-day supply as well.  She is tolerating her medications well and I am hopeful that they will slowly improve with time.  In regards to her right side pain she is experiencing I ordered a right upper quadrant ultrasound to start the evaluation we will also get CBC and c-Met today.  We will see Talisha back in 2 to 4 weeks to review her lab testing and discuss antiestrogen therapy.   All questions were answered. The patient knows to call the clinic with any problems, questions or concerns. We can certainly see the patient much sooner if necessary.  Total encounter time:30 minutes*in face-to-face visit time, chart review, lab review, care coordination, order entry, and documentation of the encounter time.  Wilber Bihari, NP 10/30/21 9:39 AM Medical Oncology and Hematology [facility] [place], [place] [address] Tel. [phone]    Fax. [phone]  *Total Encounter Time as defined by the Centers for Medicare and Medicaid Services includes, in addition to the face-to-face time of a patient visit (documented in the note above) non-face-to-face time: obtaining and reviewing outside history, ordering and reviewing medications, tests or procedures, care coordination (communications with other health care professionals or caregivers) and documentation in the medical  record.

## 2021-10-30 NOTE — Assessment & Plan Note (Addendum)
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 Margins negative, 0/14 lymph nodes negative 3.Followed by radiation--will complete this on November 07, 2021 4.Followed by antiestrogen therapy--evaluating hormone levels since she has not had a menstrual cycle since beginning chemotherapy. 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  This is very understandable and we have recommended that she stay out of work.  She will continue her current medication regimen for neuropathy and I have added on some labs with B12 and vitamin D to further evaluate if there are any other things we can optimize.  I refilled her Cymbalta today and gave her a 90-day supply as well.  She is tolerating her medications well and I am hopeful that they will slowly improve with time.  In regards to her right side pain she is experiencing I ordered a right upper quadrant ultrasound to start the evaluation we will also get CBC and c-Met today.  We will see Talisha back in 2 to 4 weeks to review her lab testing and discuss antiestrogen therapy.

## 2021-10-30 NOTE — Progress Notes (Signed)
Orders entered per Wilber Bihari, NP. Spoke with Melford Aase, lab technician who states she will run these labs off pt's B12 that was drawn today. She knows to call [phone] if she is not able to see orders.

## 2021-10-30 NOTE — Progress Notes (Signed)
Orders for ergocalciferol 50000 u weekly ordered per NP. Attempted to call pt; no answer, VM box full. Mychart message sent.

## 2021-10-31 ENCOUNTER — Ambulatory Visit
Admission: RE | Admit: 2021-10-31 | Discharge: 2021-10-31 | Disposition: A | Discharge status: Home / Self Care | Payer: Managed Care, Other (non HMO) | Source: Ambulatory Visit | Attending: Radiation Oncology | Admitting: Radiation Oncology

## 2021-10-31 ENCOUNTER — Telehealth: Payer: Self-pay | Admitting: Hematology and Oncology

## 2021-10-31 ENCOUNTER — Telehealth: Payer: Self-pay

## 2021-10-31 ENCOUNTER — Other Ambulatory Visit: Payer: Self-pay

## 2021-10-31 DIAGNOSIS — Z510 Encounter for antineoplastic radiation therapy: Secondary | ICD-10-CM | POA: Diagnosis not present

## 2021-10-31 LAB — RAD ONC ARIA SESSION SUMMARY
Course Elapsed Days: 31
Plan Fractions Treated to Date: 21
Plan Fractions Treated to Date: 21
Plan Prescribed Dose Per Fraction: 2 Gy
Plan Prescribed Dose Per Fraction: 2 Gy
Plan Total Fractions Prescribed: 25
Plan Total Fractions Prescribed: 25
Plan Total Prescribed Dose: 50 Gy
Plan Total Prescribed Dose: 50 Gy
Reference Point Dosage Given to Date: 42 Gy
Reference Point Dosage Given to Date: 42 Gy
Reference Point Session Dosage Given: 2 Gy
Reference Point Session Dosage Given: 2 Gy
Session Number: 21

## 2021-10-31 LAB — FOLLICLE STIMULATING HORMONE: FSH: 147 m[IU]/mL

## 2021-10-31 LAB — LUTEINIZING HORMONE: LH: 73.9 m[IU]/mL

## 2021-10-31 NOTE — Telephone Encounter (Signed)
-----   Message from [name], NP sent at 10/30/2021  6:54 PM EDT ----- She is iron deficient.  I recommend that she receive iv iron.  If agreeable let me know and I will place orders once scheduled.  If she wants to wait a couple weeks uintil her f/u that's fine too.  ----- Message ----- From: Buel Ream, Lab In [place] Sent: 10/30/2021   4:56 PM EDT To: [name], NP

## 2021-10-31 NOTE — Telephone Encounter (Signed)
.  Called patient to schedule appointment per 6/15 inbasket, left pt msg

## 2021-10-31 NOTE — Telephone Encounter (Signed)
Called and spoke with pt.  Pt aware of labs via mychart. Pt wants to proceed with IV iron therapy.  Provider and scheduler notified.

## 2021-11-01 ENCOUNTER — Ambulatory Visit
Admission: RE | Admit: 2021-11-01 | Discharge: 2021-11-01 | Disposition: A | Discharge status: Home / Self Care | Payer: Managed Care, Other (non HMO) | Source: Ambulatory Visit | Attending: Radiation Oncology | Admitting: Radiation Oncology

## 2021-11-01 ENCOUNTER — Other Ambulatory Visit: Payer: Self-pay

## 2021-11-01 ENCOUNTER — Telehealth: Payer: Self-pay

## 2021-11-01 ENCOUNTER — Other Ambulatory Visit: Payer: Self-pay | Admitting: Adult Health

## 2021-11-01 DIAGNOSIS — Z170 Estrogen receptor positive status [ER+]: Secondary | ICD-10-CM

## 2021-11-01 DIAGNOSIS — Z510 Encounter for antineoplastic radiation therapy: Secondary | ICD-10-CM | POA: Diagnosis not present

## 2021-11-01 DIAGNOSIS — D508 Other iron deficiency anemias: Secondary | ICD-10-CM

## 2021-11-01 LAB — RAD ONC ARIA SESSION SUMMARY
Course Elapsed Days: 32
Plan Fractions Treated to Date: 22
Plan Fractions Treated to Date: 22
Plan Prescribed Dose Per Fraction: 2 Gy
Plan Prescribed Dose Per Fraction: 2 Gy
Plan Total Fractions Prescribed: 25
Plan Total Fractions Prescribed: 25
Plan Total Prescribed Dose: 50 Gy
Plan Total Prescribed Dose: 50 Gy
Reference Point Dosage Given to Date: 44 Gy
Reference Point Dosage Given to Date: 44 Gy
Reference Point Session Dosage Given: 2 Gy
Reference Point Session Dosage Given: 2 Gy
Session Number: 22

## 2021-11-01 NOTE — Progress Notes (Signed)
Iv iron orders placed for patient ferritin of 7.  Wilber Bihari, NP 11/01/21 7:43 AM Medical Oncology and Hematology [facility] [place], [place] [address] Tel. [phone]    Fax. [phone]

## 2021-11-01 NOTE — Telephone Encounter (Signed)
-----   Message from [name], NP sent at 11/01/2021  7:44 AM EDT ----- Her vitamin d level is low.  She needs to take ergocalciferol, 50,000 units once a week, disp 12, no refills with f/u in 3 months for lab only.  Lab only should include CBC, CMET, Vit d, vitamin b12, ferritin, iron panel.   Thanks, LC ----- Message ----- From: Interface, Lab In [place] Sent: 10/30/2021  10:03 AM EDT To: [name], NP

## 2021-11-01 NOTE — Telephone Encounter (Signed)
error 

## 2021-11-02 LAB — ESTRADIOL, ULTRA SENS: Estradiol, Sensitive: 2.8 pg/mL

## 2021-11-04 ENCOUNTER — Ambulatory Visit: Payer: Managed Care, Other (non HMO)

## 2021-11-04 ENCOUNTER — Ambulatory Visit
Admission: RE | Admit: 2021-11-04 | Discharge: 2021-11-04 | Disposition: A | Discharge status: Home / Self Care | Payer: Managed Care, Other (non HMO) | Source: Ambulatory Visit | Attending: Radiation Oncology | Admitting: Radiation Oncology

## 2021-11-04 ENCOUNTER — Other Ambulatory Visit: Payer: Self-pay

## 2021-11-04 DIAGNOSIS — Z170 Estrogen receptor positive status [ER+]: Secondary | ICD-10-CM

## 2021-11-04 DIAGNOSIS — Z510 Encounter for antineoplastic radiation therapy: Secondary | ICD-10-CM | POA: Diagnosis not present

## 2021-11-04 LAB — RAD ONC ARIA SESSION SUMMARY
Course Elapsed Days: 35
Plan Fractions Treated to Date: 23
Plan Fractions Treated to Date: 23
Plan Prescribed Dose Per Fraction: 2 Gy
Plan Prescribed Dose Per Fraction: 2 Gy
Plan Total Fractions Prescribed: 25
Plan Total Fractions Prescribed: 25
Plan Total Prescribed Dose: 50 Gy
Plan Total Prescribed Dose: 50 Gy
Reference Point Dosage Given to Date: 46 Gy
Reference Point Dosage Given to Date: 46 Gy
Reference Point Session Dosage Given: 2 Gy
Reference Point Session Dosage Given: 2 Gy
Session Number: 23

## 2021-11-04 MED ORDER — SILVER SULFADIAZINE 1 % EX CREA: TOPICAL_CREAM | Freq: Every day | CUTANEOUS | Status: DC

## 2021-11-05 ENCOUNTER — Ambulatory Visit: Payer: Managed Care, Other (non HMO)

## 2021-11-05 ENCOUNTER — Other Ambulatory Visit: Payer: Self-pay | Admitting: Surgery

## 2021-11-05 ENCOUNTER — Other Ambulatory Visit: Payer: Self-pay

## 2021-11-05 ENCOUNTER — Encounter: Payer: Self-pay | Admitting: *Deleted

## 2021-11-05 ENCOUNTER — Ambulatory Visit
Admission: RE | Admit: 2021-11-05 | Discharge: 2021-11-05 | Disposition: A | Discharge status: Home / Self Care | Payer: Managed Care, Other (non HMO) | Source: Ambulatory Visit | Attending: Radiation Oncology | Admitting: Radiation Oncology

## 2021-11-05 ENCOUNTER — Encounter: Payer: Self-pay | Admitting: Adult Health

## 2021-11-05 DIAGNOSIS — Z170 Estrogen receptor positive status [ER+]: Secondary | ICD-10-CM

## 2021-11-05 DIAGNOSIS — Z510 Encounter for antineoplastic radiation therapy: Secondary | ICD-10-CM | POA: Diagnosis not present

## 2021-11-05 LAB — RAD ONC ARIA SESSION SUMMARY
Course Elapsed Days: 36
Plan Fractions Treated to Date: 24
Plan Fractions Treated to Date: 24
Plan Prescribed Dose Per Fraction: 2 Gy
Plan Prescribed Dose Per Fraction: 2 Gy
Plan Total Fractions Prescribed: 25
Plan Total Fractions Prescribed: 25
Plan Total Prescribed Dose: 50 Gy
Plan Total Prescribed Dose: 50 Gy
Reference Point Dosage Given to Date: 48 Gy
Reference Point Dosage Given to Date: 48 Gy
Reference Point Session Dosage Given: 2 Gy
Reference Point Session Dosage Given: 2 Gy
Session Number: 24

## 2021-11-06 ENCOUNTER — Other Ambulatory Visit: Payer: Self-pay

## 2021-11-06 ENCOUNTER — Ambulatory Visit: Payer: Managed Care, Other (non HMO)

## 2021-11-06 ENCOUNTER — Ambulatory Visit
Admission: RE | Admit: 2021-11-06 | Discharge: 2021-11-06 | Disposition: A | Discharge status: Home / Self Care | Payer: Managed Care, Other (non HMO) | Source: Ambulatory Visit | Attending: Radiation Oncology | Admitting: Radiation Oncology

## 2021-11-06 ENCOUNTER — Other Ambulatory Visit: Payer: Self-pay | Admitting: *Deleted

## 2021-11-06 ENCOUNTER — Ambulatory Visit (HOSPITAL_COMMUNITY): Admission: RE | Admit: 2021-11-06 | Payer: Managed Care, Other (non HMO) | Source: Ambulatory Visit

## 2021-11-06 ENCOUNTER — Telehealth: Payer: Self-pay | Admitting: Adult Health

## 2021-11-06 ENCOUNTER — Encounter: Payer: Self-pay | Admitting: Radiation Oncology

## 2021-11-06 DIAGNOSIS — Z170 Estrogen receptor positive status [ER+]: Secondary | ICD-10-CM

## 2021-11-06 DIAGNOSIS — Z510 Encounter for antineoplastic radiation therapy: Secondary | ICD-10-CM | POA: Diagnosis not present

## 2021-11-06 LAB — RAD ONC ARIA SESSION SUMMARY
Course Elapsed Days: 37
Plan Fractions Treated to Date: 25
Plan Fractions Treated to Date: 25
Plan Prescribed Dose Per Fraction: 2 Gy
Plan Prescribed Dose Per Fraction: 2 Gy
Plan Total Fractions Prescribed: 25
Plan Total Fractions Prescribed: 25
Plan Total Prescribed Dose: 50 Gy
Plan Total Prescribed Dose: 50 Gy
Reference Point Dosage Given to Date: 50 Gy
Reference Point Dosage Given to Date: 50 Gy
Reference Point Session Dosage Given: 2 Gy
Reference Point Session Dosage Given: 2 Gy
Session Number: 25

## 2021-11-06 MED ORDER — ANASTROZOLE 1 MG PO TABS: 1.0000 mg | ORAL_TABLET | Freq: Every day | ORAL | 0 refills | Status: DC

## 2021-11-06 NOTE — Progress Notes (Signed)
Per Wilber Bihari, NP pt needing to start Anastrozole 1 mg tablet p.o daily since pt is post menopausal.  Prescription sent to pharmacy on file, pt educated and verbalized understanding.

## 2021-11-06 NOTE — Telephone Encounter (Signed)
.  Called pt per 6/20 inbasket , Patient was unavailable, a message with appt time and date was left with number on file.

## 2021-11-07 ENCOUNTER — Ambulatory Visit: Payer: Managed Care, Other (non HMO)

## 2021-11-07 ENCOUNTER — Other Ambulatory Visit: Payer: Self-pay | Admitting: Nurse Practitioner

## 2021-11-13 ENCOUNTER — Inpatient Hospital Stay: Payer: Managed Care, Other (non HMO)

## 2021-11-13 ENCOUNTER — Telehealth: Payer: Self-pay | Admitting: *Deleted

## 2021-11-13 VITALS — BP 135/87 | HR 89 | Temp 98.0°F | Resp 17

## 2021-11-13 DIAGNOSIS — Z510 Encounter for antineoplastic radiation therapy: Secondary | ICD-10-CM | POA: Diagnosis not present

## 2021-11-13 DIAGNOSIS — Z95828 Presence of other vascular implants and grafts: Secondary | ICD-10-CM

## 2021-11-13 MED ORDER — SODIUM CHLORIDE 0.9% FLUSH
10.0000 mL | Freq: Once | INTRAVENOUS | Status: AC | PRN
  Administered 2021-11-13: 10 mL

## 2021-11-13 MED ORDER — HEPARIN SOD (PORK) LOCK FLUSH 100 UNIT/ML IV SOLN
500.0000 [IU] | Freq: Once | INTRAVENOUS | Status: AC | PRN
  Administered 2021-11-13: 500 [IU]

## 2021-11-13 MED ORDER — SODIUM CHLORIDE 0.9 % IV SOLN
400.0000 mg | Freq: Once | INTRAVENOUS | Status: AC
  Administered 2021-11-13: 400 mg via INTRAVENOUS
  Filled 2021-11-13: qty 20

## 2021-11-13 MED ORDER — LORATADINE 10 MG PO TABS
10.0000 mg | ORAL_TABLET | Freq: Once | ORAL | Status: AC
  Administered 2021-11-13: 10 mg via ORAL
  Filled 2021-11-13: qty 1

## 2021-11-13 MED ORDER — SODIUM CHLORIDE 0.9 % IV SOLN: Freq: Once | INTRAVENOUS | Status: AC

## 2021-11-13 NOTE — Telephone Encounter (Signed)
Disability paperwork completed today by this nurse.  Folder currently placed for collaborative pick up in bin designated for patient assigned provider.  Awaiting return to this nurse upon provider review and signature.

## 2021-11-13 NOTE — Patient Instructions (Signed)

## 2021-11-14 ENCOUNTER — Ambulatory Visit (HOSPITAL_COMMUNITY)
Admission: RE | Admit: 2021-11-14 | Discharge: 2021-11-14 | Disposition: A | Discharge status: Home / Self Care | Payer: Managed Care, Other (non HMO) | Source: Ambulatory Visit | Attending: Adult Health | Admitting: Adult Health

## 2021-11-14 DIAGNOSIS — R1011 Right upper quadrant pain: Secondary | ICD-10-CM | POA: Diagnosis present

## 2021-11-14 DIAGNOSIS — Z170 Estrogen receptor positive status [ER+]: Secondary | ICD-10-CM | POA: Insufficient documentation

## 2021-11-14 DIAGNOSIS — C50412 Malignant neoplasm of upper-outer quadrant of left female breast: Secondary | ICD-10-CM | POA: Insufficient documentation

## 2021-11-15 NOTE — Telephone Encounter (Signed)
[employer] paperwork received today by this nurse successfully faxed to [facility] [phone].  Original copy to alphabetical file folder in appointment registration area near representative number one for pick up as requested per patient.  Process completed with copy to bin designated for items to be scanned.  No further instructions received, actions required or performed by this nurse.

## 2021-11-18 ENCOUNTER — Telehealth: Payer: Self-pay

## 2021-11-18 NOTE — Telephone Encounter (Signed)
-----   Message from [name], NP sent at 11/18/2021  1:35 PM EDT ----- Ultrasound shows fatty liver and a benign cyst, no concerns for cancer ----- Message ----- From: Interface, Rad Results In Sent: 11/14/2021  12:27 PM EDT To: [name], NP

## 2021-11-18 NOTE — Telephone Encounter (Signed)
Called pt, informed her of ultrasound findings, fatty liver and benign cyst, no cancer.  Pt verbalized understanding and thanks

## 2021-11-20 ENCOUNTER — Inpatient Hospital Stay: Payer: Managed Care, Other (non HMO) | Attending: Oncology

## 2021-11-20 ENCOUNTER — Other Ambulatory Visit: Payer: Self-pay | Admitting: *Deleted

## 2021-11-20 ENCOUNTER — Other Ambulatory Visit: Payer: Self-pay

## 2021-11-20 VITALS — BP 123/86 | HR 71 | Temp 98.3°F

## 2021-11-20 DIAGNOSIS — D509 Iron deficiency anemia, unspecified: Secondary | ICD-10-CM | POA: Insufficient documentation

## 2021-11-20 DIAGNOSIS — C50412 Malignant neoplasm of upper-outer quadrant of left female breast: Secondary | ICD-10-CM | POA: Insufficient documentation

## 2021-11-20 DIAGNOSIS — Z170 Estrogen receptor positive status [ER+]: Secondary | ICD-10-CM | POA: Diagnosis not present

## 2021-11-20 DIAGNOSIS — Z79811 Long term (current) use of aromatase inhibitors: Secondary | ICD-10-CM | POA: Insufficient documentation

## 2021-11-20 DIAGNOSIS — Z95828 Presence of other vascular implants and grafts: Secondary | ICD-10-CM

## 2021-11-20 MED ORDER — SODIUM CHLORIDE 0.9% FLUSH
10.0000 mL | Freq: Once | INTRAVENOUS | Status: AC | PRN
  Administered 2021-11-20: 10 mL

## 2021-11-20 MED ORDER — LORATADINE 10 MG PO TABS
10.0000 mg | ORAL_TABLET | Freq: Once | ORAL | Status: AC
  Administered 2021-11-20: 10 mg via ORAL
  Filled 2021-11-20: qty 1

## 2021-11-20 MED ORDER — HEPARIN SOD (PORK) LOCK FLUSH 100 UNIT/ML IV SOLN
500.0000 [IU] | Freq: Once | INTRAVENOUS | Status: AC | PRN
  Administered 2021-11-20: 500 [IU]

## 2021-11-20 MED ORDER — SODIUM CHLORIDE 0.9 % IV SOLN: Freq: Once | INTRAVENOUS | Status: AC

## 2021-11-20 MED ORDER — DULOXETINE HCL 20 MG PO CPEP: 20.0000 mg | ORAL_CAPSULE | Freq: Two times a day (BID) | ORAL | 3 refills | Status: DC

## 2021-11-20 MED ORDER — SODIUM CHLORIDE 0.9 % IV SOLN
400.0000 mg | Freq: Once | INTRAVENOUS | Status: AC
  Administered 2021-11-20: 400 mg via INTRAVENOUS
  Filled 2021-11-20: qty 20

## 2021-11-20 NOTE — Patient Instructions (Signed)

## 2021-11-20 NOTE — Progress Notes (Signed)
Received call from pt stating insurance will not cover Cymbalta 40 mg daily and requesting script for Cymbalta 20 mg p.o BID.  RN contacted pharmacy on file and they stated the same as pt.  Updated prescription sent to pharmacy on file for Cymbalta 20 mg BID to be covered by insurance.

## 2021-11-22 ENCOUNTER — Telehealth: Payer: Self-pay | Admitting: Adult Health

## 2021-11-22 ENCOUNTER — Encounter: Payer: Self-pay | Admitting: Hematology and Oncology

## 2021-11-22 ENCOUNTER — Inpatient Hospital Stay (HOSPITAL_BASED_OUTPATIENT_CLINIC_OR_DEPARTMENT_OTHER): Payer: Managed Care, Other (non HMO) | Admitting: Adult Health

## 2021-11-22 ENCOUNTER — Encounter: Payer: Self-pay | Admitting: Emergency Medicine

## 2021-11-22 ENCOUNTER — Other Ambulatory Visit: Payer: Self-pay

## 2021-11-22 VITALS — BP 138/104 | HR 92 | Temp 97.9°F | Wt 192.6 lb

## 2021-11-22 DIAGNOSIS — Z170 Estrogen receptor positive status [ER+]: Secondary | ICD-10-CM

## 2021-11-22 DIAGNOSIS — T451X5A Adverse effect of antineoplastic and immunosuppressive drugs, initial encounter: Secondary | ICD-10-CM

## 2021-11-22 DIAGNOSIS — C50412 Malignant neoplasm of upper-outer quadrant of left female breast: Secondary | ICD-10-CM

## 2021-11-22 DIAGNOSIS — G479 Sleep disorder, unspecified: Secondary | ICD-10-CM | POA: Diagnosis not present

## 2021-11-22 DIAGNOSIS — D508 Other iron deficiency anemias: Secondary | ICD-10-CM | POA: Diagnosis not present

## 2021-11-22 DIAGNOSIS — G620 Drug-induced polyneuropathy: Secondary | ICD-10-CM

## 2021-11-22 DIAGNOSIS — E559 Vitamin D deficiency, unspecified: Secondary | ICD-10-CM | POA: Diagnosis not present

## 2021-11-22 DIAGNOSIS — D509 Iron deficiency anemia, unspecified: Secondary | ICD-10-CM | POA: Diagnosis not present

## 2021-11-22 MED ORDER — MELATONIN 3 MG PO TABS: 3.0000 mg | ORAL_TABLET | Freq: Every day | ORAL | 0 refills | Status: AC

## 2021-11-22 MED ORDER — PANTOPRAZOLE SODIUM 40 MG PO TBEC: 40.0000 mg | DELAYED_RELEASE_TABLET | Freq: Every day | ORAL | 3 refills | Status: DC

## 2021-11-22 MED ORDER — LINACLOTIDE 72 MCG PO CAPS: 72.0000 ug | ORAL_CAPSULE | Freq: Every day | ORAL | 3 refills | Status: DC

## 2021-11-22 NOTE — Progress Notes (Unsigned)
Pt is taking Anastrazole as prescribed but is having hot flashes, sweating, and still having neuropathy pain in both feet.  Wilber Bihari, NP, made aware.

## 2021-11-22 NOTE — Research (Signed)
[facility]U-[place]-2102 - TREATMENT OF ESTABLISHED CHEMOTHERAPY-INDUCED NEUROPATHY WITH N-PALMITOYLETHANOLAMIDE, A CANNABIMIMETIC NUTRACEUTICAL: A RANDOMIZED DOUBLE-BLIND PHASE II PILOT TRIAL  Patient referred by NP Mendel Ryder for study.  Pt has taken PEA supplement before which makes her ineligible.  Pt verbalizes understanding that she cannot enroll in study.  Pt provided with contact information if she has any further questions or concerns.  NP aware.  Wells Guiles 'Learta CoddingNeysa Bonito, RN, BSN Clinical Research Nurse I 11/22/21 12:02 PM

## 2021-11-22 NOTE — Progress Notes (Signed)
                                                                                                                                                             Patient Name: Catherine Ellison MRN: [id number] DOB: 07-14-1975 Referring Physician: Nicholas Lose (Profile Not Attached) Date of Service: 11/06/2021 [facility], [place]                                                        End Of Treatment Note  Diagnoses: C50.412-Malignant neoplasm of upper-outer quadrant of left female breast  Cancer Staging:  Cancer Staging  Malignant neoplasm of upper-outer quadrant of left breast in female, estrogen receptor positive ([place]) Staging form: Breast, AJCC 8th Edition - Clinical stage from 02/13/2021: Stage IIIA (cT2, cN1, cM0, G3, ER+, PR-, HER2-) - Signed by [name], MD on 02/13/2021 Stage prefix: Initial diagnosis Histologic grading system: 3 grade system Laterality: Left Staged by: Pathologist and managing physician Stage used in treatment planning: Yes National guidelines used in treatment planning: Yes Type of national guideline used in treatment planning: NCCN - Pathologic stage from 08/21/2021: pT0, pN0, cM0 - Signed by [name], NP on 10/30/2021 Stage prefix: Initial diagnosis   Intent: Curative  Radiation Treatment Dates: 09/30/2021 through 11/06/2021 Site Technique Total Dose (Gy) Dose per Fx (Gy) Completed Fx Beam Energies  Breast, Left: Breast_L 3D 50/50 2 25/25 10XFFF  Breast, Left: Breast_L_SCV_PAB 3D 50/50 2 25/25 6X, 10X   Narrative: The patient tolerated radiation therapy relatively well.   Plan: The patient will follow-up with radiation oncology in 42mo. -----------------------------------  Eppie Gibson, MD

## 2021-11-22 NOTE — Telephone Encounter (Signed)
Patient scheduled for Iron infusion at a different location. Patient is aware of appointment details.

## 2021-11-22 NOTE — Progress Notes (Signed)
[facility] Follow up:    Catherine Mola, NP No address on file   DIAGNOSIS: Cancer Staging  Malignant neoplasm of upper-outer quadrant of left breast in female, estrogen receptor positive ([place]) Staging form: Breast, AJCC 8th Edition - Clinical stage from 02/13/2021: Stage IIIA (cT2, cN1, cM0, G3, ER+, PR-, HER2-) - Signed by [name], MD on 02/13/2021 Stage prefix: Initial diagnosis Histologic grading system: 3 grade system Laterality: Left Staged by: Pathologist and managing physician Stage used in treatment planning: Yes National guidelines used in treatment planning: Yes Type of national guideline used in treatment planning: NCCN - Pathologic stage from 08/21/2021: pT0, pN0, cM0 - Signed by [name], NP on 10/30/2021 Stage prefix: Initial diagnosis   SUMMARY OF ONCOLOGIC HISTORY: Oncology History  Malignant neoplasm of upper-outer quadrant of left breast in female, estrogen receptor positive ([place])  02/11/2021 Initial Diagnosis   left breast upper outer quadrant biopsy 02/07/2021 for a clinical T2 Ni, stage IIIA invasive ductal carcinoma, grade 3, ER 70% weak, PR 0% and HER2 negative, Ki-67: 40%   02/13/2021 Cancer Staging   Staging form: Breast, AJCC 8th Edition - Clinical stage from 02/13/2021: Stage IIIA (cT2, cN1, cM0, G3, ER+, PR-, HER2-) - Signed by [name], MD on 02/13/2021 Stage prefix: Initial diagnosis Histologic grading system: 3 grade system Laterality: Left Staged by: Pathologist and managing physician Stage used in treatment planning: Yes National guidelines used in treatment planning: Yes Type of national guideline used in treatment planning: NCCN   02/19/2021 Genetic Testing   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    Genes tested: APC, ATM, AXIN2, BARD1, BMPR1A,  BRCA1, BRCA2, BRIP1, CDH1, CDK4, CDKN2A, CHEK2, DICER1, EPCAM, GREM1, HOXB13, MEN1, MLH1, MSH2, MSH3, MSH6, MUTYH, NBN, NF1, NF2, NTHL1, PALB2, PMS2, POLD1, POLE, PTEN, RAD51C, RAD51D, RECQL, RET, SDHA, SDHAF2, SDHB, SDHC, SDHD, SMAD4, SMARCA4, STK11, TP53, TSC1, TSC2, and VHL.  RNA data is routinely analyzed for use in variant interpretation for all genes.  Recommendations:     Management Recommendations for individuals with a single MUTYH pathogenic variant:  Changes in the MUTYH gene are associated with MUTYH-associated Polyposis syndrome (MAP). MAP is a hereditary cancer condition in which patients have high risks for colorectal cancer due to a large number of adenomatous polyps in the gastrointestinal system. MAP is unique among the hereditary cancer syndromes in that it is a "recessive" condition. Most hereditary cancer conditions are "dominant", meaning the condition is present in patients with a mutation in one copy of the gene. Patients have MAP only if there are mutations in both of their copies of the MUTYH gene. 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    Colon Cancer Screening: If an individual has a first-degree relative with colorectal cancer, screening should begin at age 82 or 77 years prior to the relative's age at diagnosis, whichever comes first and repeat colonoscopies every 5 years.   If an individual has no first-degree relatives with colorectal cancer, data is unclear if specialized screening is warranted. We advise these patients to follow their gastroenterologists recommendations.   03/01/2021 -  Neo-Adjuvant Chemotherapy   neoadjuvant chemotherapy will consist of paclitaxel weekly x12 followed by dose dense doxorubicin and cyclophosphamide x4   08/21/2021 Surgery   Left lumpectomy:  Negative for residual cancer.  Margins negative, 0/14 lymph nodes negative   08/21/2021 Cancer Staging    Staging form: Breast, AJCC 8th Edition - Pathologic stage from 08/21/2021: pT0, pN0, cM0 - Signed by [name], NP on 10/30/2021 Stage prefix: Initial diagnosis   09/30/2021 - 11/07/2021 Radiation Therapy   Adjuvant radiation     CURRENT THERAPY: Venofer  INTERVAL HISTORY: Catherine Ellison 46 y.o. female returns for    Patient Active Problem List   Diagnosis Date Noted  . S/P lumpectomy, left breast 08/21/2021  . Port-A-Cath in place 03/14/2021  . Monoallelic mutation of MUTYH gene 03/04/2021  . Genetic testing 02/20/2021  . Family history of breast cancer 02/13/2021  . Family history of colon cancer 02/13/2021  . Malignant neoplasm of upper-outer quadrant of left breast in female, estrogen receptor positive ([place]) 02/11/2021  . Cervical cancer screening 06/30/2019  . Menorrhagia with regular cycle 06/30/2019  . Vaginal bleeding 06/30/2019  . Adjustment disorder with mixed anxiety and depressed mood 11/06/2016    is allergic to latex.  MEDICAL HISTORY: Past Medical History:  Diagnosis Date  . Allergy    seasonal   . Anemia   . Dyspnea    with heavy exertion  . GERD (gastroesophageal reflux disease)   . History of gestational diabetes 2005  . Hyperemesis gravidarum    with all pregnancies  . IBS (irritable bowel syndrome)   . left breast cancer   . Peptic ulcer   . Pneumonia     SURGICAL HISTORY: Past Surgical History:  Procedure Laterality Date  . BILATERAL SALPINGECTOMY Bilateral 2014   emergency  . IR CV LINE INJECTION  05/29/2021  . LEFT OOPHORECTOMY Left 2006  . PORTACATH PLACEMENT Right 02/28/2021   Procedure: INSERTION PORT-A-CATH;  Surgeon: Coralie Keens, MD;  Location: [facility];  Service: General;  Laterality: Right;  . RADIOACTIVE SEED GUIDED PARTIAL MASTECTOMY/AXILLARY SENTINEL NODE BIOPSY/AXILLARY NODE DISSECTION Left 08/21/2021   Procedure: LEFT BREAST RADIOACTIVE SEED GUIDED LUMPECTOMY AND COMPLETE LEFT AXILLARY  LYMPH NODE DISSECTION;  Surgeon: Coralie Keens, MD;  Location: [place];  Service: General;  Laterality: Left;  LMA PECTORAL BLOCK    SOCIAL HISTORY: Social History   Socioeconomic History  . Marital status: Divorced    Spouse name: Not on file  . Number of children: 3  . Years of education: Not on file  . Highest education level: Not on file  Occupational History  . Occupation: nurse    Comment: mental health  Tobacco Use  . Smoking status: Never  . Smokeless tobacco: Never  Vaping Use  . Vaping Use: Never used  Substance and Sexual Activity  . Alcohol use: Yes    Alcohol/week: 5.0 standard drinks of alcohol    Types: 1 Glasses of wine, 1 Cans of beer, 1 Shots of liquor, 2 Standard drinks or equivalent per week    Comment: Occasional  . Drug use: No  . Sexual activity: Yes  Other Topics Concern  . Not on file  Social History Narrative   Lives with her youngest child.   Social Determinants of Health   Financial Resource Strain: Medium Risk (02/13/2021)   Overall Financial Resource Strain (CARDIA)   . Difficulty of Paying Living Expenses: Somewhat hard  Food Insecurity: No Food Insecurity (02/13/2021)   Hunger Vital Sign   . Worried About Charity fundraiser in the Last Year: Never true   . Ran Out of Food in the Last Year:  Never true  Transportation Needs: No Transportation Needs (02/13/2021)   PRAPARE - Transportation   . Lack of Transportation (Medical): No   . Lack of Transportation (Non-Medical): No  Physical Activity: Not on file  Stress: Not on file  Social Connections: Not on file  Intimate Partner Violence: Not on file    FAMILY HISTORY: Family History  Problem Relation Age of Onset  . Diabetes Mother   . Hypertension Mother   . Thyroid disease Mother   . Diabetes Father   . Heart disease Father        4V CABG 03/2016, age 45  . Hyperlipidemia Father   . Hypertension Father   . Hypothyroidism Father   . Diabetes Sister   .  Hypertension Sister   . Hypertension Maternal Grandmother   . Colon cancer Maternal Grandmother        dx. >50  . Hypertension Maternal Grandfather   . Hypertension Paternal Grandmother   . Kidney disease Paternal Grandmother   . Hypertension Paternal Grandfather   . Lung cancer Paternal Grandfather   . Breast cancer Maternal Aunt        dx. 40s    Review of Systems - Oncology    PHYSICAL EXAMINATION  ECOG PERFORMANCE STATUS: {CHL ONC ECOG PZ:[id number]}  Vitals:   11/22/21 1106  BP: (!) 138/104  Pulse: 92  Temp: 97.9 F (36.6 C)  SpO2: 100%    Physical Exam  LABORATORY DATA:  CBC    Component Value Date/Time   WBC 5.1 10/30/2021 0949   RBC 4.83 10/30/2021 0949   HGB 11.5 (L) 10/30/2021 0949   HGB 9.5 (L) 06/30/2019 1551   HCT 37.0 10/30/2021 0949   HCT 30.8 (L) 06/30/2019 1551   PLT 326 10/30/2021 0949   PLT 389 06/30/2019 1551   MCV 76.6 (L) 10/30/2021 0949   MCV 72 (L) 06/30/2019 1551   MCH 23.8 (L) 10/30/2021 0949   MCHC 31.1 10/30/2021 0949   RDW 15.8 (H) 10/30/2021 0949   RDW 16.8 (H) 06/30/2019 1551   LYMPHSABS 0.9 10/30/2021 0949   LYMPHSABS 3.4 (H) 06/30/2019 1551   MONOABS 0.4 10/30/2021 0949   EOSABS 0.1 10/30/2021 0949   EOSABS 0.1 06/30/2019 1551   BASOSABS 0.0 10/30/2021 0949   BASOSABS 0.1 06/30/2019 1551    CMP     Component Value Date/Time   NA 138 10/30/2021 0949   NA 138 08/20/2016 1712   K 3.9 10/30/2021 0949   CL 104 10/30/2021 0949   CO2 26 10/30/2021 0949   GLUCOSE 154 (H) 10/30/2021 0949   BUN 12 10/30/2021 0949   BUN 11 08/20/2016 1712   CREATININE 0.87 10/30/2021 0949   CALCIUM 10.2 10/30/2021 0949   PROT 8.0 10/30/2021 0949   PROT 6.9 08/20/2016 1712   ALBUMIN 4.3 10/30/2021 0949   ALBUMIN 4.0 08/20/2016 1712   AST 21 10/30/2021 0949   ALT 16 10/30/2021 0949   ALKPHOS 126 10/30/2021 0949   BILITOT 0.4 10/30/2021 0949   GFRNONAA >60 10/30/2021 0949   GFRAA 88 08/20/2016 1712       PENDING  LABS:   RADIOGRAPHIC STUDIES:  No results found.   PATHOLOGY:     ASSESSMENT and THERAPY PLAN:   No problem-specific Assessment & Plan notes found for this encounter.   No orders of the defined types were placed in this encounter.   All questions were answered. The patient knows to call the clinic with any problems, questions or concerns. We can certainly  see the patient much sooner if necessary. This note was electronically signed. Scot Dock, NP 11/22/2021

## 2021-11-25 ENCOUNTER — Inpatient Hospital Stay: Payer: Managed Care, Other (non HMO)

## 2021-11-25 ENCOUNTER — Other Ambulatory Visit: Payer: Self-pay | Admitting: *Deleted

## 2021-11-25 ENCOUNTER — Other Ambulatory Visit: Payer: Self-pay | Admitting: Hematology and Oncology

## 2021-11-25 ENCOUNTER — Other Ambulatory Visit: Payer: Self-pay

## 2021-11-25 VITALS — BP 132/96 | HR 91 | Temp 98.0°F | Resp 18 | Ht 71.0 in | Wt 193.8 lb

## 2021-11-25 DIAGNOSIS — D508 Other iron deficiency anemias: Secondary | ICD-10-CM

## 2021-11-25 DIAGNOSIS — D509 Iron deficiency anemia, unspecified: Secondary | ICD-10-CM | POA: Diagnosis not present

## 2021-11-25 DIAGNOSIS — Z419 Encounter for procedure for purposes other than remedying health state, unspecified: Secondary | ICD-10-CM

## 2021-11-25 DIAGNOSIS — Z95828 Presence of other vascular implants and grafts: Secondary | ICD-10-CM

## 2021-11-25 MED ORDER — HEPARIN SOD (PORK) LOCK FLUSH 100 UNIT/ML IV SOLN: 250.0000 [IU] | Freq: Once | INTRAVENOUS | Status: DC | PRN

## 2021-11-25 MED ORDER — SODIUM CHLORIDE 0.9 % IV SOLN
400.0000 mg | Freq: Once | INTRAVENOUS | Status: AC
  Administered 2021-11-25: 400 mg via INTRAVENOUS
  Filled 2021-11-25: qty 20

## 2021-11-25 MED ORDER — SODIUM CHLORIDE 0.9 % IV SOLN: Freq: Once | INTRAVENOUS | Status: AC

## 2021-11-25 MED ORDER — HEPARIN SOD (PORK) LOCK FLUSH 100 UNIT/ML IV SOLN
500.0000 [IU] | Freq: Once | INTRAVENOUS | Status: AC
  Administered 2021-11-25: 500 [IU] via INTRAVENOUS

## 2021-11-25 MED ORDER — LORATADINE 10 MG PO TABS
10.0000 mg | ORAL_TABLET | Freq: Every day | ORAL | Status: DC
  Administered 2021-11-25: 10 mg via ORAL
  Filled 2021-11-25: qty 1

## 2021-11-25 MED ORDER — SODIUM CHLORIDE 0.9% FLUSH: 3.0000 mL | Freq: Once | INTRAVENOUS | Status: DC | PRN

## 2021-11-25 MED ORDER — SODIUM CHLORIDE 0.9% FLUSH
10.0000 mL | Freq: Once | INTRAVENOUS | Status: AC
  Administered 2021-11-25: 10 mL via INTRAVENOUS

## 2021-11-25 MED ORDER — ACETAMINOPHEN 500 MG PO TABS: 1000.0000 mg | ORAL_TABLET | ORAL | Status: DC

## 2021-11-25 NOTE — Patient Instructions (Signed)

## 2021-11-27 ENCOUNTER — Encounter: Payer: Self-pay | Admitting: Hematology and Oncology

## 2021-11-27 ENCOUNTER — Encounter: Payer: Self-pay | Admitting: Adult Health

## 2021-11-27 ENCOUNTER — Telehealth: Payer: Self-pay | Admitting: Adult Health

## 2021-11-27 NOTE — Assessment & Plan Note (Signed)
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 Margins negative, 0/14 lymph nodes negative 3.Followed by radiation--will complete this on November 07, 2021 4.Followed by antiestrogen therapy--evaluating hormone levels since she has not had a menstrual cycle since beginning chemotherapy. -------------------------------------------------------------------------------------------------------------------------------- Neuropathy: I spent a majority of her appointment today talking about the neuropathy.  It is starting to worsen and I am concerned there is a potential alternate etiology therefore we placed a referral to physical therapy and also to neurology today.  She will receive 1 more week of Venofer and I requested this be scheduled.  She will also be referred to GI and this was placed today.  We will see her back in September 2023 for repeat labs and follow-up.

## 2021-11-27 NOTE — Telephone Encounter (Signed)
Scheduled appointment per 7/7 los. Patient is aware.

## 2021-12-09 ENCOUNTER — Encounter: Payer: Managed Care, Other (non HMO) | Admitting: Adult Health

## 2021-12-09 ENCOUNTER — Other Ambulatory Visit: Payer: Self-pay

## 2021-12-09 ENCOUNTER — Ambulatory Visit: Payer: Managed Care, Other (non HMO)

## 2021-12-09 ENCOUNTER — Telehealth: Payer: Self-pay

## 2021-12-09 ENCOUNTER — Telehealth: Payer: Self-pay | Admitting: *Deleted

## 2021-12-09 DIAGNOSIS — D508 Other iron deficiency anemias: Secondary | ICD-10-CM

## 2021-12-09 DIAGNOSIS — C50412 Malignant neoplasm of upper-outer quadrant of left female breast: Secondary | ICD-10-CM

## 2021-12-09 NOTE — Progress Notes (Deleted)
[facility] Follow up:    Catherine Mola, NP No address on file   DIAGNOSIS: Cancer Staging  Malignant neoplasm of upper-outer quadrant of left breast in female, estrogen receptor positive ([place]) Staging form: Breast, AJCC 8th Edition - Clinical stage from 02/13/2021: Stage IIIA (cT2, cN1, cM0, G3, ER+, PR-, HER2-) - Signed by [name], MD on 02/13/2021 Stage prefix: Initial diagnosis Histologic grading system: 3 grade system Laterality: Left Staged by: Pathologist and managing physician Stage used in treatment planning: Yes National guidelines used in treatment planning: Yes Type of national guideline used in treatment planning: NCCN - Pathologic stage from 08/21/2021: pT0, pN0, cM0 - Signed by [name], NP on 10/30/2021 Stage prefix: Initial diagnosis   SUMMARY OF ONCOLOGIC HISTORY: Oncology History  Malignant neoplasm of upper-outer quadrant of left breast in female, estrogen receptor positive ([place])  02/11/2021 Initial Diagnosis   left breast upper outer quadrant biopsy 02/07/2021 for a clinical T2 Ni, stage IIIA invasive ductal carcinoma, grade 3, ER 70% weak, PR 0% and HER2 negative, Ki-67: 40%   02/13/2021 Cancer Staging   Staging form: Breast, AJCC 8th Edition - Clinical stage from 02/13/2021: Stage IIIA (cT2, cN1, cM0, G3, ER+, PR-, HER2-) - Signed by [name], MD on 02/13/2021 Stage prefix: Initial diagnosis Histologic grading system: 3 grade system Laterality: Left Staged by: Pathologist and managing physician Stage used in treatment planning: Yes National guidelines used in treatment planning: Yes Type of national guideline used in treatment planning: NCCN   02/19/2021 Genetic Testing   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    Genes tested: APC, ATM, AXIN2, BARD1, BMPR1A,  BRCA1, BRCA2, BRIP1, CDH1, CDK4, CDKN2A, CHEK2, DICER1, EPCAM, GREM1, HOXB13, MEN1, MLH1, MSH2, MSH3, MSH6, MUTYH, NBN, NF1, NF2, NTHL1, PALB2, PMS2, POLD1, POLE, PTEN, RAD51C, RAD51D, RECQL, RET, SDHA, SDHAF2, SDHB, SDHC, SDHD, SMAD4, SMARCA4, STK11, TP53, TSC1, TSC2, and VHL.  RNA data is routinely analyzed for use in variant interpretation for all genes.  Recommendations:     Management Recommendations for individuals with a single MUTYH pathogenic variant:  Changes in the MUTYH gene are associated with MUTYH-associated Polyposis syndrome (MAP). MAP is a hereditary cancer condition in which patients have high risks for colorectal cancer due to a large number of adenomatous polyps in the gastrointestinal system. MAP is unique among the hereditary cancer syndromes in that it is a "recessive" condition. Most hereditary cancer conditions are "dominant", meaning the condition is present in patients with a mutation in one copy of the gene. Patients have MAP only if there are mutations in both of their copies of the MUTYH gene. 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    Colon Cancer Screening: If an individual has a first-degree relative with colorectal cancer, screening should begin at age 82 or 77 years prior to the relative's age at diagnosis, whichever comes first and repeat colonoscopies every 5 years.   If an individual has no first-degree relatives with colorectal cancer, data is unclear if specialized screening is warranted. We advise these patients to follow their gastroenterologists recommendations.   03/01/2021 -  Neo-Adjuvant Chemotherapy   neoadjuvant chemotherapy will consist of paclitaxel weekly x12 followed by dose dense doxorubicin and cyclophosphamide x4   08/21/2021 Surgery   Left lumpectomy:  Negative for residual cancer.  Margins negative, 0/14 lymph nodes negative   08/21/2021 Cancer Staging    Staging form: Breast, AJCC 8th Edition - Pathologic stage from 08/21/2021: pT0, pN0, cM0 - Signed by [name], NP on 10/30/2021 Stage prefix: Initial diagnosis   09/30/2021 - 11/07/2021 Radiation Therapy   Adjuvant radiation   10/2021 -  Anti-estrogen oral therapy   Anastrozole daily     CURRENT THERAPY: anastrozole  INTERVAL HISTORY: Catherine Ellison 46 y.o. female returns for    Patient Active Problem List   Diagnosis Date Noted   S/P lumpectomy, left breast 08/21/2021   Port-A-Cath in place 10/62/6948   Monoallelic mutation of MUTYH gene 03/04/2021   Genetic testing 02/20/2021   Family history of breast cancer 02/13/2021   Family history of colon cancer 02/13/2021   Malignant neoplasm of upper-outer quadrant of left breast in female, estrogen receptor positive (Catherine Ellison) 02/11/2021   Cervical cancer screening 06/30/2019   Menorrhagia with regular cycle 06/30/2019   Vaginal bleeding 06/30/2019   Adjustment disorder with mixed anxiety and depressed mood 11/06/2016    is allergic to latex.  MEDICAL HISTORY: Past Medical History:  Diagnosis Date   Allergy    seasonal    Anemia    Dyspnea    with heavy exertion   GERD (gastroesophageal reflux disease)    History of gestational diabetes 2005   Hyperemesis gravidarum    with all pregnancies   IBS (irritable bowel syndrome)    left breast cancer    Peptic ulcer    Pneumonia     SURGICAL HISTORY: Past Surgical History:  Procedure Laterality Date   BILATERAL SALPINGECTOMY Bilateral 2014   emergency   IR CV LINE INJECTION  05/29/2021   LEFT OOPHORECTOMY Left 2006   PORTACATH PLACEMENT Right 02/28/2021   Procedure: INSERTION PORT-A-CATH;  Surgeon: Coralie Keens, MD;  Location: [place];  Service: General;  Laterality: Right;   RADIOACTIVE SEED GUIDED PARTIAL MASTECTOMY/AXILLARY SENTINEL NODE BIOPSY/AXILLARY NODE DISSECTION Left 08/21/2021   Procedure: LEFT BREAST RADIOACTIVE SEED GUIDED  LUMPECTOMY AND COMPLETE LEFT AXILLARY LYMPH NODE DISSECTION;  Surgeon: Coralie Keens, MD;  Location: [place];  Service: General;  Laterality: Left;  LMA PECTORAL BLOCK    SOCIAL HISTORY: Social History   Socioeconomic History   Marital status: Divorced    Spouse name: Not on file   Number of children: 3   Years of education: Not on file   Highest education level: Not on file  Occupational History   Occupation: nurse    Comment: mental health  Tobacco Use   Smoking status: Never   Smokeless tobacco: Never  Vaping Use   Vaping Use: Never used  Substance and Sexual Activity   Alcohol use: Yes    Alcohol/week: 5.0 standard drinks of alcohol    Types: 1 Glasses of wine, 1 Cans of beer, 1 Shots of liquor, 2 Standard drinks or equivalent per week    Comment: Occasional   Drug use: No   Sexual activity: Yes  Other Topics Concern   Not on file  Social History Narrative   Lives with her youngest child.   Social Determinants of Health   Financial Resource Strain: Medium Risk (02/13/2021)   Overall Financial Resource Strain (CARDIA)    Difficulty of Paying Living Expenses: Somewhat hard  Food Insecurity: No Food Insecurity (02/13/2021)   Hunger Vital Sign    Worried About Running Out of Food in the Last Year: Never  true    Ran Out of Food in the Last Year: Never true  Transportation Needs: No Transportation Needs (02/13/2021)   PRAPARE - Hydrologist (Medical): No    Lack of Transportation (Non-Medical): No  Physical Activity: Not on file  Stress: Not on file  Social Connections: Not on file  Intimate Partner Violence: Not on file    FAMILY HISTORY: Family History  Problem Relation Age of Onset   Diabetes Mother    Hypertension Mother    Thyroid disease Mother    Diabetes Father    Heart disease Father        4V CABG 03/2016, age 30   Hyperlipidemia Father    Hypertension Father    Hypothyroidism Father    Diabetes  Sister    Hypertension Sister    Hypertension Maternal Grandmother    Colon cancer Maternal Grandmother        dx. >50   Hypertension Maternal Grandfather    Hypertension Paternal Grandmother    Kidney disease Paternal Grandmother    Hypertension Paternal Grandfather    Lung cancer Paternal Grandfather    Breast cancer Maternal Aunt        dx. 40s    Review of Systems  Constitutional:  Negative for appetite change, chills, fatigue, fever and unexpected weight change.  HENT:   Negative for hearing loss, lump/mass and trouble swallowing.   Eyes:  Negative for eye problems and icterus.  Respiratory:  Negative for chest tightness, cough and shortness of breath.   Cardiovascular:  Negative for chest pain, leg swelling and palpitations.  Gastrointestinal:  Negative for abdominal distention, abdominal pain, constipation, diarrhea, nausea and vomiting.  Endocrine: Negative for hot flashes.  Genitourinary:  Negative for difficulty urinating.   Musculoskeletal:  Negative for arthralgias.  Skin:  Negative for itching and rash.  Neurological:  Negative for dizziness, extremity weakness, headaches and numbness.  Hematological:  Negative for adenopathy. Does not bruise/bleed easily.  Psychiatric/Behavioral:  Negative for depression. The patient is not nervous/anxious.       PHYSICAL EXAMINATION  ECOG PERFORMANCE STATUS: {CHL ONC ECOG [phone]}  There were no vitals filed for this visit.  Physical Exam Constitutional:      General: She is not in acute distress.    Appearance: Normal appearance. She is not toxic-appearing.  HENT:     Head: Normocephalic and atraumatic.  Eyes:     General: No scleral icterus. Cardiovascular:     Rate and Rhythm: Normal rate and regular rhythm.     Pulses: Normal pulses.     Heart sounds: Normal heart sounds.  Pulmonary:     Effort: Pulmonary effort is normal.     Breath sounds: Normal breath sounds.  Abdominal:     General: Abdomen is flat.  Bowel sounds are normal. There is no distension.     Palpations: Abdomen is soft.     Tenderness: There is no abdominal tenderness.  Musculoskeletal:        General: No swelling.     Cervical back: Neck supple.  Lymphadenopathy:     Cervical: No cervical adenopathy.  Skin:    General: Skin is warm and dry.     Findings: No rash.  Neurological:     General: No focal deficit present.     Mental Status: She is alert.  Psychiatric:        Mood and Affect: Mood normal.        Behavior: Behavior normal.  LABORATORY DATA:  CBC    Component Value Date/Time   WBC 5.1 10/30/2021 0949   RBC 4.83 10/30/2021 0949   HGB 11.5 (L) 10/30/2021 0949   HGB 9.5 (L) 06/30/2019 1551   HCT 37.0 10/30/2021 0949   HCT 30.8 (L) 06/30/2019 1551   PLT 326 10/30/2021 0949   PLT 389 06/30/2019 1551   MCV 76.6 (L) 10/30/2021 0949   MCV 72 (L) 06/30/2019 1551   MCH 23.8 (L) 10/30/2021 0949   MCHC 31.1 10/30/2021 0949   RDW 15.8 (H) 10/30/2021 0949   RDW 16.8 (H) 06/30/2019 1551   LYMPHSABS 0.9 10/30/2021 0949   LYMPHSABS 3.4 (H) 06/30/2019 1551   MONOABS 0.4 10/30/2021 0949   EOSABS 0.1 10/30/2021 0949   EOSABS 0.1 06/30/2019 1551   BASOSABS 0.0 10/30/2021 0949   BASOSABS 0.1 06/30/2019 1551    CMP     Component Value Date/Time   NA 138 10/30/2021 0949   NA 138 08/20/2016 1712   K 3.9 10/30/2021 0949   CL 104 10/30/2021 0949   CO2 26 10/30/2021 0949   GLUCOSE 154 (H) 10/30/2021 0949   BUN 12 10/30/2021 0949   BUN 11 08/20/2016 1712   CREATININE 0.87 10/30/2021 0949   CALCIUM 10.2 10/30/2021 0949   PROT 8.0 10/30/2021 0949   PROT 6.9 08/20/2016 1712   ALBUMIN 4.3 10/30/2021 0949   ALBUMIN 4.0 08/20/2016 1712   AST 21 10/30/2021 0949   ALT 16 10/30/2021 0949   ALKPHOS 126 10/30/2021 0949   BILITOT 0.4 10/30/2021 0949   GFRNONAA >60 10/30/2021 0949   GFRAA 88 08/20/2016 1712       PENDING LABS:   RADIOGRAPHIC STUDIES:  No results  found.   PATHOLOGY:     ASSESSMENT and THERAPY PLAN:   No problem-specific Assessment & Plan notes found for this encounter.   No orders of the defined types were placed in this encounter.   All questions were answered. The patient knows to call the clinic with any problems, questions or concerns. We can certainly see the patient much sooner if necessary. This note was electronically signed. Scot Dock, NP 12/09/2021

## 2021-12-09 NOTE — Telephone Encounter (Signed)
Pt scheduled with Dr Alessandra Bevels at [place] regarding abd pain and eval of most recent abd [place]. Pt was offered to still keep appt with [facility] if needed but she states she can wait until tomorrow. She is aware of time and address. Referral, most recent office note, [place] results and demo info faxed to Dr Leanna Sato office at  [phone]. Fax confirmation received.

## 2021-12-09 NOTE — Telephone Encounter (Signed)
Received after hours message from pt with complaint of severe abdominal pain radiating to her back.  RN placed call to pt for f/u.  Pt states pain is no longer severe and is currently 7/10 abdominal pain radiating to her back.  Per MD pt needing have labs drawn and be seen with [facility] for further evaluation.  Appt scheduled, pt verbalized understanding of appt date and time.

## 2021-12-10 ENCOUNTER — Other Ambulatory Visit: Payer: Self-pay | Admitting: Gastroenterology

## 2021-12-10 DIAGNOSIS — R109 Unspecified abdominal pain: Secondary | ICD-10-CM

## 2021-12-11 ENCOUNTER — Ambulatory Visit: Payer: Managed Care, Other (non HMO) | Admitting: Radiation Oncology

## 2021-12-12 ENCOUNTER — Telehealth: Payer: Self-pay

## 2021-12-12 NOTE — Telephone Encounter (Signed)
I called the patient today about her upcoming follow-up appointment in radiation oncology.   Given the state of the COVID-19 pandemic, concerning case numbers in our community, and guidance from [facility], I offered a phone assessment with the patient to determine if coming to the clinic was necessary. She accepted.  The patient denies any symptomatic concerns other than lingering fatigue.  She continues to experience occasional stings/pains to her breast, but reports they resolve quickly. She reports her axilla has mild swelling and she still has limited range of motion to her left shoulder. She states Dr. Lindi Adie has re-referred her to PT, and she is working on getting a schedule set up. She reports dry peeling to her skin in the radiation field. I recommended that she finish her tube of radiaplex, and then continue skin care by applying oil or lotion with vitamin E to the skin in the radiation fields, BID, for 2 more months.    Continue follow-up with medical oncology. She had F/U with Annabelle Harman on 11/22/21 and will see her again on 02/03/22. I explained that yearly mammograms are important for patients with intact breast tissue, and physical exams are important after mastectomy for patients that cannot undergo mammography.  I encouraged her to call if she had further questions or concerns about her healing. Otherwise, she will follow-up PRN in radiation oncology. Patient is pleased with this plan, and we will cancel her upcoming follow-up to reduce the risk of COVID-19 transmission.

## 2021-12-23 ENCOUNTER — Encounter: Payer: Self-pay | Admitting: Hematology and Oncology

## 2021-12-24 ENCOUNTER — Ambulatory Visit
Admission: RE | Admit: 2021-12-24 | Discharge: 2021-12-24 | Disposition: A | Discharge status: Home / Self Care | Payer: BC Managed Care – PPO | Source: Ambulatory Visit | Attending: Gastroenterology | Admitting: Gastroenterology

## 2021-12-24 DIAGNOSIS — R109 Unspecified abdominal pain: Secondary | ICD-10-CM

## 2021-12-24 MED ORDER — IOPAMIDOL (ISOVUE-300) INJECTION 61%
100.0000 mL | Freq: Once | INTRAVENOUS | Status: AC | PRN
  Administered 2021-12-24: 100 mL via INTRAVENOUS

## 2022-01-06 ENCOUNTER — Ambulatory Visit: Payer: Managed Care, Other (non HMO) | Admitting: Gastroenterology

## 2022-01-16 ENCOUNTER — Other Ambulatory Visit: Payer: Self-pay | Admitting: Hematology and Oncology

## 2022-01-16 NOTE — Telephone Encounter (Signed)
Can you please send this refill. Gabapentin is a controlled substance in [place] and I can not send. Thanks!

## 2022-01-31 ENCOUNTER — Inpatient Hospital Stay: Payer: Managed Care, Other (non HMO) | Attending: Oncology

## 2022-02-02 NOTE — Progress Notes (Deleted)
[facility] Follow up:    Catherine Mola, NP No address on file   DIAGNOSIS: Cancer Staging  Malignant neoplasm of upper-outer quadrant of left breast in female, estrogen receptor positive ([place]) Staging form: Breast, AJCC 8th Edition - Clinical stage from 02/13/2021: Stage IIIA (cT2, cN1, cM0, G3, ER+, PR-, HER2-) - Signed by [name], MD on 02/13/2021 Stage prefix: Initial diagnosis Histologic grading system: 3 grade system Laterality: Left Staged by: Pathologist and managing physician Stage used in treatment planning: Yes National guidelines used in treatment planning: Yes Type of national guideline used in treatment planning: NCCN - Pathologic stage from 08/21/2021: pT0, pN0, cM0 - Signed by [name], NP on 10/30/2021 Stage prefix: Initial diagnosis   SUMMARY OF ONCOLOGIC HISTORY: Oncology History  Malignant neoplasm of upper-outer quadrant of left breast in female, estrogen receptor positive ([place])  02/11/2021 Initial Diagnosis   left breast upper outer quadrant biopsy 02/07/2021 for a clinical T2 Ni, stage IIIA invasive ductal carcinoma, grade 3, ER 70% weak, PR 0% and HER2 negative, Ki-67: 40%   02/13/2021 Cancer Staging   Staging form: Breast, AJCC 8th Edition - Clinical stage from 02/13/2021: Stage IIIA (cT2, cN1, cM0, G3, ER+, PR-, HER2-) - Signed by [name], MD on 02/13/2021 Stage prefix: Initial diagnosis Histologic grading system: 3 grade system Laterality: Left Staged by: Pathologist and managing physician Stage used in treatment planning: Yes National guidelines used in treatment planning: Yes Type of national guideline used in treatment planning: NCCN   02/19/2021 Genetic Testing   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    Genes tested: APC, ATM, AXIN2, BARD1, BMPR1A,  BRCA1, BRCA2, BRIP1, CDH1, CDK4, CDKN2A, CHEK2, DICER1, EPCAM, GREM1, HOXB13, MEN1, MLH1, MSH2, MSH3, MSH6, MUTYH, NBN, NF1, NF2, NTHL1, PALB2, PMS2, POLD1, POLE, PTEN, RAD51C, RAD51D, RECQL, RET, SDHA, SDHAF2, SDHB, SDHC, SDHD, SMAD4, SMARCA4, STK11, TP53, TSC1, TSC2, and VHL.  RNA data is routinely analyzed for use in variant interpretation for all genes.  Recommendations:     Management Recommendations for individuals with a single MUTYH pathogenic variant:  Changes in the MUTYH gene are associated with MUTYH-associated Polyposis syndrome (MAP). MAP is a hereditary cancer condition in which patients have high risks for colorectal cancer due to a large number of adenomatous polyps in the gastrointestinal system. MAP is unique among the hereditary cancer syndromes in that it is a "recessive" condition. Most hereditary cancer conditions are "dominant", meaning the condition is present in patients with a mutation in one copy of the gene. Patients have MAP only if there are mutations in both of their copies of the MUTYH gene. 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    Colon Cancer Screening: If an individual has a first-degree relative with colorectal cancer, screening should begin at age 82 or 77 years prior to the relative's age at diagnosis, whichever comes first and repeat colonoscopies every 5 years.   If an individual has no first-degree relatives with colorectal cancer, data is unclear if specialized screening is warranted. We advise these patients to follow their gastroenterologists recommendations.   03/01/2021 -  Neo-Adjuvant Chemotherapy   neoadjuvant chemotherapy will consist of paclitaxel weekly x12 followed by dose dense doxorubicin and cyclophosphamide x4   08/21/2021 Surgery   Left lumpectomy:  Negative for residual cancer.  Margins negative, 0/14 lymph nodes negative   08/21/2021 Cancer Staging    Staging form: Breast, AJCC 8th Edition - Pathologic stage from 08/21/2021: pT0, pN0, cM0 - Signed by [name], NP on 10/30/2021 Stage prefix: Initial diagnosis   09/30/2021 - 11/07/2021 Radiation Therapy   Adjuvant radiation   10/2021 -  Anti-estrogen oral therapy   Anastrozole daily     CURRENT THERAPY:  INTERVAL HISTORY: Catherine Ellison 46 y.o. female returns for    Patient Active Problem List   Diagnosis Date Noted   S/P lumpectomy, left breast 08/21/2021   Port-A-Cath in place 50/56/9794   Monoallelic mutation of MUTYH gene 03/04/2021   Genetic testing 02/20/2021   Family history of breast cancer 02/13/2021   Family history of colon cancer 02/13/2021   Malignant neoplasm of upper-outer quadrant of left breast in female, estrogen receptor positive ([place]) 02/11/2021   Cervical cancer screening 06/30/2019   Menorrhagia with regular cycle 06/30/2019   Vaginal bleeding 06/30/2019   Adjustment disorder with mixed anxiety and depressed mood 11/06/2016    is allergic to latex.  MEDICAL HISTORY: Past Medical History:  Diagnosis Date   Allergy    seasonal    Anemia    Dyspnea    with heavy exertion   GERD (gastroesophageal reflux disease)    History of gestational diabetes 2005   Hyperemesis gravidarum    with all pregnancies   IBS (irritable bowel syndrome)    left breast cancer    Peptic ulcer    Pneumonia     SURGICAL HISTORY: Past Surgical History:  Procedure Laterality Date   BILATERAL SALPINGECTOMY Bilateral 2014   emergency   IR CV LINE INJECTION  05/29/2021   LEFT OOPHORECTOMY Left 2006   PORTACATH PLACEMENT Right 02/28/2021   Procedure: INSERTION PORT-A-CATH;  Surgeon: Coralie Keens, MD;  Location: [facility];  Service: General;  Laterality: Right;   RADIOACTIVE SEED GUIDED PARTIAL MASTECTOMY/AXILLARY SENTINEL NODE BIOPSY/AXILLARY NODE DISSECTION Left 08/21/2021   Procedure: LEFT BREAST RADIOACTIVE SEED GUIDED LUMPECTOMY  AND COMPLETE LEFT AXILLARY LYMPH NODE DISSECTION;  Surgeon: Coralie Keens, MD;  Location: [facility];  Service: General;  Laterality: Left;  LMA PECTORAL BLOCK    SOCIAL HISTORY: Social History   Socioeconomic History   Marital status: Divorced    Spouse name: Not on file   Number of children: 3   Years of education: Not on file   Highest education level: Not on file  Occupational History   Occupation: nurse    Comment: mental health  Tobacco Use   Smoking status: Never   Smokeless tobacco: Never  Vaping Use   Vaping Use: Never used  Substance and Sexual Activity   Alcohol use: Yes    Alcohol/week: 5.0 standard drinks of alcohol    Types: 1 Glasses of wine, 1 Cans of beer, 1 Shots of liquor, 2 Standard drinks or equivalent per week    Comment: Occasional   Drug use: No   Sexual activity: Yes  Other Topics Concern   Not on file  Social History Narrative   Lives with her youngest child.   Social Determinants of Health   Financial Resource Strain: Medium Risk (02/13/2021)   Overall Financial Resource Strain (CARDIA)    Difficulty of Paying Living Expenses: Somewhat hard  Food Insecurity: No Food Insecurity (02/13/2021)   Hunger Vital Sign    Worried About Running Out of Food in the Last Year: Never true  Ran Out of Food in the Last Year: Never true  Transportation Needs: No Transportation Needs (02/13/2021)   PRAPARE - Hydrologist (Medical): No    Lack of Transportation (Non-Medical): No  Physical Activity: Not on file  Stress: Not on file  Social Connections: Not on file  Intimate Partner Violence: Not on file    FAMILY HISTORY: Family History  Problem Relation Age of Onset   Diabetes Mother    Hypertension Mother    Thyroid disease Mother    Diabetes Father    Heart disease Father        4V CABG 03/2016, age 59   Hyperlipidemia Father    Hypertension Father    Hypothyroidism Father    Diabetes Sister     Hypertension Sister    Hypertension Maternal Grandmother    Colon cancer Maternal Grandmother        dx. >50   Hypertension Maternal Grandfather    Hypertension Paternal Grandmother    Kidney disease Paternal Grandmother    Hypertension Paternal Grandfather    Lung cancer Paternal Grandfather    Breast cancer Maternal Aunt        dx. 40s    Review of Systems - Oncology    PHYSICAL EXAMINATION  ECOG PERFORMANCE STATUS: {CHL ONC ECOG PS:[id number]}  There were no vitals filed for this visit.  Physical Exam  LABORATORY DATA:  CBC    Component Value Date/Time   WBC 5.1 10/30/2021 0949   RBC 4.83 10/30/2021 0949   HGB 11.5 (L) 10/30/2021 0949   HGB 9.5 (L) 06/30/2019 1551   HCT 37.0 10/30/2021 0949   HCT 30.8 (L) 06/30/2019 1551   PLT 326 10/30/2021 0949   PLT 389 06/30/2019 1551   MCV 76.6 (L) 10/30/2021 0949   MCV 72 (L) 06/30/2019 1551   MCH 23.8 (L) 10/30/2021 0949   MCHC 31.1 10/30/2021 0949   RDW 15.8 (H) 10/30/2021 0949   RDW 16.8 (H) 06/30/2019 1551   LYMPHSABS 0.9 10/30/2021 0949   LYMPHSABS 3.4 (H) 06/30/2019 1551   MONOABS 0.4 10/30/2021 0949   EOSABS 0.1 10/30/2021 0949   EOSABS 0.1 06/30/2019 1551   BASOSABS 0.0 10/30/2021 0949   BASOSABS 0.1 06/30/2019 1551    CMP     Component Value Date/Time   NA 138 10/30/2021 0949   NA 138 08/20/2016 1712   K 3.9 10/30/2021 0949   CL 104 10/30/2021 0949   CO2 26 10/30/2021 0949   GLUCOSE 154 (H) 10/30/2021 0949   BUN 12 10/30/2021 0949   BUN 11 08/20/2016 1712   CREATININE 0.87 10/30/2021 0949   CALCIUM 10.2 10/30/2021 0949   PROT 8.0 10/30/2021 0949   PROT 6.9 08/20/2016 1712   ALBUMIN 4.3 10/30/2021 0949   ALBUMIN 4.0 08/20/2016 1712   AST 21 10/30/2021 0949   ALT 16 10/30/2021 0949   ALKPHOS 126 10/30/2021 0949   BILITOT 0.4 10/30/2021 0949   GFRNONAA >60 10/30/2021 0949   GFRAA 88 08/20/2016 1712       PENDING LABS:   RADIOGRAPHIC STUDIES:  No results  found.   PATHOLOGY:     ASSESSMENT and THERAPY PLAN:   No problem-specific Assessment & Plan notes found for this encounter.   No orders of the defined types were placed in this encounter.   All questions were answered. The patient knows to call the clinic with any problems, questions or concerns. We can certainly see the patient much sooner if necessary.  This note was electronically signed. Scot Dock, NP 02/02/2022

## 2022-02-03 ENCOUNTER — Telehealth: Payer: Self-pay

## 2022-02-03 ENCOUNTER — Inpatient Hospital Stay: Payer: Managed Care, Other (non HMO) | Admitting: Adult Health

## 2022-02-03 NOTE — Telephone Encounter (Signed)
Attempted to call patient on primary number regarding today's appointment with Wilber Bihari, NP. No answer. Left voicemail providing patient with phone number to call to inform us if patient is on the way or to reschedule appointments.   Provider informed.

## 2022-02-14 ENCOUNTER — Other Ambulatory Visit: Payer: Self-pay | Admitting: Adult Health

## 2022-04-04 ENCOUNTER — Telehealth: Payer: Self-pay | Admitting: Adult Health

## 2022-04-04 NOTE — Telephone Encounter (Signed)
Called patient per 11/16 in basket. Patient notified of new appointment time

## 2022-04-22 ENCOUNTER — Telehealth: Payer: Self-pay

## 2022-04-22 ENCOUNTER — Inpatient Hospital Stay: Payer: BC Managed Care – PPO | Attending: Oncology

## 2022-04-22 ENCOUNTER — Encounter: Payer: Self-pay | Admitting: Adult Health

## 2022-04-22 ENCOUNTER — Inpatient Hospital Stay (HOSPITAL_BASED_OUTPATIENT_CLINIC_OR_DEPARTMENT_OTHER): Payer: BC Managed Care – PPO | Admitting: Adult Health

## 2022-04-22 VITALS — BP 135/94 | HR 96 | Temp 97.8°F | Resp 16 | Ht 71.0 in | Wt 202.4 lb

## 2022-04-22 DIAGNOSIS — Z9221 Personal history of antineoplastic chemotherapy: Secondary | ICD-10-CM | POA: Insufficient documentation

## 2022-04-22 DIAGNOSIS — E2839 Other primary ovarian failure: Secondary | ICD-10-CM

## 2022-04-22 DIAGNOSIS — E559 Vitamin D deficiency, unspecified: Secondary | ICD-10-CM

## 2022-04-22 DIAGNOSIS — D509 Iron deficiency anemia, unspecified: Secondary | ICD-10-CM | POA: Diagnosis present

## 2022-04-22 DIAGNOSIS — Z923 Personal history of irradiation: Secondary | ICD-10-CM | POA: Diagnosis not present

## 2022-04-22 DIAGNOSIS — N6001 Solitary cyst of right breast: Secondary | ICD-10-CM | POA: Diagnosis not present

## 2022-04-22 DIAGNOSIS — Z170 Estrogen receptor positive status [ER+]: Secondary | ICD-10-CM

## 2022-04-22 DIAGNOSIS — C50412 Malignant neoplasm of upper-outer quadrant of left female breast: Secondary | ICD-10-CM

## 2022-04-22 DIAGNOSIS — Z79811 Long term (current) use of aromatase inhibitors: Secondary | ICD-10-CM | POA: Insufficient documentation

## 2022-04-22 DIAGNOSIS — D508 Other iron deficiency anemias: Secondary | ICD-10-CM

## 2022-04-22 DIAGNOSIS — R748 Abnormal levels of other serum enzymes: Secondary | ICD-10-CM | POA: Insufficient documentation

## 2022-04-22 LAB — CBC WITH DIFFERENTIAL (CANCER CENTER ONLY)
Abs Immature Granulocytes: 0.01 10*3/uL (ref 0.00–0.07)
Basophils Absolute: 0 10*3/uL (ref 0.0–0.1)
Basophils Relative: 0 %
Eosinophils Absolute: 0.1 10*3/uL (ref 0.0–0.5)
Eosinophils Relative: 1 %
HCT: 42.9 % (ref 36.0–46.0)
Hemoglobin: 15.1 g/dL — ABNORMAL HIGH (ref 12.0–15.0)
Immature Granulocytes: 0 %
Lymphocytes Relative: 25 %
Lymphs Abs: 2 10*3/uL (ref 0.7–4.0)
MCH: 33.6 pg (ref 26.0–34.0)
MCHC: 35.2 g/dL (ref 30.0–36.0)
MCV: 95.3 fL (ref 80.0–100.0)
Monocytes Absolute: 0.5 10*3/uL (ref 0.1–1.0)
Monocytes Relative: 6 %
Neutro Abs: 5.2 10*3/uL (ref 1.7–7.7)
Neutrophils Relative %: 68 %
Platelet Count: 299 10*3/uL (ref 150–400)
RBC: 4.5 MIL/uL (ref 3.87–5.11)
RDW: 12.5 % (ref 11.5–15.5)
WBC Count: 7.8 10*3/uL (ref 4.0–10.5)
nRBC: 0 % (ref 0.0–0.2)

## 2022-04-22 LAB — CMP (CANCER CENTER ONLY)
ALT: 20 U/L (ref 0–44)
AST: 21 U/L (ref 15–41)
Albumin: 4.4 g/dL (ref 3.5–5.0)
Alkaline Phosphatase: 139 U/L — ABNORMAL HIGH (ref 38–126)
Anion gap: 7 (ref 5–15)
BUN: 13 mg/dL (ref 6–20)
CO2: 27 mmol/L (ref 22–32)
Calcium: 10.2 mg/dL (ref 8.9–10.3)
Chloride: 104 mmol/L (ref 98–111)
Creatinine: 0.88 mg/dL (ref 0.44–1.00)
GFR, Estimated: >60 mL/min (ref >60)
Glucose, Bld: 100 mg/dL — ABNORMAL HIGH (ref 70–99)
Potassium: 3.6 mmol/L (ref 3.5–5.1)
Sodium: 138 mmol/L (ref 135–145)
Total Bilirubin: 0.7 mg/dL (ref 0.3–1.2)
Total Protein: 7.7 g/dL (ref 6.5–8.1)

## 2022-04-22 LAB — IRON AND IRON BINDING CAPACITY (CC-WL,HP ONLY)
Iron: 105 ug/dL (ref 28–170)
Saturation Ratios: 28 % (ref 10.4–31.8)
TIBC: 378 ug/dL (ref 250–450)
UIBC: 273 ug/dL (ref 148–442)

## 2022-04-22 LAB — FERRITIN: Ferritin: 119 ng/mL (ref 11–307)

## 2022-04-22 LAB — VITAMIN D 25 HYDROXY (VIT D DEFICIENCY, FRACTURES): Vit D, 25-Hydroxy: 61.47 ng/mL (ref 30–100)

## 2022-04-22 NOTE — Progress Notes (Unsigned)
Catherine Ellison Follow up:    Catherine Mola, NP No address on file   DIAGNOSIS:  Ellison Staging  Malignant neoplasm of upper-outer quadrant of left breast in female, estrogen receptor positive ([place]) Staging form: Breast, AJCC 8th Edition - Clinical stage from 02/13/2021: Stage IIIA (cT2, cN1, cM0, G3, ER+, PR-, HER2-) - Signed by [name], MD on 02/13/2021 Stage prefix: Initial diagnosis Histologic grading system: 3 grade system Laterality: Left Staged by: Pathologist and managing physician Stage used in treatment planning: Yes National guidelines used in treatment planning: Yes Type of national guideline used in treatment planning: NCCN - Pathologic stage from 08/21/2021: pT0, pN0, cM0 - Signed by [name], NP on 10/30/2021 Stage prefix: Initial diagnosis   SUMMARY OF ONCOLOGIC HISTORY: Oncology History  Malignant neoplasm of upper-outer quadrant of left breast in female, estrogen receptor positive ([place])  02/11/2021 Initial Diagnosis   left breast upper outer quadrant biopsy 02/07/2021 for a clinical T2 Ni, stage IIIA invasive ductal carcinoma, grade 3, ER 70% weak, PR 0% and HER2 negative, Ki-67: 40%   02/13/2021 Ellison Staging   Staging form: Breast, AJCC 8th Edition - Clinical stage from 02/13/2021: Stage IIIA (cT2, cN1, cM0, G3, ER+, PR-, HER2-) - Signed by [name], MD on 02/13/2021 Stage prefix: Initial diagnosis Histologic grading system: 3 grade system Laterality: Left Staged by: Pathologist and managing physician Stage used in treatment planning: Yes National guidelines used in treatment planning: Yes Type of national guideline used in treatment planning: NCCN   02/19/2021 Genetic Testing   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    Genes tested: APC, ATM, AXIN2, BARD1, BMPR1A,  BRCA1, BRCA2, BRIP1, CDH1, CDK4, CDKN2A, CHEK2, DICER1, EPCAM, GREM1, HOXB13, MEN1, MLH1, MSH2, MSH3, MSH6, MUTYH, NBN, NF1, NF2, NTHL1, PALB2, PMS2, POLD1, POLE, PTEN, RAD51C, RAD51D, RECQL, RET, SDHA, SDHAF2, SDHB, SDHC, SDHD, SMAD4, SMARCA4, STK11, TP53, TSC1, TSC2, and VHL.  RNA data is routinely analyzed for use in variant interpretation for all genes.  Recommendations:     Management Recommendations for individuals with a single MUTYH pathogenic variant:  Changes in the MUTYH gene are associated with MUTYH-associated Polyposis syndrome (MAP). MAP is a hereditary Ellison condition in which patients have high risks for colorectal Ellison due to a large number of adenomatous polyps in the gastrointestinal system. MAP is unique among the hereditary Ellison syndromes in that it is a "recessive" condition. Most hereditary Ellison conditions are "dominant", meaning the condition is present in patients with a mutation in one copy of the gene. Patients have MAP only if there are mutations in both of their copies of the MUTYH gene. 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    Colon Ellison Screening: If an individual has a first-degree relative with colorectal Ellison, screening should begin at age 33 or 43 years prior to the relative's age at diagnosis, whichever comes first and repeat colonoscopies every 5 years.   If an individual has no first-degree relatives with colorectal Ellison, data is unclear if specialized screening is warranted. We advise these patients to follow their gastroenterologists recommendations.   03/01/2021 -  Neo-Adjuvant Chemotherapy   neoadjuvant chemotherapy will consist of paclitaxel weekly x12 followed by dose dense doxorubicin and cyclophosphamide x4   08/21/2021 Surgery   Left  lumpectomy: Negative for residual Ellison.  Margins negative, 0/14 lymph nodes negative   08/21/2021 Ellison Staging    Staging form: Breast, AJCC 8th Edition - Pathologic stage from 08/21/2021: pT0, pN0, cM0 - Signed by [name], NP on 10/30/2021 Stage prefix: Initial diagnosis   09/30/2021 - 11/07/2021 Radiation Therapy   Adjuvant radiation   10/2021 -  Anti-estrogen oral therapy   Anastrozole daily     CURRENT THERAPY: Anastrozole  INTERVAL HISTORY: Catherine Ellison 46 y.o. female returns for follow-up of her history of left-sided breast Ellison.  She continues to recover from her treatment well.  She has not yet undergone posttreatment mammography.  She has an area in her right breast that remains stable on previous MRI it was noted as a simple cyst.  She tells me that sometimes it does bother her and she wonders if it could be drained.  She is taking anastrozole daily with good tolerance other than some hot flashes.  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  She has continued to have GI issues.  She is seen GI at [facility] and underwent colonoscopy and upper endoscopy in September that was normal and biopsy was neg for H Pylori.  She tells me she is supposed to follow-up with GI but that has not yet been scheduled.  She is taking Protonix daily.   Patient Active Problem List   Diagnosis Date Noted   S/P lumpectomy, left breast 08/21/2021   Port-A-Cath in place 65/78/4696   Monoallelic mutation of MUTYH gene 03/04/2021   Genetic testing 02/20/2021   Family history of breast Ellison 02/13/2021   Family history of colon Ellison 02/13/2021   Malignant neoplasm of upper-outer quadrant of left breast in female, estrogen receptor positive ([place]) 02/11/2021   Cervical Ellison screening 06/30/2019   Menorrhagia with regular cycle 06/30/2019   Vaginal bleeding 06/30/2019   Adjustment disorder with mixed anxiety and depressed mood 11/06/2016    is allergic to latex.  MEDICAL HISTORY: Past Medical History:  Diagnosis Date    Allergy    seasonal    Anemia    Dyspnea    with heavy exertion   GERD (gastroesophageal reflux disease)    History of gestational diabetes 2005   Hyperemesis gravidarum    with all pregnancies   IBS (irritable bowel syndrome)    left breast Ellison    Peptic ulcer    Pneumonia     SURGICAL HISTORY: Past Surgical History:  Procedure Laterality Date   BILATERAL SALPINGECTOMY Bilateral 2014   emergency   IR CV LINE INJECTION  05/29/2021   LEFT OOPHORECTOMY Left 2006   PORTACATH PLACEMENT Right 02/28/2021   Procedure: INSERTION PORT-A-CATH;  Surgeon: Coralie Keens, MD;  Location: [facility];  Service: General;  Laterality: Right;   RADIOACTIVE SEED GUIDED PARTIAL MASTECTOMY/AXILLARY SENTINEL NODE BIOPSY/AXILLARY NODE DISSECTION Left 08/21/2021   Procedure: LEFT BREAST RADIOACTIVE SEED GUIDED LUMPECTOMY AND COMPLETE LEFT AXILLARY LYMPH NODE DISSECTION;  Surgeon: Coralie Keens, MD;  Location: [facility];  Service: General;  Laterality: Left;  LMA PECTORAL BLOCK    SOCIAL HISTORY: Social History   Socioeconomic History   Marital status: Divorced    Spouse name: Not on file   Number of children: 3   Years of education: Not on file   Highest education level: Not on file  Occupational History   Occupation: nurse    Comment: mental health  Tobacco Use   Smoking status: Never   Smokeless tobacco: Never  Vaping Use   Vaping Use: Never used  Substance and Sexual Activity   Alcohol use: Yes    Alcohol/week: 5.0 standard drinks of alcohol    Types: 1 Glasses of wine, 1 Cans of beer, 1 Shots of liquor, 2 Standard drinks or equivalent per week    Comment: Occasional   Drug use: No   Sexual activity: Yes  Other Topics Concern   Not on file  Social History Narrative   Lives with her youngest child.   Social Determinants of Health   Financial Resource Strain: Medium Risk (02/13/2021)   Overall Financial Resource Strain (CARDIA)    Difficulty  of Paying Living Expenses: Somewhat hard  Food Insecurity: No Food Insecurity (02/13/2021)   Hunger Vital Sign    Worried About Running Out of Food in the Last Year: Never true    Ran Out of Food in the Last Year: Never true  Transportation Needs: No Transportation Needs (02/13/2021)   PRAPARE - Hydrologist (Medical): No    Lack of Transportation (Non-Medical): No  Physical Activity: Not on file  Stress: Not on file  Social Connections: Not on file  Intimate Partner Violence: Not on file    FAMILY HISTORY: Family History  Problem Relation Age of Onset   Diabetes Mother    Hypertension Mother    Thyroid disease Mother    Diabetes Father    Heart disease Father        4V CABG 03/2016, age 67   Hyperlipidemia Father    Hypertension Father    Hypothyroidism Father    Diabetes Sister    Hypertension Sister    Hypertension Maternal Grandmother    Colon Ellison Maternal Grandmother        dx. >50   Hypertension Maternal Grandfather    Hypertension Paternal Grandmother    Kidney disease Paternal Grandmother    Hypertension Paternal Grandfather    Lung Ellison Paternal Grandfather    Breast Ellison Maternal Aunt        dx. 40s    Review of Systems  Constitutional:  Negative for appetite change, chills, fatigue, fever and unexpected weight change.  HENT:   Negative for hearing loss, lump/mass and trouble swallowing.   Eyes:  Negative for eye problems and icterus.  Respiratory:  Negative for chest tightness, cough and shortness of breath.   Cardiovascular:  Negative for chest pain, leg swelling and palpitations.  Gastrointestinal:  Negative for abdominal distention, abdominal pain, constipation, diarrhea, nausea and vomiting.  Endocrine: Positive for hot flashes.  Genitourinary:  Negative for difficulty urinating.   Musculoskeletal:  Negative for arthralgias.  Skin:  Negative for itching and rash.  Neurological:  Positive for numbness. Negative for  dizziness, extremity weakness and headaches.  Hematological:  Negative for adenopathy. Does not bruise/bleed easily.  Psychiatric/Behavioral:  Negative for depression. The patient is not nervous/anxious.       PHYSICAL EXAMINATION  ECOG PERFORMANCE STATUS: 1 - Symptomatic but completely ambulatory  Vitals:   04/22/22 1415  BP: (!) 135/94  Pulse: 96  Resp: 16  Temp: 97.8 F (36.6 C)  SpO2: 99%    Physical Exam Constitutional:      General: She is not in acute distress.    Appearance: Normal appearance. She is not toxic-appearing.  HENT:     Head: Normocephalic and atraumatic.  Eyes:     General: No scleral icterus.  Cardiovascular:     Rate and Rhythm: Normal rate and regular rhythm.     Pulses: Normal pulses.     Heart sounds: Normal heart sounds.  Pulmonary:     Effort: Pulmonary effort is normal.     Breath sounds: Normal breath sounds.  Chest:     Comments: Left breast s/p lumpectomy and radiation, no sign of local recurrence, right breast with 2.5cm cyst in the upper central breast, otherwise benign Abdominal:     General: Abdomen is flat. Bowel sounds are normal. There is no distension.     Palpations: Abdomen is soft.     Tenderness: There is no abdominal tenderness.  Musculoskeletal:        General: No swelling.     Cervical back: Neck supple.  Lymphadenopathy:     Cervical: No cervical adenopathy.  Skin:    General: Skin is warm and dry.     Findings: No rash.  Neurological:     General: No focal deficit present.     Mental Status: She is alert.  Psychiatric:        Mood and Affect: Mood normal.        Behavior: Behavior normal.     LABORATORY DATA:  CBC    Component Value Date/Time   WBC 7.8 04/22/2022 1402   RBC 4.50 04/22/2022 1402   HGB 15.1 (H) 04/22/2022 1402   HGB 9.5 (L) 06/30/2019 1551   HCT 42.9 04/22/2022 1402   HCT 30.8 (L) 06/30/2019 1551   PLT 299 04/22/2022 1402   PLT 389 06/30/2019 1551   MCV 95.3 04/22/2022 1402   MCV 72  (L) 06/30/2019 1551   MCH 33.6 04/22/2022 1402   MCHC 35.2 04/22/2022 1402   RDW 12.5 04/22/2022 1402   RDW 16.8 (H) 06/30/2019 1551   LYMPHSABS 2.0 04/22/2022 1402   LYMPHSABS 3.4 (H) 06/30/2019 1551   MONOABS 0.5 04/22/2022 1402   EOSABS 0.1 04/22/2022 1402   EOSABS 0.1 06/30/2019 1551   BASOSABS 0.0 04/22/2022 1402   BASOSABS 0.1 06/30/2019 1551    CMP     Component Value Date/Time   NA 138 04/22/2022 1402   NA 138 08/20/2016 1712   K 3.6 04/22/2022 1402   CL 104 04/22/2022 1402   CO2 27 04/22/2022 1402   GLUCOSE 100 (H) 04/22/2022 1402   BUN 13 04/22/2022 1402   BUN 11 08/20/2016 1712   CREATININE 0.88 04/22/2022 1402   CALCIUM 10.2 04/22/2022 1402   PROT 7.7 04/22/2022 1402   PROT 6.9 08/20/2016 1712   ALBUMIN 4.4 04/22/2022 1402   ALBUMIN 4.0 08/20/2016 1712   AST 21 04/22/2022 1402   ALT 20 04/22/2022 1402   ALKPHOS 139 (H) 04/22/2022 1402   BILITOT 0.7 04/22/2022 1402   GFRNONAA >60 04/22/2022 1402   GFRAA 88 08/20/2016 1712      ASSESSMENT and THERAPY PLAN:   Malignant neoplasm of upper-outer quadrant of left breast in female, estrogen receptor positive ([place]) Wynonia Lawman she is a 46 year old woman with history of stage IIIa weakly estrogen positive breast Ellison diagnosed in September 2022 status post neoadjuvant chemotherapy, left lumpectomy, adjuvant radiation, and antiestrogen therapy with anastrozole which began in June 2023.  Junius Creamer has no clinical or radiographic signs of breast Ellison recurrence.  She will continue on anastrozole daily which she is tolerating moderately well.  She is now 6 months post her adjuvant radiation and can proceed with diagnostic mammography which I have ordered.  She continues to have a  right breast cyst that I palpated on today's breast exam and since that is bothering her I did write an order for an ultrasound-guided aspiration of the cyst.  Her lab testing so far today is normal.  Her alk phos is slightly elevated.  The rest  of her labs are pending.  I will wait to see what her additional lab results are and determine when we will repeat labs to follow-up on the slightly elevated alk phos.  She continues to have GI issues and questions around this discomfort that she is experiencing.  I asked my nurse to reach out and obtain the GI consult notes and also to see if she can get Talisha back in with GI to discuss these questions in more detail.  Junius Creamer knows to call for any questions or concerns prior to her next appointment   All questions were answered. The patient knows to call the clinic with any problems, questions or concerns. We can certainly see the patient much sooner if necessary.  Total encounter time:30 minutes*in face-to-face visit time, chart review, lab review, care coordination, order entry, and documentation of the encounter time.    Wilber Bihari, NP 04/22/22 3:08 PM Medical Oncology and Hematology [facility] [place], [place] [address] Tel. [phone]    Fax. [phone]  *Total Encounter Time as defined by the Centers for Medicare and Medicaid Services includes, in addition to the face-to-face time of a patient visit (documented in the note above) non-face-to-face time: obtaining and reviewing outside history, ordering and reviewing medications, tests or procedures, care coordination (communications with other health care professionals or caregivers) and documentation in the medical record.

## 2022-04-22 NOTE — Telephone Encounter (Signed)
Contacted Dr Leanna Sato office at [phone] to obtain most recent OV note and find out when she is to be seen. Sherron at their office states pt is inactive as she needs to make a payment to schedule an appt.

## 2022-04-22 NOTE — Assessment & Plan Note (Signed)
Catherine Ellison she is a 46 year old woman with history of stage IIIa weakly estrogen positive breast cancer diagnosed in September 2022 status post neoadjuvant chemotherapy, left lumpectomy, adjuvant radiation, and antiestrogen therapy with anastrozole which began in June 2023.  Catherine Ellison has no clinical or radiographic signs of breast cancer recurrence.  She will continue on anastrozole daily which she is tolerating moderately well.  She is now 6 months post her adjuvant radiation and can proceed with diagnostic mammography which I have ordered.  She continues to have a right breast cyst that I palpated on today's breast exam and since that is bothering her I did write an order for an ultrasound-guided aspiration of the cyst.  Her lab testing so far today is normal.  Her alk phos is slightly elevated.  The rest of her labs are pending.  I will wait to see what her additional lab results are and determine when we will repeat labs to follow-up on the slightly elevated alk phos.  She continues to have GI issues and questions around this discomfort that she is experiencing.  I asked my nurse to reach out and obtain the GI consult notes and also to see if she can get Talisha back in with GI to discuss these questions in more detail.  Catherine Ellison knows to call for any questions or concerns prior to her next appointment

## 2022-04-23 ENCOUNTER — Encounter: Payer: Self-pay | Admitting: Hematology and Oncology

## 2022-04-23 ENCOUNTER — Telehealth: Payer: Self-pay

## 2022-04-23 NOTE — Telephone Encounter (Signed)
-----   Message from [name], NP sent at 04/23/2022  8:34 AM EST ----- Labs look GREAT!  Please let her know her iron studies have improved along with her Vitamin D.  I recommend that when she runs out of the prescription vitamin d,  that she transition to Vitamin D3, 1000 IU PO daily.  We will see her back in 3 months for labs and f/u.   ----- Message ----- From: Interface, Lab In [place] Sent: 04/22/2022   2:20 PM EST To: [name], NP

## 2022-04-23 NOTE — Telephone Encounter (Signed)
Per NP's request, called pt to discuss lab results and plan. Pt verbalized understanding.   Sent scheduling a message to schedule pt in 3 months for labs and f/u visit with Wilber Bihari, NP.

## 2022-04-24 ENCOUNTER — Encounter: Payer: Self-pay | Admitting: Hematology and Oncology

## 2022-04-25 ENCOUNTER — Encounter: Payer: Self-pay | Admitting: Hematology and Oncology

## 2022-04-30 ENCOUNTER — Telehealth: Payer: Self-pay | Admitting: Adult Health

## 2022-04-30 NOTE — Telephone Encounter (Signed)
Scheduled appointment per 12/5 staff message. Left message.

## 2022-05-08 ENCOUNTER — Telehealth: Payer: Self-pay

## 2022-05-08 ENCOUNTER — Encounter: Payer: Self-pay | Admitting: Adult Health

## 2022-05-08 NOTE — Telephone Encounter (Signed)
Returned Pts mychart message regarding sick sx. Pt states she started having bodyaches, headaches, and a sore throat on 12/17 but was negative for covid on home test. Pt saw PCP 12/20 for worsening sx and was covid positive at that time. PCP ordered antiviral and codeine/guaifenesin which Pt started taking same day. PCP stated Pt "looked dehydrated" and considered sending Pt to ED for IVF. Pt relayed that she would increase fluid intake as to avoid hospital. Pt reports to this RN that she has increased fluid intake with IV hydration packets added to water and is urinating frequently. Pt reports PCP was going to prescribe steroid but upon arrival to pharmacy there was none there. Pt states sx are slightly better than yesterday but would like to be seen if possible.  This RN will reach out to symptom management for available appts.

## 2022-05-08 NOTE — Telephone Encounter (Signed)
Per sx management RN, Pt can be seen if sx worsen r/t full schedule. Called Pt to reiterate the importance of hydration with water/broth/popsicles and keeping track of urination. Instructed Pt to call back if new sx appear or sx worsen in order to direct her to appropriate care. Pt verbalized understanding and was appreciative of call.

## 2022-05-15 ENCOUNTER — Other Ambulatory Visit: Payer: Self-pay | Admitting: Adult Health

## 2022-05-15 DIAGNOSIS — Z170 Estrogen receptor positive status [ER+]: Secondary | ICD-10-CM

## 2022-05-21 ENCOUNTER — Other Ambulatory Visit: Payer: BC Managed Care – PPO

## 2022-05-27 ENCOUNTER — Ambulatory Visit
Admission: RE | Admit: 2022-05-27 | Discharge: 2022-05-27 | Disposition: A | Discharge status: Home / Self Care | Payer: BC Managed Care – PPO | Source: Ambulatory Visit | Attending: Adult Health | Admitting: Adult Health

## 2022-05-27 ENCOUNTER — Other Ambulatory Visit: Payer: Self-pay | Admitting: Adult Health

## 2022-05-27 DIAGNOSIS — N6001 Solitary cyst of right breast: Secondary | ICD-10-CM

## 2022-05-27 DIAGNOSIS — C50412 Malignant neoplasm of upper-outer quadrant of left female breast: Secondary | ICD-10-CM

## 2022-05-27 DIAGNOSIS — Z170 Estrogen receptor positive status [ER+]: Secondary | ICD-10-CM

## 2022-05-27 HISTORY — DX: Personal history of irradiation: Z92.3

## 2022-05-27 HISTORY — DX: Personal history of antineoplastic chemotherapy: Z92.21

## 2022-06-20 ENCOUNTER — Other Ambulatory Visit: Payer: Self-pay | Admitting: Hematology and Oncology

## 2022-06-20 DIAGNOSIS — Z1589 Genetic susceptibility to other disease: Secondary | ICD-10-CM

## 2022-06-20 DIAGNOSIS — Z800 Family history of malignant neoplasm of digestive organs: Secondary | ICD-10-CM

## 2022-06-20 DIAGNOSIS — Z170 Estrogen receptor positive status [ER+]: Secondary | ICD-10-CM

## 2022-06-26 ENCOUNTER — Other Ambulatory Visit: Payer: Self-pay | Admitting: Gastroenterology

## 2022-06-26 DIAGNOSIS — K7689 Other specified diseases of liver: Secondary | ICD-10-CM

## 2022-07-07 ENCOUNTER — Ambulatory Visit
Admission: RE | Admit: 2022-07-07 | Discharge: 2022-07-07 | Disposition: A | Discharge status: Home / Self Care | Payer: BC Managed Care – PPO | Source: Ambulatory Visit | Attending: Gastroenterology | Admitting: Gastroenterology

## 2022-07-07 DIAGNOSIS — K7689 Other specified diseases of liver: Secondary | ICD-10-CM

## 2022-07-10 ENCOUNTER — Ambulatory Visit (HOSPITAL_BASED_OUTPATIENT_CLINIC_OR_DEPARTMENT_OTHER): Payer: BC Managed Care – PPO

## 2022-07-16 ENCOUNTER — Inpatient Hospital Stay (HOSPITAL_BASED_OUTPATIENT_CLINIC_OR_DEPARTMENT_OTHER): Admission: RE | Admit: 2022-07-16 | Payer: BC Managed Care – PPO | Source: Ambulatory Visit

## 2022-07-24 ENCOUNTER — Inpatient Hospital Stay (HOSPITAL_BASED_OUTPATIENT_CLINIC_OR_DEPARTMENT_OTHER): Payer: BC Managed Care – PPO | Admitting: Adult Health

## 2022-07-24 ENCOUNTER — Other Ambulatory Visit: Payer: Self-pay

## 2022-07-24 ENCOUNTER — Encounter: Payer: Self-pay | Admitting: Adult Health

## 2022-07-24 ENCOUNTER — Inpatient Hospital Stay: Payer: BC Managed Care – PPO | Attending: Oncology

## 2022-07-24 VITALS — BP 141/91 | HR 101 | Temp 98.1°F | Resp 20 | Wt 195.1 lb

## 2022-07-24 DIAGNOSIS — D508 Other iron deficiency anemias: Secondary | ICD-10-CM

## 2022-07-24 DIAGNOSIS — Z90721 Acquired absence of ovaries, unilateral: Secondary | ICD-10-CM | POA: Diagnosis not present

## 2022-07-24 DIAGNOSIS — Z841 Family history of disorders of kidney and ureter: Secondary | ICD-10-CM | POA: Insufficient documentation

## 2022-07-24 DIAGNOSIS — Z9079 Acquired absence of other genital organ(s): Secondary | ICD-10-CM | POA: Diagnosis not present

## 2022-07-24 DIAGNOSIS — Z8349 Family history of other endocrine, nutritional and metabolic diseases: Secondary | ICD-10-CM | POA: Insufficient documentation

## 2022-07-24 DIAGNOSIS — Z833 Family history of diabetes mellitus: Secondary | ICD-10-CM | POA: Insufficient documentation

## 2022-07-24 DIAGNOSIS — R232 Flushing: Secondary | ICD-10-CM | POA: Diagnosis not present

## 2022-07-24 DIAGNOSIS — K589 Irritable bowel syndrome without diarrhea: Secondary | ICD-10-CM | POA: Diagnosis not present

## 2022-07-24 DIAGNOSIS — D509 Iron deficiency anemia, unspecified: Secondary | ICD-10-CM | POA: Insufficient documentation

## 2022-07-24 DIAGNOSIS — Z170 Estrogen receptor positive status [ER+]: Secondary | ICD-10-CM

## 2022-07-24 DIAGNOSIS — C50412 Malignant neoplasm of upper-outer quadrant of left female breast: Secondary | ICD-10-CM | POA: Insufficient documentation

## 2022-07-24 DIAGNOSIS — G620 Drug-induced polyneuropathy: Secondary | ICD-10-CM | POA: Insufficient documentation

## 2022-07-24 DIAGNOSIS — Z79899 Other long term (current) drug therapy: Secondary | ICD-10-CM | POA: Diagnosis not present

## 2022-07-24 DIAGNOSIS — Z803 Family history of malignant neoplasm of breast: Secondary | ICD-10-CM | POA: Diagnosis not present

## 2022-07-24 DIAGNOSIS — R200 Anesthesia of skin: Secondary | ICD-10-CM | POA: Diagnosis not present

## 2022-07-24 DIAGNOSIS — Z923 Personal history of irradiation: Secondary | ICD-10-CM | POA: Diagnosis not present

## 2022-07-24 DIAGNOSIS — Z8249 Family history of ischemic heart disease and other diseases of the circulatory system: Secondary | ICD-10-CM | POA: Diagnosis not present

## 2022-07-24 DIAGNOSIS — T451X5A Adverse effect of antineoplastic and immunosuppressive drugs, initial encounter: Secondary | ICD-10-CM | POA: Insufficient documentation

## 2022-07-24 DIAGNOSIS — Z9221 Personal history of antineoplastic chemotherapy: Secondary | ICD-10-CM | POA: Diagnosis not present

## 2022-07-24 DIAGNOSIS — E559 Vitamin D deficiency, unspecified: Secondary | ICD-10-CM

## 2022-07-24 DIAGNOSIS — Z801 Family history of malignant neoplasm of trachea, bronchus and lung: Secondary | ICD-10-CM | POA: Insufficient documentation

## 2022-07-24 DIAGNOSIS — Z1501 Genetic susceptibility to malignant neoplasm of breast: Secondary | ICD-10-CM | POA: Diagnosis not present

## 2022-07-24 DIAGNOSIS — Z800 Family history of malignant neoplasm of digestive organs: Secondary | ICD-10-CM | POA: Diagnosis not present

## 2022-07-24 DIAGNOSIS — Z79811 Long term (current) use of aromatase inhibitors: Secondary | ICD-10-CM | POA: Diagnosis not present

## 2022-07-24 LAB — CBC WITH DIFFERENTIAL (CANCER CENTER ONLY)
Abs Immature Granulocytes: 0.01 10*3/uL (ref 0.00–0.07)
Basophils Absolute: 0 10*3/uL (ref 0.0–0.1)
Basophils Relative: 0 %
Eosinophils Absolute: 0 10*3/uL (ref 0.0–0.5)
Eosinophils Relative: 1 %
HCT: 42.1 % (ref 36.0–46.0)
Hemoglobin: 15.1 g/dL — ABNORMAL HIGH (ref 12.0–15.0)
Immature Granulocytes: 0 %
Lymphocytes Relative: 30 %
Lymphs Abs: 2.1 10*3/uL (ref 0.7–4.0)
MCH: 34.2 pg — ABNORMAL HIGH (ref 26.0–34.0)
MCHC: 35.9 g/dL (ref 30.0–36.0)
MCV: 95.2 fL (ref 80.0–100.0)
Monocytes Absolute: 0.5 10*3/uL (ref 0.1–1.0)
Monocytes Relative: 6 %
Neutro Abs: 4.5 10*3/uL (ref 1.7–7.7)
Neutrophils Relative %: 63 %
Platelet Count: 243 10*3/uL (ref 150–400)
RBC: 4.42 MIL/uL (ref 3.87–5.11)
RDW: 12.6 % (ref 11.5–15.5)
WBC Count: 7.2 10*3/uL (ref 4.0–10.5)
nRBC: 0 % (ref 0.0–0.2)

## 2022-07-24 LAB — IRON AND IRON BINDING CAPACITY (CC-WL,HP ONLY)
Iron: 107 ug/dL (ref 28–170)
Saturation Ratios: 29 % (ref 10.4–31.8)
TIBC: 375 ug/dL (ref 250–450)
UIBC: 268 ug/dL (ref 148–442)

## 2022-07-24 LAB — CMP (CANCER CENTER ONLY)
ALT: 25 U/L (ref 0–44)
AST: 26 U/L (ref 15–41)
Albumin: 4.3 g/dL (ref 3.5–5.0)
Alkaline Phosphatase: 124 U/L (ref 38–126)
Anion gap: 6 (ref 5–15)
BUN: 14 mg/dL (ref 6–20)
CO2: 30 mmol/L (ref 22–32)
Calcium: 9.9 mg/dL (ref 8.9–10.3)
Chloride: 105 mmol/L (ref 98–111)
Creatinine: 0.98 mg/dL (ref 0.44–1.00)
GFR, Estimated: >60 mL/min (ref >60)
Glucose, Bld: 97 mg/dL (ref 70–99)
Potassium: 3.6 mmol/L (ref 3.5–5.1)
Sodium: 141 mmol/L (ref 135–145)
Total Bilirubin: 0.8 mg/dL (ref 0.3–1.2)
Total Protein: 7.4 g/dL (ref 6.5–8.1)

## 2022-07-24 LAB — VITAMIN D 25 HYDROXY (VIT D DEFICIENCY, FRACTURES): Vit D, 25-Hydroxy: 79.49 ng/mL (ref 30–100)

## 2022-07-24 LAB — FERRITIN: Ferritin: 81 ng/mL (ref 11–307)

## 2022-07-24 MED ORDER — OXYCODONE HCL 5 MG PO TABS: 5.0000 mg | ORAL_TABLET | Freq: Four times a day (QID) | ORAL | 0 refills | Status: AC | PRN

## 2022-07-24 MED ORDER — DICYCLOMINE HCL 20 MG PO TABS
20.0000 mg | ORAL_TABLET | Freq: Four times a day (QID) | ORAL | 3 refills | Status: DC
Start: 2022-07-24 — End: 2024-02-23

## 2022-07-24 NOTE — Progress Notes (Signed)
[name]:    [name], NP No address on file   DIAGNOSIS:  Cancer Staging  Malignant neoplasm of upper-outer quadrant of left breast in female, estrogen receptor positive ([place]) Staging form: Breast, AJCC 8th Edition - Clinical stage from 02/13/2021: Stage IIIA (cT2, cN1, cM0, G3, ER+, PR-, HER2-) - Signed by [name], MD on 02/13/2021 Stage prefix: Initial diagnosis Histologic grading system: 3 grade system Laterality: Left Staged by: Pathologist and managing physician Stage used in treatment planning: Yes National guidelines used in treatment planning: Yes Type of national guideline used in treatment planning: NCCN - Pathologic stage from 08/21/2021: pT0, pN0, cM0 - Signed by [name], NP on 10/30/2021 Stage prefix: Initial diagnosis   SUMMARY OF ONCOLOGIC HISTORY: Oncology History  Malignant neoplasm of upper-outer quadrant of left breast in female, estrogen receptor positive ([place])  02/11/2021 Initial Diagnosis   left breast upper outer quadrant biopsy 02/07/2021 for a clinical T2 Ni, stage IIIA invasive ductal carcinoma, grade 3, ER 70% weak, PR 0% and HER2 negative, Ki-67: 40%   02/13/2021 Cancer Staging   Staging form: Breast, AJCC 8th Edition - Clinical stage from 02/13/2021: Stage IIIA (cT2, cN1, cM0, G3, ER+, PR-, HER2-) - Signed by [name], MD on 02/13/2021 Stage prefix: Initial diagnosis Histologic grading system: 3 grade system Laterality: Left Staged by: Pathologist and managing physician Stage used in treatment planning: Yes National guidelines used in treatment planning: Yes Type of national guideline used in treatment planning: NCCN   02/19/2021 Genetic Testing   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    Genes tested: APC, ATM, AXIN2, BARD1, BMPR1A,  BRCA1, BRCA2, BRIP1, CDH1, CDK4, CDKN2A, CHEK2, DICER1, EPCAM, GREM1, HOXB13, MEN1, MLH1, MSH2, MSH3, MSH6, MUTYH, NBN, NF1, NF2, NTHL1, PALB2, PMS2, POLD1, POLE, PTEN, RAD51C, RAD51D, RECQL, RET, SDHA, SDHAF2, SDHB, SDHC, SDHD, SMAD4, SMARCA4, STK11, TP53, TSC1, TSC2, and VHL.  RNA data is routinely analyzed for use in variant interpretation for all genes.  Recommendations:     Management Recommendations for individuals with a single MUTYH pathogenic variant:  Changes in the MUTYH gene are associated with MUTYH-associated Polyposis syndrome (MAP). MAP is a hereditary cancer condition in which patients have high risks for colorectal cancer due to a large number of adenomatous polyps in the gastrointestinal system. MAP is unique among the hereditary cancer syndromes in that it is a "recessive" condition. Most hereditary cancer conditions are "dominant", meaning the condition is present in patients with a mutation in one copy of the gene. Patients have MAP only if there are mutations in both of their copies of the MUTYH gene. 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    Colon Cancer Screening: If an individual has a first-degree relative with colorectal cancer, screening should begin at age 33 or 43 years prior to the relative's age at diagnosis, whichever comes first and repeat colonoscopies every 5 years.   If an individual has no first-degree relatives with colorectal cancer, data is unclear if specialized screening is warranted. We advise these patients to follow their gastroenterologists recommendations.   03/01/2021 -  Neo-Adjuvant Chemotherapy   neoadjuvant chemotherapy will consist of paclitaxel weekly x12 followed by dose dense doxorubicin and cyclophosphamide x4   08/21/2021 Surgery   Left  lumpectomy: Negative for residual cancer.  Margins negative, 0/14 lymph nodes negative   08/21/2021 Cancer Staging    Staging form: Breast, AJCC 8th Edition - Pathologic stage from 08/21/2021: pT0, pN0, cM0 - Signed by [name], NP on 10/30/2021 Stage prefix: Initial diagnosis   09/30/2021 - 11/07/2021 Radiation Therapy   Adjuvant radiation   10/2021 -  Anti-estrogen oral therapy   Anastrozole daily     CURRENT THERAPY: Anastrozole  INTERVAL HISTORY: Catherine Ellison 47 y.o. female returns for follow-up and evaluation.  She continues on anastrozole daily.  She does experience some hot flashes which she is managing.  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  She tried some compression stockings which felt like tourniquets on her legs.  She is taking Lyrica 100 mg p.o. twice daily for the neuropathy however she is not taking the Cymbalta.  She tells me that she had some leftover oxycodone which really helped when her pain was severe.  She wants to know if she can get this refilled for 1 month until she can go see the neurologist in [place] about her neuropathy.  She also has been following up with GI and Dr. Cheyenne Adas for a liver cyst.  Her most recent ultrasound in February showed stability in this liver cyst.  She is planning to schedule follow-up with him and requests a refill on Bentyl for her IBS in the meantime.  Her most recent mammogram occurred in January 2024 and was negative for malignancy.  She denies any other significant issues and is doing well otherwise.   Patient Active Problem List   Diagnosis Date Noted   Chemotherapy-induced peripheral neuropathy ([place]) 07/25/2022   S/P lumpectomy, left breast [id number]   Monoallelic mutation of MUTYH gene 03/04/2021   Genetic testing 02/20/2021   Family history of breast cancer 02/13/2021   Family history of colon cancer 02/13/2021   Malignant neoplasm of upper-outer quadrant of left breast in female, estrogen receptor positive ([place]) 02/11/2021   Cervical cancer  screening 06/30/2019   Menorrhagia with regular cycle 06/30/2019   Vaginal bleeding 06/30/2019   Adjustment disorder with mixed anxiety and depressed mood 11/06/2016    is allergic to latex.  MEDICAL HISTORY: Past Medical History:  Diagnosis Date   Allergy    seasonal    Anemia    Dyspnea    with heavy exertion   GERD (gastroesophageal reflux disease)    History of gestational diabetes 2005   Hyperemesis gravidarum    with all pregnancies   IBS (irritable bowel syndrome)    left breast cancer    Peptic ulcer    Personal history of chemotherapy    Personal history of radiation therapy    Pneumonia    Port-A-Cath in place 03/14/2021    SURGICAL HISTORY: Past Surgical History:  Procedure Laterality Date   BILATERAL SALPINGECTOMY Bilateral 2014   emergency   BREAST BIOPSY Bilateral 02/07/2021   BREAST BIOPSY Left 02/14/2021   BREAST LUMPECTOMY Left 08/21/2021   IR CV LINE INJECTION  05/29/2021   LEFT OOPHORECTOMY Left 2006   PORTACATH PLACEMENT Right 02/28/2021   Procedure: INSERTION PORT-A-CATH;  Surgeon: Coralie Keens, MD;  Location: [facility];  Service: General;  Laterality: Right;   RADIOACTIVE SEED GUIDED PARTIAL MASTECTOMY/AXILLARY SENTINEL NODE BIOPSY/AXILLARY NODE DISSECTION Left 08/21/2021   Procedure: LEFT BREAST RADIOACTIVE SEED GUIDED LUMPECTOMY AND COMPLETE LEFT AXILLARY LYMPH NODE DISSECTION;  Surgeon: Coralie Keens, MD;  Location: [facility];  Service: General;  Laterality: Left;  LMA PECTORAL BLOCK    SOCIAL HISTORY: Social History   Socioeconomic History   Marital status: Divorced    Spouse name: Not on file   Number of children: 3   Years of education: Not on file   Highest education level: Not on file  Occupational History   Occupation: nurse    Comment: mental health  Tobacco Use   Smoking status: Never   Smokeless tobacco: Never  Vaping Use   Vaping Use: Never used  Substance and Sexual Activity    Alcohol use: Yes    Alcohol/week: 5.0 standard drinks of alcohol    Types: 1 Glasses of wine, 1 Cans of beer, 1 Shots of liquor, 2 Standard drinks or equivalent per week    Comment: Occasional   Drug use: No   Sexual activity: Yes  Other Topics Concern   Not on file  Social History Narrative   Lives with her youngest child.   Social Determinants of Health   Financial Resource Strain: Medium Risk (02/13/2021)   Overall Financial Resource Strain (CARDIA)    Difficulty of Paying Living Expenses: Somewhat hard  Food Insecurity: No Food Insecurity (02/13/2021)   Hunger Vital Sign    Worried About Running Out of Food in the Last Year: Never true    Ran Out of Food in the Last Year: Never true  Transportation Needs: No Transportation Needs (02/13/2021)   PRAPARE - Hydrologist (Medical): No    Lack of Transportation (Non-Medical): No  Physical Activity: Not on file  Stress: Not on file  Social Connections: Not on file  Intimate Partner Violence: Not on file    FAMILY HISTORY: Family History  Problem Relation Age of Onset   Diabetes Mother    Hypertension Mother    Thyroid disease Mother    Diabetes Father    Heart disease Father        4V CABG 03/2016, age 63   Hyperlipidemia Father    Hypertension Father    Hypothyroidism Father    Diabetes Sister    Hypertension Sister    Hypertension Maternal Grandmother    Colon cancer Maternal Grandmother        dx. >50   Hypertension Maternal Grandfather    Hypertension Paternal Grandmother    Kidney disease Paternal Grandmother    Hypertension Paternal Grandfather    Lung cancer Paternal Grandfather    Breast cancer Maternal Aunt        dx. 40s    Review of Systems  Constitutional:  Negative for appetite change, chills, fatigue, fever and unexpected weight change.  HENT:   Negative for hearing loss, lump/mass and trouble swallowing.   Eyes:  Negative for eye problems and icterus.  Respiratory:   Negative for chest tightness, cough and shortness of breath.   Cardiovascular:  Negative for chest pain, leg swelling and palpitations.  Gastrointestinal:  Negative for abdominal distention, abdominal pain, constipation, diarrhea, nausea and vomiting.  Endocrine: Positive for hot flashes.  Genitourinary:  Negative for difficulty urinating.   Musculoskeletal:  Negative for arthralgias.  Skin:  Negative for itching and rash.  Neurological:  Positive for numbness. Negative for dizziness, extremity weakness and headaches.  Hematological:  Negative for adenopathy. Does not bruise/bleed easily.  Psychiatric/Behavioral:  Negative for depression. The patient is not nervous/anxious.       PHYSICAL EXAMINATION  ECOG PERFORMANCE STATUS: 1 - Symptomatic but  completely ambulatory  Vitals:   07/24/22 1335  BP: (!) 141/91  Pulse: (!) 101  Resp: 20  Temp: 98.1 F (36.7 C)  SpO2: 100%    Physical Exam Constitutional:      General: She is not in acute distress.    Appearance: Normal appearance. She is not toxic-appearing.  HENT:     Head: Normocephalic and atraumatic.  Eyes:     General: No scleral icterus. Cardiovascular:     Rate and Rhythm: Normal rate and regular rhythm.     Pulses: Normal pulses.     Heart sounds: Normal heart sounds.  Pulmonary:     Effort: Pulmonary effort is normal.     Breath sounds: Normal breath sounds.  Chest:     Comments: Left breast status postlumpectomy and radiation no sign of local recurrence right breast is benign.  While doing her breast exam I noted she had difficult range of motion in her left shoulder. Abdominal:     General: Abdomen is flat. Bowel sounds are normal. There is no distension.     Palpations: Abdomen is soft.     Tenderness: There is no abdominal tenderness.  Musculoskeletal:        General: No swelling.     Cervical back: Neck supple.  Lymphadenopathy:     Cervical: No cervical adenopathy.  Skin:    General: Skin is warm and  dry.     Findings: No rash.  Neurological:     General: No focal deficit present.     Mental Status: She is alert.  Psychiatric:        Mood and Affect: Mood normal.        Behavior: Behavior normal.     LABORATORY DATA:  CBC    Component Value Date/Time   WBC 7.2 07/24/2022 1322   RBC 4.42 07/24/2022 1322   HGB 15.1 (H) 07/24/2022 1322   HGB 9.5 (L) 06/30/2019 1551   HCT 42.1 07/24/2022 1322   HCT 30.8 (L) 06/30/2019 1551   PLT 243 07/24/2022 1322   PLT 389 06/30/2019 1551   MCV 95.2 07/24/2022 1322   MCV 72 (L) 06/30/2019 1551   MCH 34.2 (H) 07/24/2022 1322   MCHC 35.9 07/24/2022 1322   RDW 12.6 07/24/2022 1322   RDW 16.8 (H) 06/30/2019 1551   LYMPHSABS 2.1 07/24/2022 1322   LYMPHSABS 3.4 (H) 06/30/2019 1551   MONOABS 0.5 07/24/2022 1322   EOSABS 0.0 07/24/2022 1322   EOSABS 0.1 06/30/2019 1551   BASOSABS 0.0 07/24/2022 1322   BASOSABS 0.1 06/30/2019 1551    CMP     Component Value Date/Time   NA 141 07/24/2022 1322   NA 138 08/20/2016 1712   K 3.6 07/24/2022 1322   CL 105 07/24/2022 1322   CO2 30 07/24/2022 1322   GLUCOSE 97 07/24/2022 1322   BUN 14 07/24/2022 1322   BUN 11 08/20/2016 1712   CREATININE 0.98 07/24/2022 1322   CALCIUM 9.9 07/24/2022 1322   PROT 7.4 07/24/2022 1322   PROT 6.9 08/20/2016 1712   ALBUMIN 4.3 07/24/2022 1322   ALBUMIN 4.0 08/20/2016 1712   AST 26 07/24/2022 1322   ALT 25 07/24/2022 1322   ALKPHOS 124 07/24/2022 1322   BILITOT 0.8 07/24/2022 1322   GFRNONAA >60 07/24/2022 1322   GFRAA 88 08/20/2016 1712       ASSESSMENT and THERAPY PLAN:   Chemotherapy-induced peripheral neuropathy ([facility]) She continues on Lyrica 100 mg p.o. twice daily.  She is due  to undergo evaluation with neurology in [place] on March 28.  Since she has been on her feet increasingly I gave her #30 of oxycodone.  We reviewed that she will follow-up with her neurologist about her neuropathy and oxycodone as they indicate.    Malignant neoplasm of  upper-outer quadrant of left breast in female, estrogen receptor positive ([place]) Junius Creamer has no clinical or radiographic signs of breast cancer recurrence.  She will continue on anastrozole daily as she is tolerating it well.  Her mammogram is due in January 2025.  I recommended that she continue follow-up with GI.  She will receive further prescriptions for the Bentyl from their office.  She has left shoulder range of motion limitation.  I recommended she get in with physical therapy.  Since she lives in [place] she wants to do this locally and will call us with where we can send the referral to.  I recommended healthy diet and exercise.  Her labs all are stable which is great news.  We will see her back in 6 months for lab and f/u with Dr. Lindi Adie.      All questions were answered. The patient knows to call the clinic with any problems, questions or concerns. We can certainly see the patient much sooner if necessary.  Total encounter time:40 minutes*in face-to-face visit time, chart review, lab review, care coordination, order entry, and documentation of the encounter time.    Wilber Bihari, NP 07/25/22 12:53 PM Medical Oncology and Hematology [facility] [place], [place] [address] Tel. [phone]    Fax. [phone]  *Total Encounter Time as defined by the Centers for Medicare and Medicaid Services includes, in addition to the face-to-face time of a patient visit (documented in the note above) non-face-to-face time: obtaining and reviewing outside history, ordering and reviewing medications, tests or procedures, care coordination (communications with other health care professionals or caregivers) and documentation in the medical record.

## 2022-07-25 DIAGNOSIS — G620 Drug-induced polyneuropathy: Secondary | ICD-10-CM | POA: Insufficient documentation

## 2022-07-25 NOTE — Assessment & Plan Note (Signed)
She continues on Lyrica 100 mg p.o. twice daily.  She is due to undergo evaluation with neurology in [place] on March 28.  Since she has been on her feet increasingly I gave her #30 of oxycodone.  We reviewed that she will follow-up with her neurologist about her neuropathy and oxycodone as they indicate.

## 2022-07-25 NOTE — Assessment & Plan Note (Signed)
Catherine Ellison has no clinical or radiographic signs of breast cancer recurrence.  She will continue on anastrozole daily as she is tolerating it well.  Her mammogram is due in January 2025.  I recommended that she continue follow-up with GI.  She will receive further prescriptions for the Bentyl from their office.  She has left shoulder range of motion limitation.  I recommended she get in with physical therapy.  Since she lives in [place] she wants to do this locally and will call us with where we can send the referral to.  I recommended healthy diet and exercise.  Her labs all are stable which is great news.  We will see her back in 6 months for lab and f/u with Dr. Lindi Adie.

## 2022-08-04 ENCOUNTER — Telehealth: Payer: Self-pay | Admitting: Adult Health

## 2022-08-04 NOTE — Telephone Encounter (Signed)
Scheduled appointment per 3/7 los. Unable to leave a voicemail due to mailbox being full. Patient will be mailed an appointment reminder.

## 2022-11-04 ENCOUNTER — Encounter: Payer: Self-pay | Admitting: Adult Health

## 2022-12-01 ENCOUNTER — Other Ambulatory Visit: Payer: Self-pay | Admitting: Hematology and Oncology

## 2022-12-15 ENCOUNTER — Encounter: Payer: Self-pay | Admitting: Adult Health

## 2022-12-25 ENCOUNTER — Inpatient Hospital Stay: Payer: BC Managed Care – PPO | Attending: Adult Health | Admitting: Adult Health

## 2022-12-25 ENCOUNTER — Encounter: Payer: Self-pay | Admitting: Adult Health

## 2023-01-02 ENCOUNTER — Inpatient Hospital Stay: Payer: BC Managed Care – PPO | Admitting: Adult Health

## 2023-01-23 ENCOUNTER — Other Ambulatory Visit: Payer: Self-pay

## 2023-01-23 DIAGNOSIS — D508 Other iron deficiency anemias: Secondary | ICD-10-CM

## 2023-01-23 DIAGNOSIS — Z170 Estrogen receptor positive status [ER+]: Secondary | ICD-10-CM

## 2023-01-26 ENCOUNTER — Other Ambulatory Visit: Payer: Self-pay | Admitting: Hematology and Oncology

## 2023-01-26 ENCOUNTER — Inpatient Hospital Stay: Payer: BC Managed Care – PPO | Attending: Hematology and Oncology

## 2023-01-26 ENCOUNTER — Telehealth: Payer: Self-pay | Admitting: *Deleted

## 2023-01-26 ENCOUNTER — Inpatient Hospital Stay (HOSPITAL_BASED_OUTPATIENT_CLINIC_OR_DEPARTMENT_OTHER): Payer: BC Managed Care – PPO | Admitting: Hematology and Oncology

## 2023-01-26 VITALS — BP 148/99 | HR 115 | Temp 98.5°F | Resp 19 | Wt 188.1 lb

## 2023-01-26 DIAGNOSIS — C50412 Malignant neoplasm of upper-outer quadrant of left female breast: Secondary | ICD-10-CM | POA: Diagnosis not present

## 2023-01-26 DIAGNOSIS — Z1589 Genetic susceptibility to other disease: Secondary | ICD-10-CM

## 2023-01-26 DIAGNOSIS — Z170 Estrogen receptor positive status [ER+]: Secondary | ICD-10-CM

## 2023-01-26 DIAGNOSIS — Z800 Family history of malignant neoplasm of digestive organs: Secondary | ICD-10-CM

## 2023-01-26 DIAGNOSIS — R7401 Elevation of levels of liver transaminase levels: Secondary | ICD-10-CM

## 2023-01-26 DIAGNOSIS — D508 Other iron deficiency anemias: Secondary | ICD-10-CM

## 2023-01-26 DIAGNOSIS — Z8639 Personal history of other endocrine, nutritional and metabolic disease: Secondary | ICD-10-CM | POA: Insufficient documentation

## 2023-01-26 DIAGNOSIS — Z79811 Long term (current) use of aromatase inhibitors: Secondary | ICD-10-CM | POA: Diagnosis not present

## 2023-01-26 DIAGNOSIS — R110 Nausea: Secondary | ICD-10-CM | POA: Diagnosis not present

## 2023-01-26 LAB — CMP (CANCER CENTER ONLY)
ALT: 158 U/L — ABNORMAL HIGH (ref 0–44)
AST: 276 U/L (ref 15–41)
Albumin: 3.9 g/dL (ref 3.5–5.0)
Alkaline Phosphatase: 107 U/L (ref 38–126)
Anion gap: 7 (ref 5–15)
BUN: 17 mg/dL (ref 6–20)
CO2: 30 mmol/L (ref 22–32)
Calcium: 8.7 mg/dL — ABNORMAL LOW (ref 8.9–10.3)
Chloride: 97 mmol/L — ABNORMAL LOW (ref 98–111)
Creatinine: 0.98 mg/dL (ref 0.44–1.00)
GFR, Estimated: >60 mL/min (ref >60)
Glucose, Bld: 92 mg/dL (ref 70–99)
Potassium: 3.5 mmol/L (ref 3.5–5.1)
Sodium: 134 mmol/L — ABNORMAL LOW (ref 135–145)
Total Bilirubin: 1 mg/dL (ref 0.3–1.2)
Total Protein: 7.4 g/dL (ref 6.5–8.1)

## 2023-01-26 LAB — CBC WITH DIFFERENTIAL (CANCER CENTER ONLY)
Abs Immature Granulocytes: 0.02 10*3/uL (ref 0.00–0.07)
Basophils Absolute: 0.1 10*3/uL (ref 0.0–0.1)
Basophils Relative: 1 %
Eosinophils Absolute: 0.1 10*3/uL (ref 0.0–0.5)
Eosinophils Relative: 1 %
HCT: 45.4 % (ref 36.0–46.0)
Hemoglobin: 16.3 g/dL — ABNORMAL HIGH (ref 12.0–15.0)
Immature Granulocytes: 0 %
Lymphocytes Relative: 21 %
Lymphs Abs: 1.7 10*3/uL (ref 0.7–4.0)
MCH: 34.6 pg — ABNORMAL HIGH (ref 26.0–34.0)
MCHC: 35.9 g/dL (ref 30.0–36.0)
MCV: 96.4 fL (ref 80.0–100.0)
Monocytes Absolute: 0.6 10*3/uL (ref 0.1–1.0)
Monocytes Relative: 7 %
Neutro Abs: 5.5 10*3/uL (ref 1.7–7.7)
Neutrophils Relative %: 70 %
Platelet Count: 212 10*3/uL (ref 150–400)
RBC: 4.71 MIL/uL (ref 3.87–5.11)
RDW: 12.2 % (ref 11.5–15.5)
WBC Count: 7.8 10*3/uL (ref 4.0–10.5)
nRBC: 0 % (ref 0.0–0.2)

## 2023-01-26 LAB — VITAMIN D 25 HYDROXY (VIT D DEFICIENCY, FRACTURES): Vit D, 25-Hydroxy: 92.78 ng/mL (ref 30–100)

## 2023-01-26 LAB — IRON AND IRON BINDING CAPACITY (CC-WL,HP ONLY)
Iron: 268 ug/dL — ABNORMAL HIGH (ref 28–170)
Saturation Ratios: 91 % — ABNORMAL HIGH (ref 10.4–31.8)
TIBC: 295 ug/dL (ref 250–450)
UIBC: 27 ug/dL — ABNORMAL LOW (ref 148–442)

## 2023-01-26 LAB — FERRITIN: Ferritin: 1384 ng/mL — ABNORMAL HIGH (ref 11–307)

## 2023-01-26 MED ORDER — PROCHLORPERAZINE MALEATE 10 MG PO TABS: 10.0000 mg | ORAL_TABLET | Freq: Four times a day (QID) | ORAL | 1 refills | Status: AC | PRN

## 2023-01-26 MED ORDER — PREGABALIN 300 MG PO CAPS: 300.0000 mg | ORAL_CAPSULE | Freq: Three times a day (TID) | ORAL | Status: AC

## 2023-01-26 NOTE — Telephone Encounter (Signed)
Called from lab AST of [id number] - this RN verbally informed Dr Pamelia Hoit

## 2023-01-26 NOTE — Addendum Note (Signed)
Addended by: Serena Croissant on: 01/26/2023 02:52 PM   Modules accepted: Orders

## 2023-01-26 NOTE — Progress Notes (Addendum)
Patient Care Team: Royal Hawthorn, NP as PCP - General (Adult Health Nurse Practitioner) Abigail Miyamoto, MD as Consulting Physician (General Surgery) Lonie Peak, MD as Attending Physician (Radiation Oncology) Loa Socks, NP as Nurse Practitioner (Hematology and Oncology) Serena Croissant, MD as Consulting Physician (Hematology and Oncology) Kathi Der, MD as Consulting Physician (Gastroenterology)  DIAGNOSIS:  Encounter Diagnosis  Name Primary?   Malignant neoplasm of upper-outer quadrant of left breast in female, estrogen receptor positive (HCC) Yes    SUMMARY OF ONCOLOGIC HISTORY: Oncology History  Malignant neoplasm of upper-outer quadrant of left breast in female, estrogen receptor positive (HCC)  02/11/2021 Initial Diagnosis   left breast upper outer quadrant biopsy 02/07/2021 for a clinical T2 Ni, stage IIIA invasive ductal carcinoma, grade 3, ER 70% weak, PR 0% and HER2 negative, Ki-67: 40%   02/13/2021 Cancer Staging   Staging form: Breast, AJCC 8th Edition - Clinical stage from 02/13/2021: Stage IIIA (cT2, cN1, cM0, G3, ER+, PR-, HER2-) - Signed by Lowella Dell, MD on 02/13/2021 Stage prefix: Initial diagnosis Histologic grading system: 3 grade system Laterality: Left Staged by: Pathologist and managing physician Stage used in treatment planning: Yes National guidelines used in treatment planning: Yes Type of national guideline used in treatment planning: NCCN   02/19/2021 Genetic Testing   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    Genes tested: APC, ATM, AXIN2, BARD1, BMPR1A, BRCA1, BRCA2, BRIP1, CDH1, CDK4, CDKN2A, CHEK2, DICER1, EPCAM, GREM1, HOXB13, MEN1, MLH1, MSH2, MSH3, MSH6, MUTYH, NBN, NF1, NF2, NTHL1, PALB2, PMS2, POLD1, POLE, PTEN, RAD51C, RAD51D, RECQL, RET, SDHA, SDHAF2, SDHB, SDHC, SDHD,  SMAD4, SMARCA4, STK11, TP53, TSC1, TSC2, and VHL.  RNA data is routinely analyzed for use in variant interpretation for all genes.  Recommendations:     Management Recommendations for individuals with a single MUTYH pathogenic variant:  Changes in the MUTYH gene are associated with MUTYH-associated Polyposis syndrome (MAP). MAP is a hereditary cancer condition in which patients have high risks for colorectal cancer due to a large number of adenomatous polyps in the gastrointestinal system. MAP is unique among the hereditary cancer syndromes in that it is a "recessive" condition. Most hereditary cancer conditions are "dominant", meaning the condition is present in patients with a mutation in one copy of the gene. Patients have MAP only if there are mutations in both of their copies of the MUTYH gene. 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    Colon Cancer Screening: If an individual has a first-degree relative with colorectal cancer, screening should begin at age 59 or 10 years prior to the relative's age at diagnosis, whichever comes first and repeat colonoscopies every 5 years.   If an individual has no first-degree relatives with colorectal cancer, data is unclear if specialized screening is warranted. We advise these patients to follow their gastroenterologists recommendations.   03/01/2021 -  Neo-Adjuvant Chemotherapy   neoadjuvant chemotherapy will consist of paclitaxel weekly x12 followed by dose dense doxorubicin and cyclophosphamide x4   08/21/2021 Surgery   Left lumpectomy: Negative for residual cancer.  Margins negative, 0/14 lymph nodes negative   08/21/2021 Cancer Staging   Staging form: Breast, AJCC 8th Edition - Pathologic stage from 08/21/2021: pT0, pN0, cM0 - Signed by Loa Socks, NP on 10/30/2021 Stage prefix: Initial  diagnosis   09/30/2021 - 11/07/2021 Radiation Therapy   Adjuvant  radiation   10/2021 -  Anti-estrogen oral therapy   Anastrozole daily     CHIEF COMPLIANT: Follow-up on anastrozole   INTERVAL HISTORY: Catherine Ellison is a  47 y.o. with the above mentioned history of breast cancer currently on anastrozole therapy. She presents to the clinic today for a follow-up  Patient reports 2 weeks ago she stated having nausea. She states that she is still feel nauseated as of today. She does have some severe hot flashes. Does have some pain under axilla and when she lift arm and shoulder.  ALLERGIES:  is allergic to latex.  MEDICATIONS:  Current Outpatient Medications  Medication Sig Dispense Refill   acetaminophen (TYLENOL) 325 MG tablet Take 650 mg by mouth every 6 (six) hours as needed.     anastrozole (ARIMIDEX) 1 MG tablet TAKE 1 TABLET BY MOUTH EVERY DAY 90 tablet 3   dicyclomine (BENTYL) 20 MG tablet Take 1 tablet (20 mg total) by mouth every 6 (six) hours. 30 tablet 3   famotidine (PEPCID) 20 MG tablet TAKE 1 TABLET BY MOUTH TWICE A DAY 60 tablet 5   ibuprofen (ADVIL) 800 MG tablet Take 800 mg by mouth every 8 (eight) hours as needed for mild pain.     linaclotide (LINZESS) 72 MCG capsule Take 1 capsule (72 mcg total) by mouth daily. linzess 90 capsule 3   melatonin 3 MG TABS tablet Take 1 tablet (3 mg total) by mouth at bedtime. 60 tablet 0   oxyCODONE (OXY IR/ROXICODONE) 5 MG immediate release tablet Take 1 tablet (5 mg total) by mouth every 6 (six) hours as needed for severe pain or moderate pain. 30 tablet 0   pantoprazole (PROTONIX) 40 MG tablet Take 1 tablet (40 mg total) by mouth daily. 90 tablet 3   pregabalin (LYRICA) 300 MG capsule Take 1 capsule (300 mg total) by mouth in the morning, at noon, and at bedtime.     Vitamin D, Ergocalciferol, (DRISDOL) 1.25 MG (50000 UNIT) CAPS capsule TAKE 1 CAPSULE BY MOUTH ONE TIME PER WEEK 4 capsule 2   prochlorperazine (COMPAZINE) 10 MG tablet Take 1 tablet (10 mg total) by mouth every 6 (six) hours as  needed for nausea or vomiting. 30 tablet 1   No current facility-administered medications for this visit.    PHYSICAL EXAMINATION: ECOG PERFORMANCE STATUS: 1 - Symptomatic but completely ambulatory  Vitals:   01/26/23 1353  BP: (!) 148/99  Pulse: (!) 115  Resp: 19  Temp: 98.5 F (36.9 C)  SpO2: 100%   Filed Weights   01/26/23 1353  Weight: 188 lb 1.6 oz (85.3 kg)     LABORATORY DATA:  I have reviewed the data as listed    Latest Ref Rng & Units 01/26/2023    1:29 PM 07/24/2022    1:22 PM 04/22/2022    2:02 PM  CMP  Glucose 70 - 99 mg/dL 92  97  147   BUN 6 - 20 mg/dL 17  14  13    Creatinine 0.44 - 1.00 mg/dL 8.29  5.62  1.30   Sodium 135 - 145 mmol/L 134  141  138   Potassium 3.5 - 5.1 mmol/L 3.5  3.6  3.6   Chloride 98 - 111 mmol/L 97  105  104   CO2 22 - 32 mmol/L 30  30  27    Calcium 8.9 - 10.3 mg/dL 8.7  9.9  86.5  Total Protein 6.5 - 8.1 g/dL 7.4  7.4  7.7   Total Bilirubin 0.3 - 1.2 mg/dL 1.0  0.8  0.7   Alkaline Phos 38 - 126 U/L 107  124  139   AST 15 - 41 U/L 276  26  21   ALT 0 - 44 U/L 158  25  20     Lab Results  Component Value Date   WBC 7.8 01/26/2023   HGB 16.3 (H) 01/26/2023   HCT 45.4 01/26/2023   MCV 96.4 01/26/2023   PLT 212 01/26/2023   NEUTROABS 5.5 01/26/2023    ASSESSMENT & PLAN:  Malignant neoplasm of upper-outer quadrant of left breast in female, estrogen receptor positive (HCC) eft breast upper outer quadrant biopsy 02/07/2021 for a clinical T2 Ni, stage IIIA invasive ductal carcinoma, grade 3, 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  Neoadjuvant chemotherapy with weekly Taxol x12 followed by dose dense Adriamycin and Cytoxan  2. 08/21/2021:Left lumpectomy: Negative for residual cancer.  Margins negative, 0/14 lymph nodes negative 3.  Followed by radiation completed 11/07/2021 4.  Followed by antiestrogen therapy with anastrozole started June  2023 -------------------------------------------------------------------------------------------------------------------------------- Neuropathy:Patient is currently on 400 mg of gabapentin 3 times a day and 40 mg of Cymbalta twice a day   Breast cancer surveillance:: Mammogram and ultrasound 05/27/2022: Palpable area of concern benign cyst 2 cm 07/07/2022 ultrasound of the liver multiple liver cysts largest 9.9 cm, fatty liver  Anastrozole toxicities: Hot flashes can be moderate to severe Elevated LFTs: Unclear etiology.  I have discussed with her about stopping anastrozole for a week and rechecking the labs.  Tenderness in the left axilla: Post radiation effects.  Previous ultrasound was negative.  I encouraged her to apply CBD oil.  She takes Lyrica for neuropathic pain.  Addendum: Elevated AST 276 and ALT 158: I discussed with her that it is related to medications.  She is taking several supplements which she thinks are B complex.  I instructed her to stop all of them.  I also instructed her to stop the anastrozole . We want to recheck her labs in 1 week and follow-up.    No orders of the defined types were placed in this encounter.  The patient has a good understanding of the overall plan. she agrees with it. she will call with any problems that may develop before the next visit here. Total time spent: 30 mins including face to face time and time spent for planning, charting and co-ordination of care   Tamsen Meek, MD 01/26/23    I Janan Ridge am acting as a Neurosurgeon for [employer]  I have reviewed the above documentation for accuracy and completeness, and I agree with the above.

## 2023-01-26 NOTE — Assessment & Plan Note (Addendum)
eft breast upper outer quadrant biopsy 02/07/2021 for a clinical T2 Ni, stage IIIA invasive ductal carcinoma, grade 3, 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  Neoadjuvant chemotherapy with weekly Taxol x12 followed by dose dense Adriamycin and Cytoxan  2. 08/21/2021:Left lumpectomy: Negative for residual cancer.  Margins negative, 0/14 lymph nodes negative 3.  Followed by radiation completed 11/07/2021 4.  Followed by antiestrogen therapy with anastrozole started June 2023 -------------------------------------------------------------------------------------------------------------------------------- Neuropathy:Patient is currently on 400 mg of gabapentin 3 times a day and 40 mg of Cymbalta twice a day   Breast cancer surveillance:: Mammogram and ultrasound 05/27/2022: Palpable area of concern benign cyst 2 cm 07/07/2022 ultrasound of the liver multiple liver cysts largest 9.9 cm, fatty liver  Anastrozole toxicities: Hot flashes can be moderate to severe  Tenderness in the left axilla: Post radiation effects.  Previous ultrasound was negative.  I encouraged her to apply CBD oil.  She takes Lyrica for neuropathic pain.  Return to clinic in 1 year for follow-up

## 2023-01-28 ENCOUNTER — Telehealth: Payer: Self-pay | Admitting: Hematology and Oncology

## 2023-01-29 ENCOUNTER — Other Ambulatory Visit: Payer: Self-pay | Admitting: Hematology and Oncology

## 2023-01-29 DIAGNOSIS — Z170 Estrogen receptor positive status [ER+]: Secondary | ICD-10-CM

## 2023-01-29 DIAGNOSIS — D508 Other iron deficiency anemias: Secondary | ICD-10-CM

## 2023-02-02 ENCOUNTER — Inpatient Hospital Stay: Payer: BC Managed Care – PPO

## 2023-02-03 ENCOUNTER — Inpatient Hospital Stay: Payer: BC Managed Care – PPO | Admitting: Hematology and Oncology

## 2023-02-03 NOTE — Assessment & Plan Note (Deleted)
eft breast upper outer quadrant biopsy 02/07/2021 for a clinical T2 Ni, stage IIIA invasive ductal carcinoma, grade 3, 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  Neoadjuvant chemotherapy with weekly Taxol x12 followed by dose dense Adriamycin and Cytoxan  2. 08/21/2021:Left lumpectomy: Negative for residual cancer.  Margins negative, 0/14 lymph nodes negative 3.  Followed by radiation completed 11/07/2021 4.  Followed by antiestrogen therapy with anastrozole started June 2023 -------------------------------------------------------------------------------------------------------------------------------- Neuropathy:Patient is currently on 400 mg of gabapentin 3 times a day and 40 mg of Cymbalta twice a day    Breast cancer surveillance:: Mammogram and ultrasound 05/27/2022: Palpable area of concern benign cyst 2 cm 07/07/2022 ultrasound of the liver multiple liver cysts largest 9.9 cm, fatty liver   Anastrozole toxicities: Hot flashes can be moderate to severe Elevated LFTs: Unclear etiology.  I have discussed with her about stopping anastrozole for a week and rechecking the labs.

## 2023-02-09 ENCOUNTER — Other Ambulatory Visit: Payer: Self-pay

## 2023-02-09 DIAGNOSIS — D508 Other iron deficiency anemias: Secondary | ICD-10-CM

## 2023-02-09 DIAGNOSIS — Z170 Estrogen receptor positive status [ER+]: Secondary | ICD-10-CM

## 2023-02-10 ENCOUNTER — Inpatient Hospital Stay: Payer: BC Managed Care – PPO

## 2023-02-17 ENCOUNTER — Encounter: Payer: Self-pay | Admitting: Pharmacist

## 2023-02-18 ENCOUNTER — Encounter: Payer: Self-pay | Admitting: Hematology and Oncology

## 2023-02-18 ENCOUNTER — Inpatient Hospital Stay: Payer: BC Managed Care – PPO | Admitting: Hematology and Oncology

## 2023-02-18 NOTE — Assessment & Plan Note (Signed)
eft breast upper outer quadrant biopsy 02/07/2021 for a clinical T2 Ni, stage IIIA invasive ductal carcinoma, grade 3, 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  Neoadjuvant chemotherapy with weekly Taxol x12 followed by dose dense Adriamycin and Cytoxan  2. 08/21/2021:Left lumpectomy: Negative for residual cancer.  Margins negative, 0/14 lymph nodes negative 3.  Followed by radiation completed 11/07/2021 4.  Followed by antiestrogen therapy with anastrozole started June 2023 -------------------------------------------------------------------------------------------------------------------------------- Neuropathy:Patient is currently on 400 mg of gabapentin 3 times a day and 40 mg of Cymbalta twice a day    Breast cancer surveillance:: Mammogram and ultrasound 05/27/2022: Palpable area of concern benign cyst 2 cm 07/07/2022 ultrasound of the liver multiple liver cysts largest 9.9 cm, fatty liver   Anastrozole toxicities: Hot flashes can be moderate to severe Elevated LFTs: Unclear etiology.  I have discussed with her about stopping anastrozole for a week and rechecking the labs.   Tenderness in the left axilla: Post radiation effects.  Previous ultrasound was negative.  I encouraged her to apply CBD oil.  She takes Lyrica for neuropathic pain.   Addendum: Elevated AST 276 and ALT 158: I discussed with her that it is related to medications.  She is taking several supplements which she thinks are B complex.  I instructed her to stop all of them.  I also instructed her to stop the anastrozole . We want to recheck her labs in 1 week and follow-up.

## 2023-02-20 ENCOUNTER — Inpatient Hospital Stay: Payer: BC Managed Care – PPO | Attending: Hematology and Oncology | Admitting: Hematology and Oncology

## 2023-02-20 ENCOUNTER — Other Ambulatory Visit: Payer: Self-pay | Admitting: *Deleted

## 2023-02-20 DIAGNOSIS — R7401 Elevation of levels of liver transaminase levels: Secondary | ICD-10-CM

## 2023-02-20 DIAGNOSIS — Z170 Estrogen receptor positive status [ER+]: Secondary | ICD-10-CM | POA: Diagnosis not present

## 2023-02-20 DIAGNOSIS — C50412 Malignant neoplasm of upper-outer quadrant of left female breast: Secondary | ICD-10-CM

## 2023-02-20 NOTE — Assessment & Plan Note (Addendum)
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  Neoadjuvant chemotherapy with weekly Taxol x12 followed by dose dense Adriamycin and Cytoxan  2. 08/21/2021:Left lumpectomy: Negative for residual cancer.  Margins negative, 0/14 lymph nodes negative 3.  Followed by radiation completed 11/07/2021 4.  Followed by antiestrogen therapy with anastrozole started June 2023 -------------------------------------------------------------------------------------------------------------------------------- Neuropathy:Patient is currently on 400 mg of gabapentin 3 times a day and 40 mg of Cymbalta twice a day    Breast cancer surveillance:: Mammogram and ultrasound 05/27/2022: Palpable area of concern benign cyst 2 cm 07/07/2022 ultrasound of the liver multiple liver cysts largest 9.9 cm, fatty liver   Anastrozole toxicities: Hot flashes can be moderate to severe Elevated LFTs: Unclear etiology.  I have discussed with her about stopping anastrozole for a week and rechecking the labs.   Tenderness in the left axilla: Post radiation effects.  Previous ultrasound was negative.  I encouraged her to apply CBD oil.  She takes Lyrica for neuropathic pain.   Addendum: Elevated AST 276 and ALT 158 02/17/2023: AST 26, ALT 37, alk phos 118, ferritin 420.7  The liver enzymes have normalized.  I recommend that she restart her antiestrogen therapy and recheck labs in 2 weeks. I instructed her not to restart her supplements.

## 2023-02-20 NOTE — Progress Notes (Signed)
RN sucessfully faxed orders to [facility]([phone]) located in [facility] [phone])

## 2023-02-20 NOTE — Progress Notes (Signed)
HEMATOLOGY-ONCOLOGY TELEPHONE VISIT PROGRESS NOTE  I connected with our patient on 02/20/23 at 12:15 PM EDT by telephone and verified that I am speaking with the correct person using two identifiers.  I discussed the limitations, risks, security and privacy concerns of performing an evaluation and management service by telephone and the availability of in person appointments.  I also discussed with the patient that there may be a patient responsible charge related to this service. The patient expressed understanding and agreed to proceed.   History of Present Illness: Follow-up to discuss results of liver function tests   Catherine Ellison is a 47 year old lady with above-mentioned history of breast cancer who was on antiestrogen therapy with anastrozole along with that multiple supplements.  When we saw her on 01/26/2023 she had markedly elevated LFTs AST was 276 and ALT was 158.  Instructed her to stop both the supplements and anastrozole and to repeat labs again.  She had blood work repeated again on 02/17/2023 which showed normalization of AST and ALT.  She connected with me by telephone to discuss treatment plan going forwards.  She continues to have pain in the left axilla.  CBD oil has not helped her.   Oncology History  Malignant neoplasm of upper-outer quadrant of left breast in female, estrogen receptor positive (HCC)  02/11/2021 Initial Diagnosis   left breast upper outer quadrant biopsy 02/07/2021 for a clinical T2 Ni, stage IIIA invasive ductal carcinoma, grade 3, ER 70% weak, PR 0% and HER2 negative, Ki-67: 40%   02/13/2021 Cancer Staging   Staging form: Breast, AJCC 8th Edition - Clinical stage from 02/13/2021: Stage IIIA (cT2, cN1, cM0, G3, ER+, PR-, HER2-) - Signed by Lowella Dell, MD on 02/13/2021 Stage prefix: Initial diagnosis Histologic grading system: 3 grade system Laterality: Left Staged by: Pathologist and managing physician Stage used in treatment planning: Yes National  guidelines used in treatment planning: Yes Type of national guideline used in treatment planning: NCCN   02/19/2021 Genetic Testing   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    Genes tested: APC, ATM, AXIN2, BARD1, BMPR1A, BRCA1, BRCA2, BRIP1, CDH1, CDK4, CDKN2A, CHEK2, DICER1, EPCAM, GREM1, HOXB13, MEN1, MLH1, MSH2, MSH3, MSH6, MUTYH, NBN, NF1, NF2, NTHL1, PALB2, PMS2, POLD1, POLE, PTEN, RAD51C, RAD51D, RECQL, RET, SDHA, SDHAF2, SDHB, SDHC, SDHD, SMAD4, SMARCA4, STK11, TP53, TSC1, TSC2, and VHL.  RNA data is routinely analyzed for use in variant interpretation for all genes.  Recommendations:     Management Recommendations for individuals with a single MUTYH pathogenic variant:  Changes in the MUTYH gene are associated with MUTYH-associated Polyposis syndrome (MAP). MAP is a hereditary cancer condition in which patients have high risks for colorectal cancer due to a large number of adenomatous polyps in the gastrointestinal system. MAP is unique among the hereditary cancer syndromes in that it is a "recessive" condition. Most hereditary cancer conditions are "dominant", meaning the condition is present in patients with a mutation in one copy of the gene. Patients have MAP only if there are mutations in both of their copies of the MUTYH gene. 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    Colon Cancer Screening: If an individual has a first-degree relative with colorectal cancer, screening should begin at age 5 or 10 years prior to the relative's age  at diagnosis, whichever comes first and repeat colonoscopies every 5 years.   If an individual has no first-degree relatives with colorectal cancer, data is unclear if specialized screening is warranted. We advise these patients to follow  their gastroenterologists recommendations.   03/01/2021 -  Neo-Adjuvant Chemotherapy   neoadjuvant chemotherapy will consist of paclitaxel weekly x12 followed by dose dense doxorubicin and cyclophosphamide x4   08/21/2021 Surgery   Left lumpectomy: Negative for residual cancer.  Margins negative, 0/14 lymph nodes negative   08/21/2021 Cancer Staging   Staging form: Breast, AJCC 8th Edition - Pathologic stage from 08/21/2021: pT0, pN0, cM0 - Signed by Loa Socks, NP on 10/30/2021 Stage prefix: Initial diagnosis   09/30/2021 - 11/07/2021 Radiation Therapy   Adjuvant radiation   10/2021 -  Anti-estrogen oral therapy   Anastrozole daily     REVIEW OF SYSTEMS:   Constitutional: Denies fevers, chills or abnormal weight loss All other systems were reviewed with the patient and are negative. Observations/Objective:     Assessment Plan:  Malignant neoplasm of upper-outer quadrant of left breast in female, estrogen receptor positive (HCC) Left breast upper outer quadrant biopsy 02/07/2021 for a clinical T2 Ni, stage IIIA invasive ductal carcinoma, grade 3, 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  Neoadjuvant chemotherapy with weekly Taxol x12 followed by dose dense Adriamycin and Cytoxan  2. 08/21/2021:Left lumpectomy: Negative for residual cancer.  Margins negative, 0/14 lymph nodes negative 3.  Followed by radiation completed 11/07/2021 4.  Followed by antiestrogen therapy with anastrozole started June 2023 -------------------------------------------------------------------------------------------------------------------------------- Neuropathy:Patient is currently on 400 mg of gabapentin 3 times a day and 40 mg of Cymbalta twice a day    Breast cancer surveillance:: Mammogram and ultrasound 05/27/2022: Palpable area of concern benign cyst 2 cm 07/07/2022 ultrasound of the liver multiple liver cysts largest 9.9 cm, fatty liver   Anastrozole  toxicities: Hot flashes can be moderate to severe Elevated LFTs: Unclear etiology.  I have discussed with her about stopping anastrozole for a week and rechecking the labs.   Tenderness in the left axilla: Post radiation effects.  Previous ultrasound was negative.  I encouraged her to apply CBD oil.  She takes Lyrica for neuropathic pain.   Addendum: Elevated AST 276 and ALT 158 02/17/2023: AST 26, ALT 37, alk phos 118, ferritin 420.7  The liver enzymes have normalized.  I recommend that she restart her antiestrogen therapy and recheck labs in 2 weeks. I instructed her not to restart her supplements.    I discussed the assessment and treatment plan with the patient. The patient was provided an opportunity to ask questions and all were answered. The patient agreed with the plan and demonstrated an understanding of the instructions. The patient was advised to call back or seek an in-person evaluation if the symptoms worsen or if the condition fails to improve as anticipated.   I provided 12 minutes of non-face-to-face time during this encounter.  This includes time for charting and coordination of care   Tamsen Meek, MD

## 2023-02-23 ENCOUNTER — Encounter: Payer: Self-pay | Admitting: Neurology

## 2023-02-28 IMAGING — MG MM BREAST LOCALIZATION CLIP
4 series · 4 of 12 positions shown · non-contrast
Comparison: Previous exam(s).

CLINICAL DATA: Evaluate biopsy marker

EXAM:
3D DIAGNOSTIC RIGHT MAMMOGRAM POST ULTRASOUND BIOPSY

[R ML synth-2D]
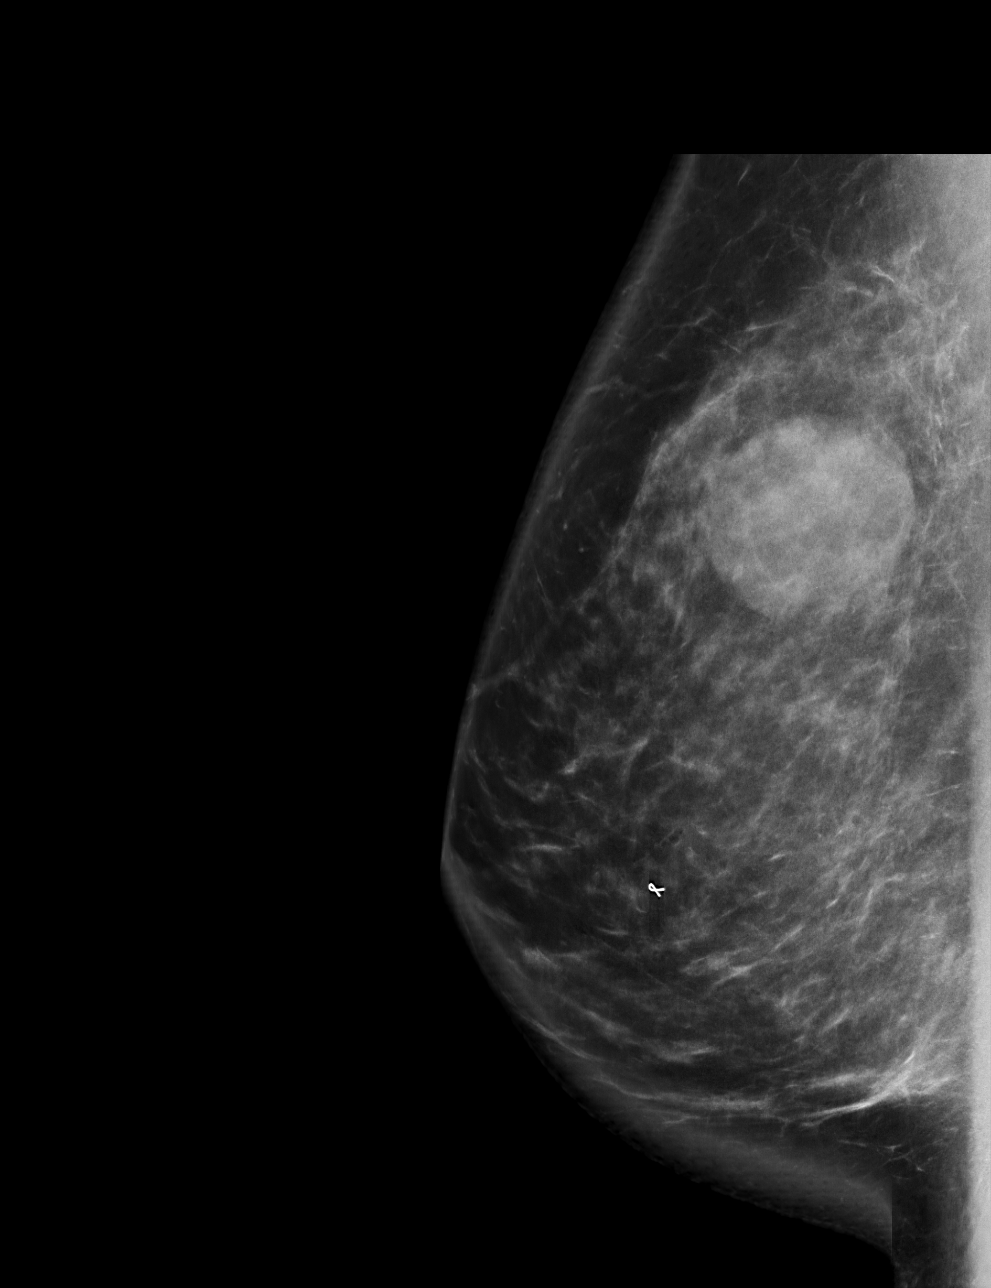

[R CC synth-2D]
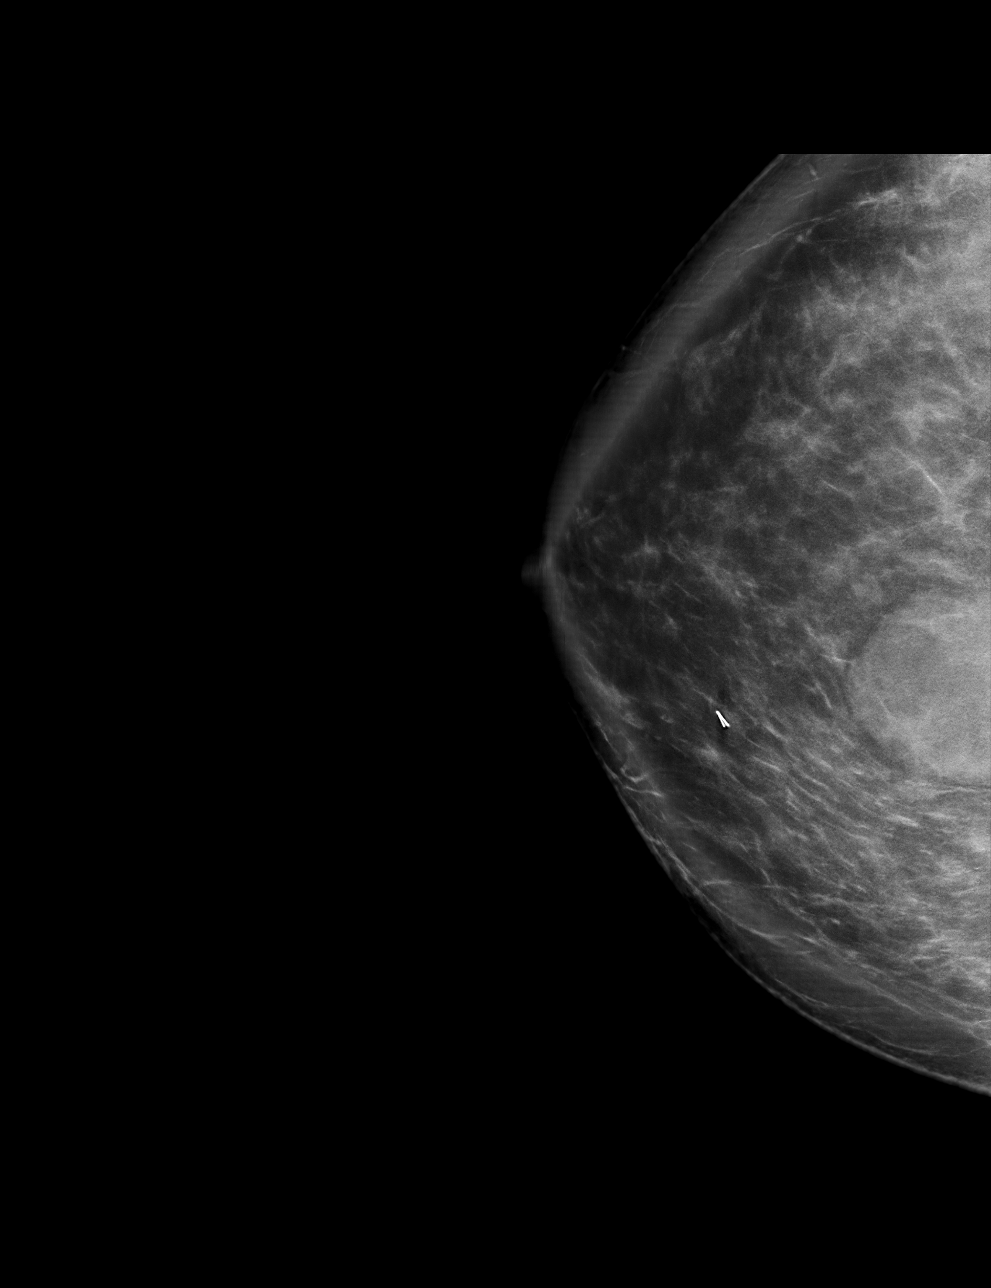

[R ML tomo · tomo slice 57/114.0]
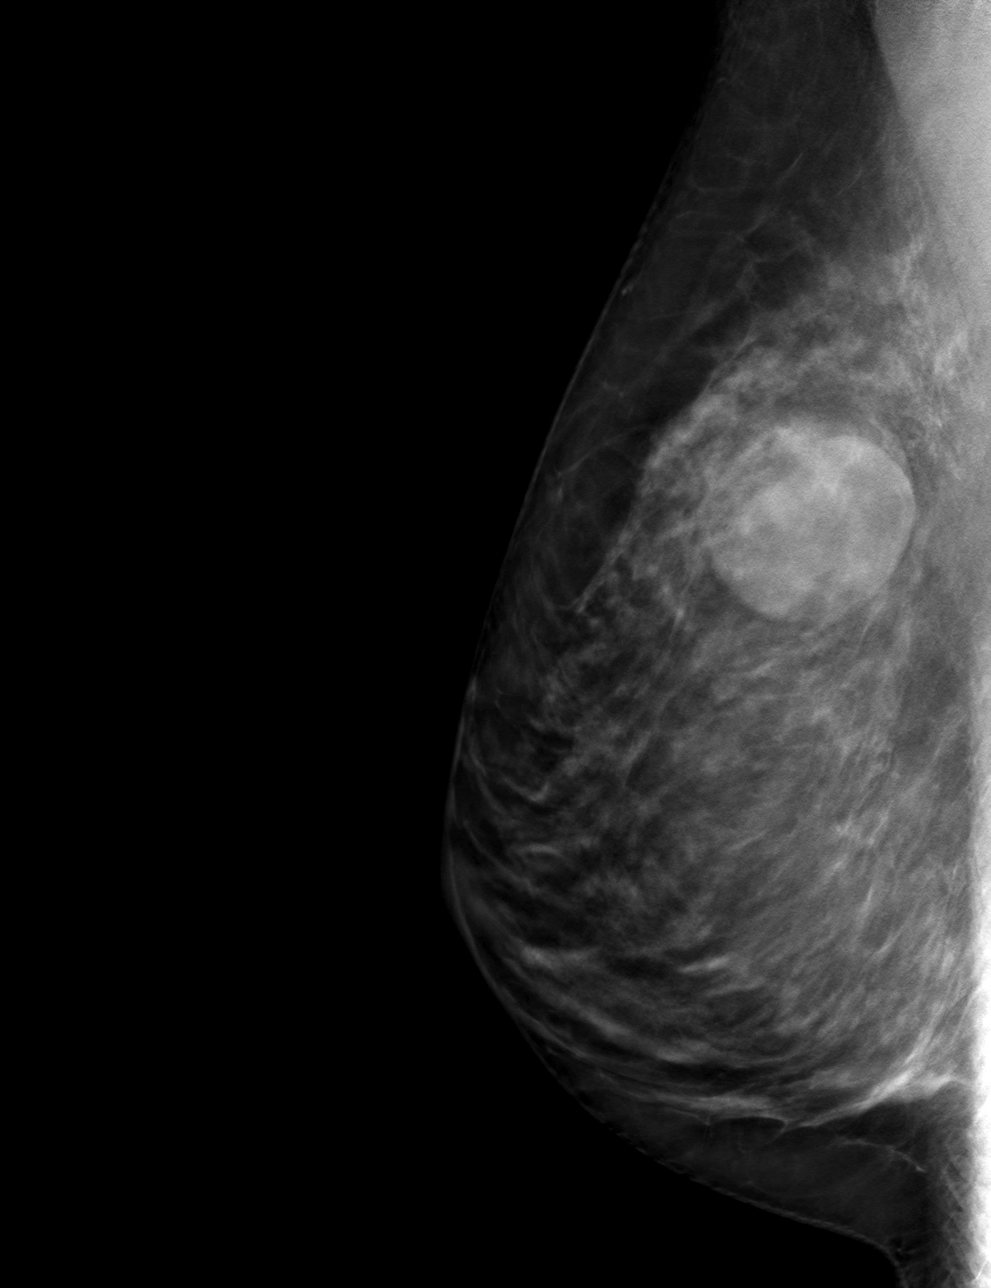

[R CC tomo · tomo slice 58/115.0]
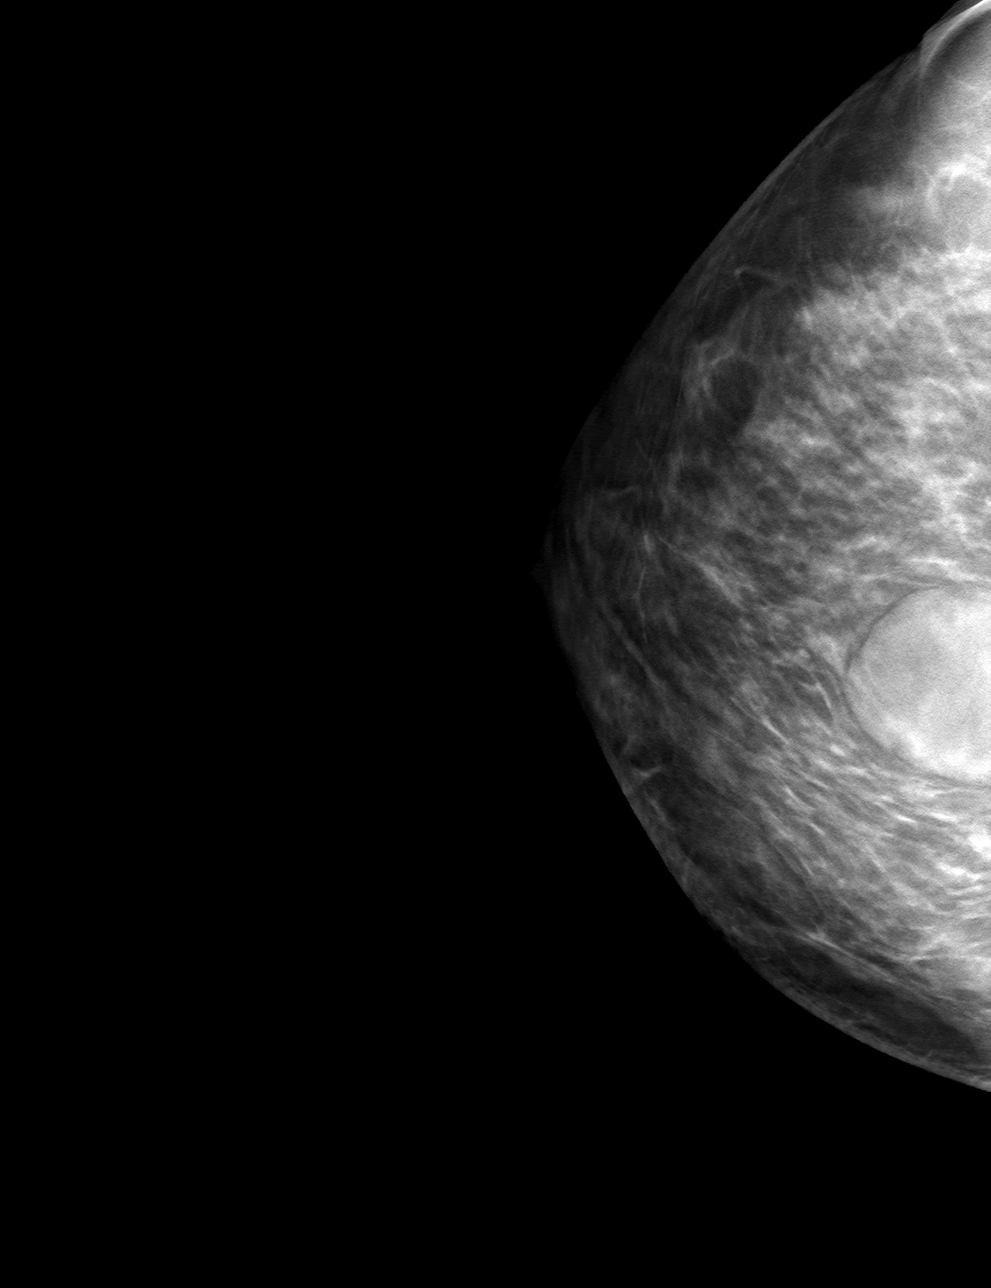

[4 of 12 positions shown; findings below may reference images not displayed]

FINDINGS: 3D Mammographic images were obtained following ultrasound guided
biopsy of a 3 o'clock right breast mass. The biopsy marking clip is
in expected position at the site of biopsy.
IMPRESSION: Appropriate positioning of the ribbon shaped biopsy marking clip at
the site of biopsy in the region of the biopsied 3 o'clock right
breast mass.

Final Assessment: Post Procedure Mammograms for Marker Placement

## 2023-02-28 IMAGING — US US BREAST*L* LIMITED INC AXILLA
1 series · 11 of 11 positions shown · non-contrast
Comparison: Previous exam(s).

CLINICAL DATA: The patient recently had pain, erythema, warmth to
the touch, and skin thickening on the left thought to represent
infection. The patient was treated with 10 days of Augmentin with
significant improvement. However, significant symptoms remain.
Specifically, the patient has a painful lump in the upper outer left
breast. The patient has a history of breast cysts identified in Wednesday November, 2016.



[Series 1: us breast*left* limited inc axilla · 0.08mm/px · 11 of 11 slices shown]
[im 1/11]
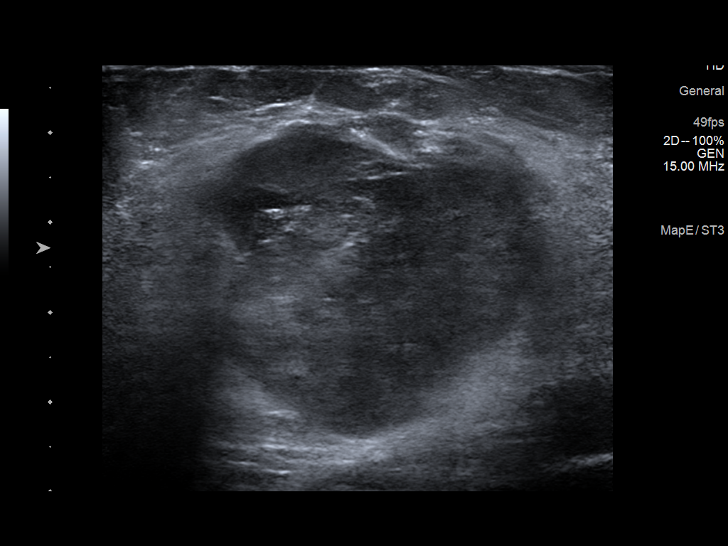
[im 2/11]
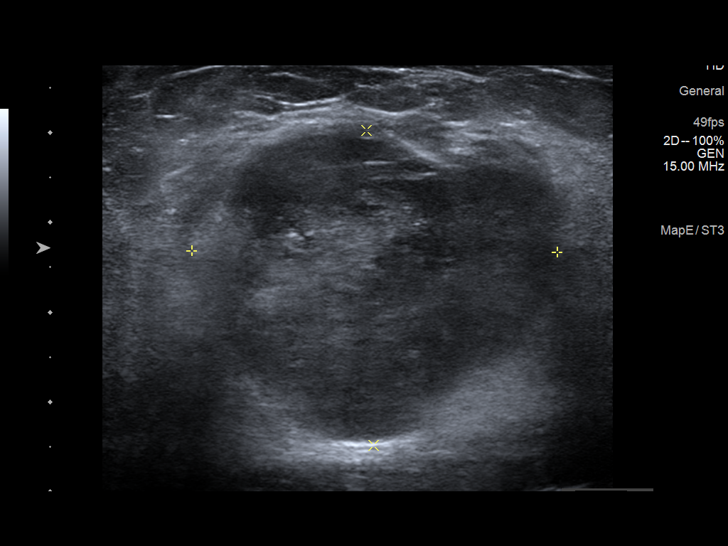
[im 3/11]
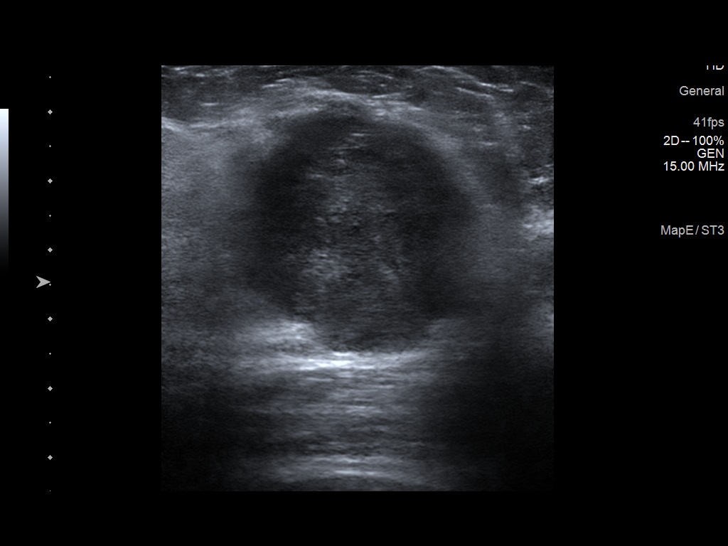
[im 4/11]
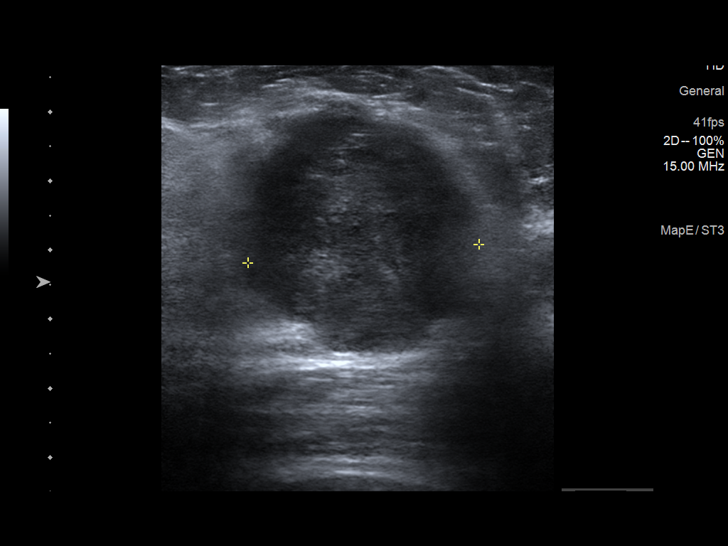
[im 5/11]
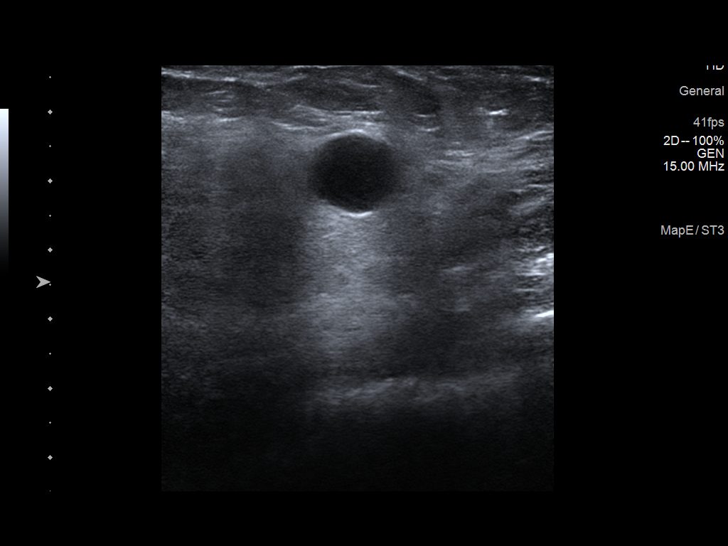
[im 6/11]
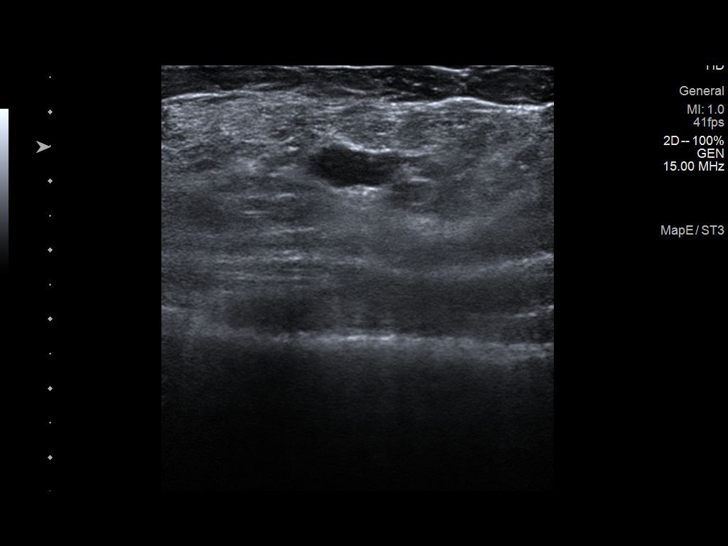
[im 7/11]
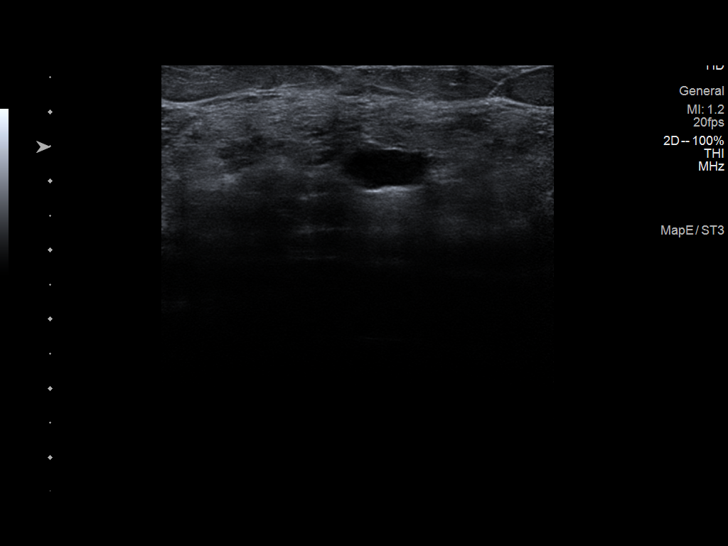
[im 8/11]
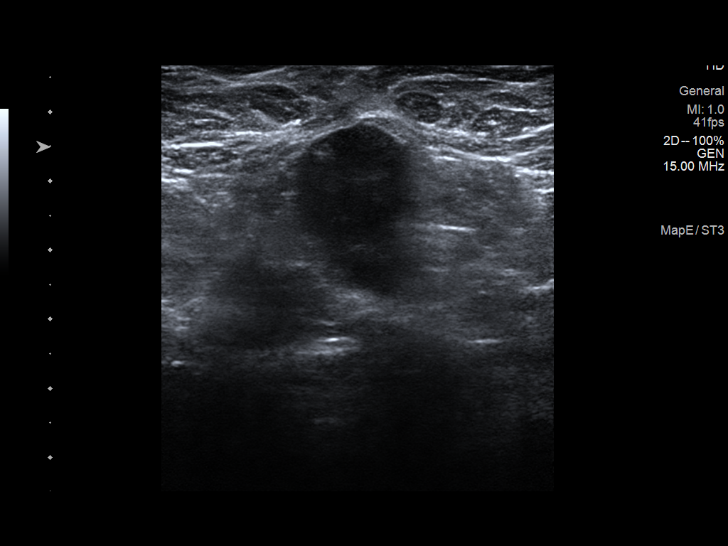
[im 9/11]
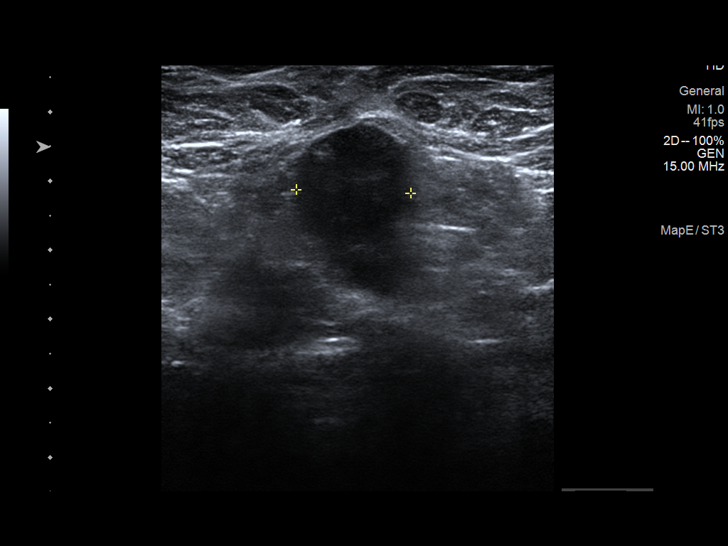
[im 10/11]
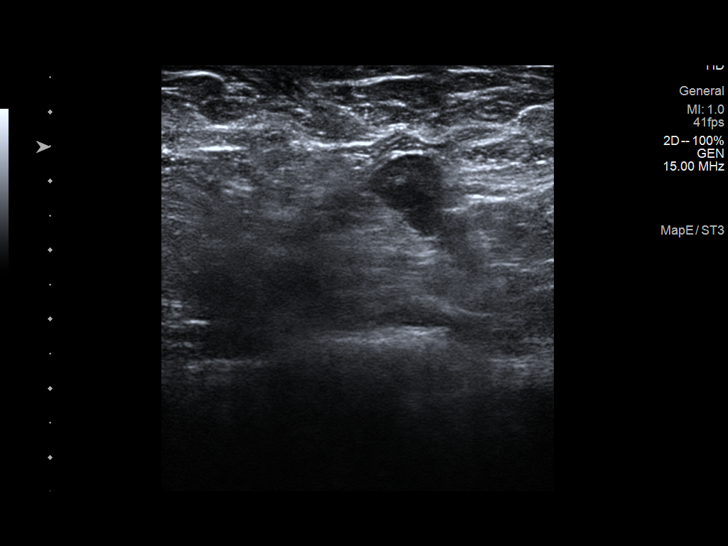
[im 11/11]
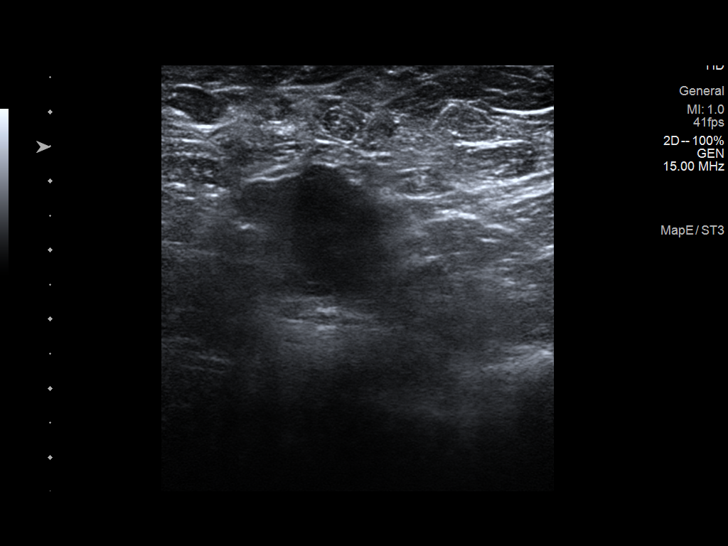

[11 of 11 positions shown; findings below may reference images not displayed]

ACR Breast Density Category c: The breast tissue is heterogeneously
dense, which may obscure small masses.
FINDINGS: There is a mass in the upper right breast at a posterior depth.
There is a possible mass located just lateral to the dominant mass
only seen on the cc view. No other mammographic findings are seen on
the right.

There is skin and trabecular thickening on the left. The patient is
palpating a mass in the upper outer left breast which is new since
5457. Cysts seen at 12 o'clock in the left breast in 5457 have
resolved. A mass in the inferior medial left breast is new in the
interval.

No other suspicious findings in either breast.

On physical exam, there is mild warmth to the touch on the left. A
palpable lump is identified in the upper-outer left breast.

Targeted ultrasound is performed, showing a dominant simple cyst at
1 o'clock in the right breast correlating with the dominant mass
seen mammographically. Cysts are also seen at 2 o'clock and 11
o'clock correlating with the other mammographic findings. There is
an irregular solid appearing mass at 3 o'clock, 1 cm from the nipple
measuring 9 x 8 by 14 mm. The right axilla was not examined.

Multiple cysts are seen in the left breast including at 2 o'clock
and 8 o'clock correlating with mammographic findings. The dominant
mass in the left breast seen on mammogram correlates with a
hypoechoic mass at 2 o'clock, 5 cm from the nipple measuring 4.1 x
3.5 x 3.4 cm. This represents the patient's palpable lump. There is
increased through transmission. Three abnormal nodes are seen in the
right axilla with thickened cortices. A representative node
demonstrates a cortex of 17 mm.
IMPRESSION: 1. Bilateral fibrocystic changes.
2. Irregular solid mass in the right breast at 3 o'clock, 1 cm from
the nipple. The axilla was inadvertently not imaged.
3. Indeterminate mass in the left breast at 2 o'clock, 5 cm from the
nipple. This could represent an abscess or a solid mass.
4. Three abnormal nodes in the left axilla.

RECOMMENDATION:
Recommend aspiration versus biopsy of the 2 o'clock left breast
mass. If this mass does not aspirate and requires biopsy, recommend
biopsy of 1 of the 3 abnormal left axillary lymph nodes. If the mass
does aspirate and is thought to represent an abscess, recommend
follow-up of the abnormal left axillary nodes. Recommend biopsy of
the mass in the right breast at 3 o'clock, 1 cm from the nipple.
Recommend ultrasound of the right axilla at the time of this biopsy.

I have discussed the findings and recommendations with the patient.
If applicable, a reminder letter will be sent to the patient
regarding the next appointment.

BI-RADS CATEGORY  4: Suspicious.

## 2023-02-28 IMAGING — MG DIGITAL DIAGNOSTIC BILAT W/ TOMO W/ CAD
6 of 12 series · 6 of 36 positions shown · non-contrast
Comparison: Previous exam(s).

CLINICAL DATA: The patient recently had pain, erythema, warmth to
the touch, and skin thickening on the left thought to represent
infection. The patient was treated with 10 days of Augmentin with
significant improvement. However, significant symptoms remain.
Specifically, the patient has a painful lump in the upper outer left
breast. The patient has a history of breast cysts identified in Wednesday November, 2016.



[L MLO synth-2D]
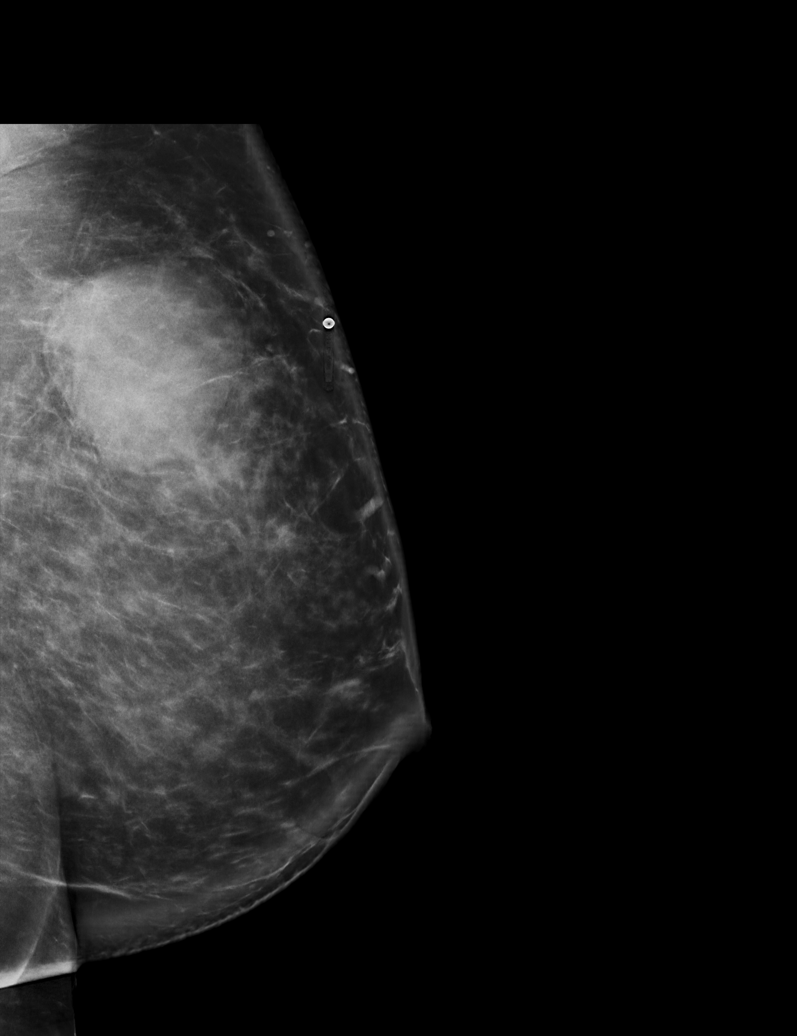

[R CC synth-2D (1 of 2)]
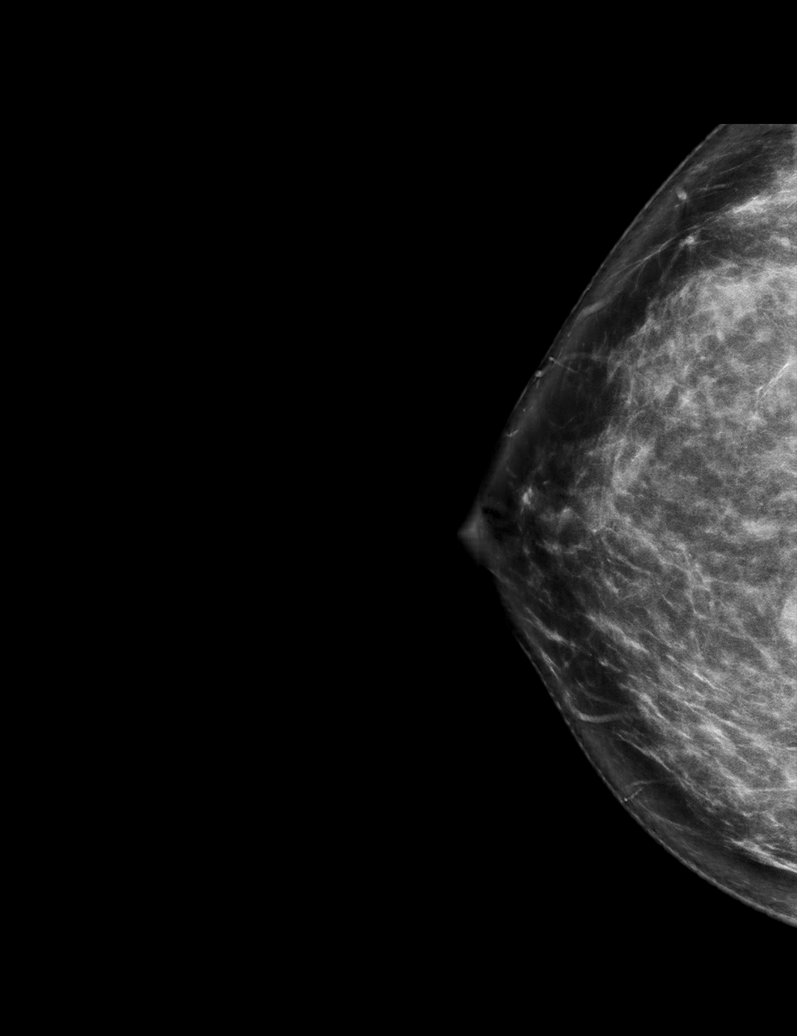

[L CC synth-2D]
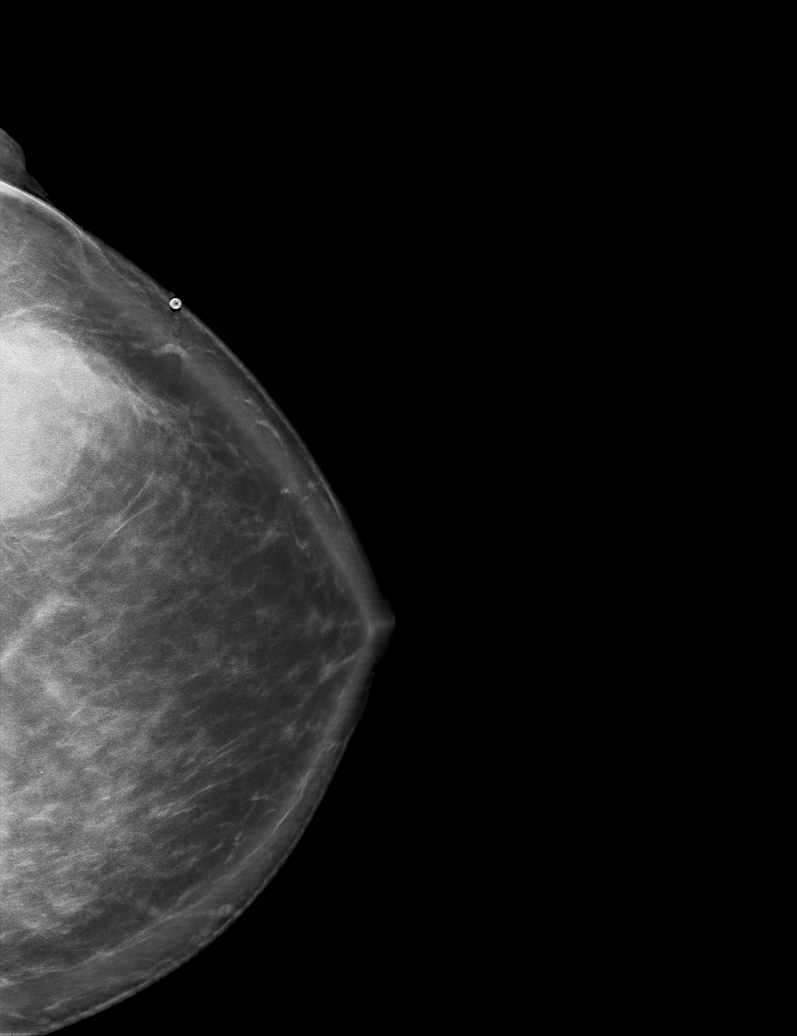

[L TAN synth-2D]
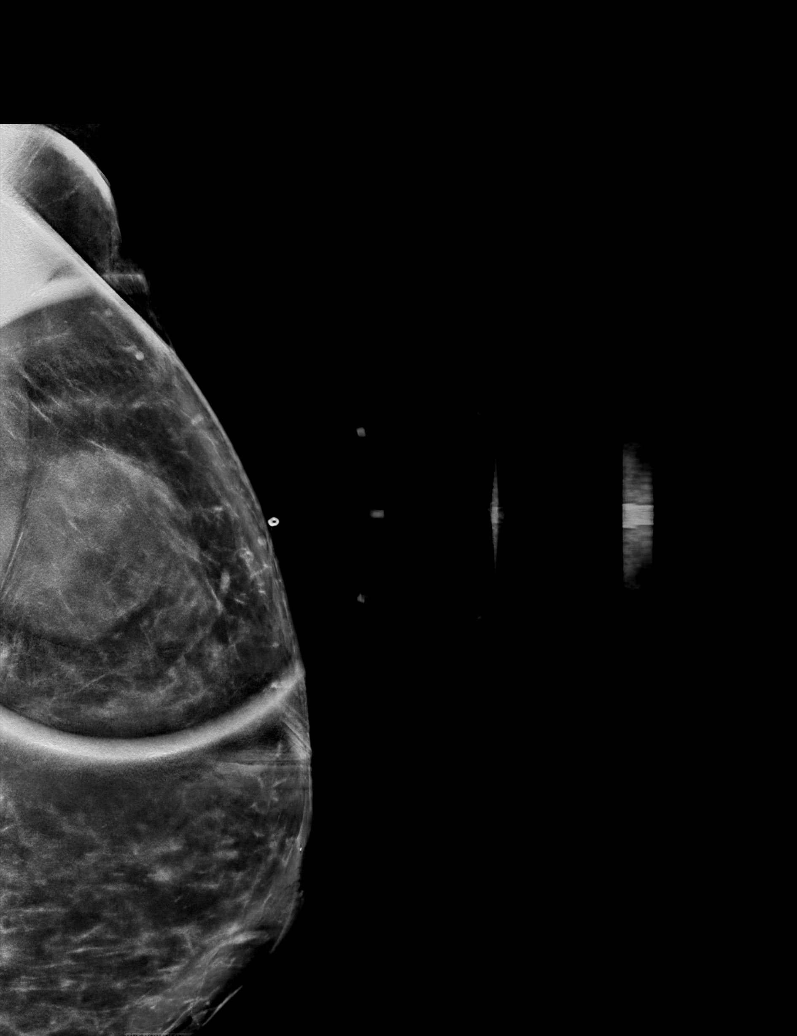

[R CC synth-2D (2 of 2)]
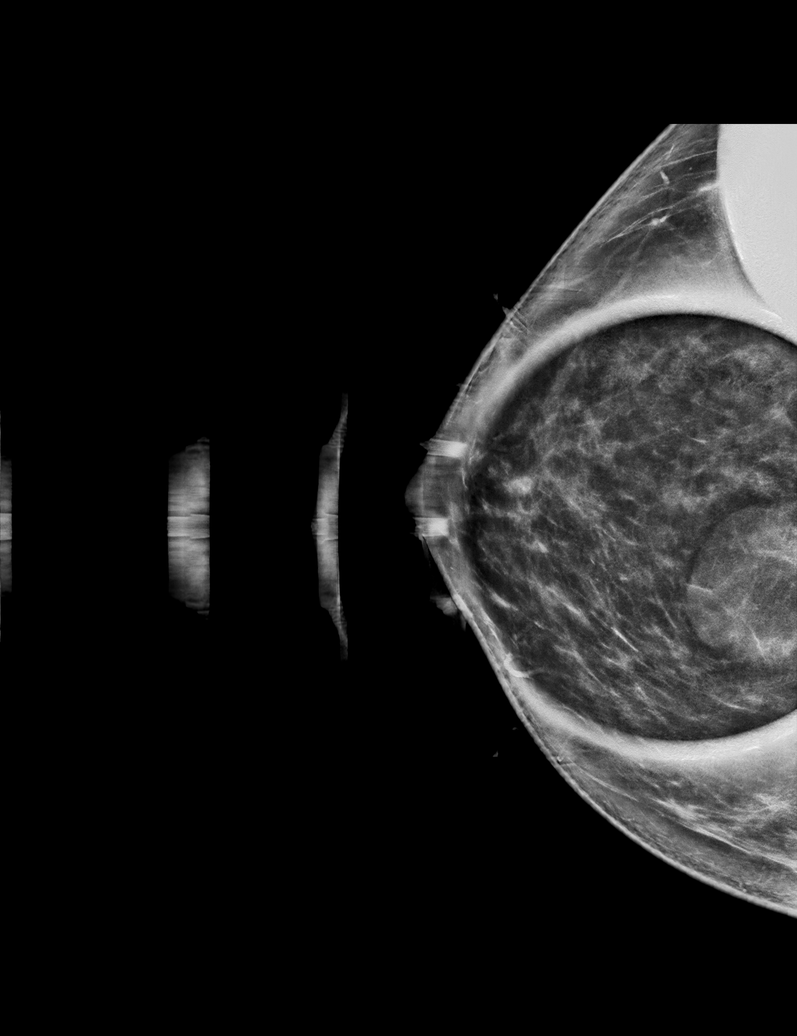

[R MLO synth-2D]
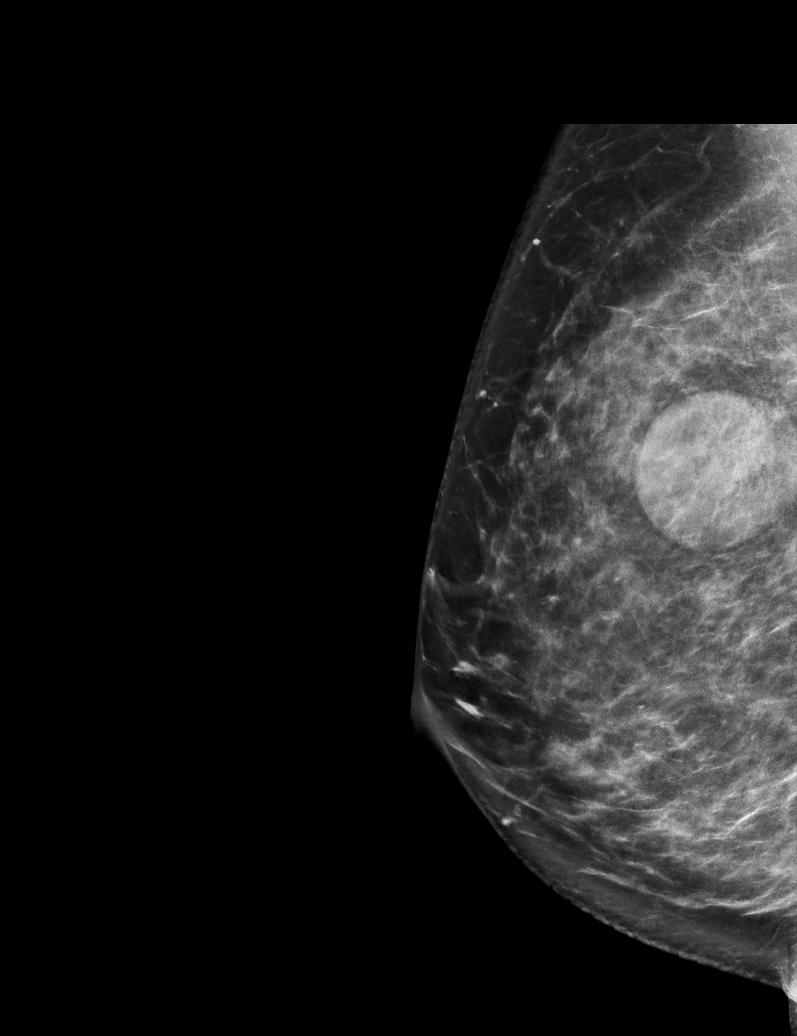

[6 of 36 positions shown; findings below may reference images not displayed]

ACR Breast Density Category c: The breast tissue is heterogeneously
dense, which may obscure small masses.
FINDINGS: There is a mass in the upper right breast at a posterior depth.
There is a possible mass located just lateral to the dominant mass
only seen on the cc view. No other mammographic findings are seen on
the right.

There is skin and trabecular thickening on the left. The patient is
palpating a mass in the upper outer left breast which is new since
5457. Cysts seen at 12 o'clock in the left breast in 5457 have
resolved. A mass in the inferior medial left breast is new in the
interval.

No other suspicious findings in either breast.

On physical exam, there is mild warmth to the touch on the left. A
palpable lump is identified in the upper-outer left breast.

Targeted ultrasound is performed, showing a dominant simple cyst at
1 o'clock in the right breast correlating with the dominant mass
seen mammographically. Cysts are also seen at 2 o'clock and 11
o'clock correlating with the other mammographic findings. There is
an irregular solid appearing mass at 3 o'clock, 1 cm from the nipple
measuring 9 x 8 by 14 mm. The right axilla was not examined.

Multiple cysts are seen in the left breast including at 2 o'clock
and 8 o'clock correlating with mammographic findings. The dominant
mass in the left breast seen on mammogram correlates with a
hypoechoic mass at 2 o'clock, 5 cm from the nipple measuring 4.1 x
3.5 x 3.4 cm. This represents the patient's palpable lump. There is
increased through transmission. Three abnormal nodes are seen in the
right axilla with thickened cortices. A representative node
demonstrates a cortex of 17 mm.
IMPRESSION: 1. Bilateral fibrocystic changes.
2. Irregular solid mass in the right breast at 3 o'clock, 1 cm from
the nipple. The axilla was inadvertently not imaged.
3. Indeterminate mass in the left breast at 2 o'clock, 5 cm from the
nipple. This could represent an abscess or a solid mass.
4. Three abnormal nodes in the left axilla.

RECOMMENDATION:
Recommend aspiration versus biopsy of the 2 o'clock left breast
mass. If this mass does not aspirate and requires biopsy, recommend
biopsy of 1 of the 3 abnormal left axillary lymph nodes. If the mass
does aspirate and is thought to represent an abscess, recommend
follow-up of the abnormal left axillary nodes. Recommend biopsy of
the mass in the right breast at 3 o'clock, 1 cm from the nipple.
Recommend ultrasound of the right axilla at the time of this biopsy.

I have discussed the findings and recommendations with the patient.
If applicable, a reminder letter will be sent to the patient
regarding the next appointment.

BI-RADS CATEGORY  4: Suspicious.

## 2024-01-09 ENCOUNTER — Other Ambulatory Visit: Payer: Self-pay | Admitting: Hematology and Oncology

## 2024-01-26 ENCOUNTER — Inpatient Hospital Stay: Payer: BC Managed Care – PPO | Attending: Hematology and Oncology | Admitting: Hematology and Oncology

## 2024-01-26 NOTE — Assessment & Plan Note (Deleted)
 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  Neoadjuvant chemotherapy with weekly Taxol  x12 followed by dose dense Adriamycin  and Cytoxan   2. 08/21/2021:Left lumpectomy: Negative for residual cancer.  Margins negative, 0/14 lymph nodes negative 3.  Followed by radiation completed 11/07/2021 4.  Followed by antiestrogen therapy with anastrozole  started June 2023 -------------------------------------------------------------------------------------------------------------------------------- Neuropathy:Patient is currently on 400 mg of gabapentin  3 times a day and 40 mg of Cymbalta  twice a day    Breast cancer surveillance:: Mammogram and ultrasound 05/27/2022: Palpable area of concern benign cyst 2 cm 07/07/2022 ultrasound of the liver multiple liver cysts largest 9.9 cm, fatty liver   Anastrozole  toxicities: Hot flashes can be moderate to severe Elevated LFTs: Improved after stopping anastrozole .  Restarted   02/17/2023: AST 26, ALT 37, alk phos 118, ferritin 420.7   The liver enzymes have normalized.

## 2024-02-04 ENCOUNTER — Inpatient Hospital Stay: Admitting: Hematology and Oncology

## 2024-02-04 NOTE — Assessment & Plan Note (Deleted)
 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  Neoadjuvant chemotherapy with weekly Taxol  x12 followed by dose dense Adriamycin  and Cytoxan   2. 08/21/2021:Left lumpectomy: Negative for residual cancer.  Margins negative, 0/14 lymph nodes negative 3.  Followed by radiation completed 11/07/2021 4.  Followed by antiestrogen therapy with anastrozole  started June 2023 -------------------------------------------------------------------------------------------------------------------------------- Neuropathy:Patient is currently on 400 mg of gabapentin  3 times a day and 40 mg of Cymbalta  twice a day    Breast cancer surveillance:: Mammogram and ultrasound 05/27/2022: Palpable area of concern benign cyst 2 cm 07/07/2022 ultrasound of the liver multiple liver cysts largest 9.9 cm, fatty liver   Anastrozole  toxicities: Hot flashes can be moderate to severe Elevated LFTs: Unclear etiology.  Was able to resume anastrozole  after LFTs normalized   Tenderness in the left axilla: Post radiation effects.  Previous ultrasound was negative.  I encouraged her to apply CBD oil.  She takes Lyrica  for neuropathic pain.   Addendum:  01/26/2023: AST 276 and ALT 158 02/17/2023: AST 26, ALT 37, alk phos 118, ferritin 420.7  Return to clinic in 1 year for follow-up

## 2024-02-23 ENCOUNTER — Inpatient Hospital Stay: Attending: Hematology and Oncology | Admitting: Hematology and Oncology

## 2024-02-23 VITALS — BP 136/75 | HR 116 | Temp 97.4°F | Resp 18 | Ht 71.0 in | Wt 195.6 lb

## 2024-02-23 DIAGNOSIS — Z800 Family history of malignant neoplasm of digestive organs: Secondary | ICD-10-CM

## 2024-02-23 DIAGNOSIS — Z79811 Long term (current) use of aromatase inhibitors: Secondary | ICD-10-CM | POA: Insufficient documentation

## 2024-02-23 DIAGNOSIS — Z1589 Genetic susceptibility to other disease: Secondary | ICD-10-CM

## 2024-02-23 DIAGNOSIS — Z170 Estrogen receptor positive status [ER+]: Secondary | ICD-10-CM | POA: Insufficient documentation

## 2024-02-23 DIAGNOSIS — C50412 Malignant neoplasm of upper-outer quadrant of left female breast: Secondary | ICD-10-CM | POA: Insufficient documentation

## 2024-02-23 MED ORDER — DICYCLOMINE HCL 20 MG PO TABS: 20.0000 mg | ORAL_TABLET | Freq: Four times a day (QID) | ORAL | 3 refills | Status: AC

## 2024-02-23 MED ORDER — CLONIDINE 0.1 MG/24HR TD PTWK: 0.1000 mg | MEDICATED_PATCH | TRANSDERMAL | 12 refills | Status: AC

## 2024-02-23 MED ORDER — OXYCODONE-ACETAMINOPHEN 5-325 MG PO TABS
1.0000 | ORAL_TABLET | ORAL | 0 refills | Status: AC | PRN
Start: 2024-02-23 — End: ?

## 2024-02-23 MED ORDER — LINACLOTIDE 72 MCG PO CAPS: 72.0000 ug | ORAL_CAPSULE | Freq: Every day | ORAL | 3 refills | Status: AC

## 2024-02-23 MED ORDER — FAMOTIDINE 20 MG PO TABS: 20.0000 mg | ORAL_TABLET | Freq: Two times a day (BID) | ORAL | 1 refills | Status: AC

## 2024-02-23 MED ORDER — PANTOPRAZOLE SODIUM 40 MG PO TBEC: 40.0000 mg | DELAYED_RELEASE_TABLET | Freq: Every day | ORAL | 3 refills | Status: AC

## 2024-02-23 NOTE — Progress Notes (Signed)
 Patient Care Team: Ebb Lauraine Flatness, NP as PCP - General (Adult Health Nurse Practitioner) Vernetta Berg, MD as Consulting Physician (General Surgery) Izell Lauraine, MD as Attending Physician (Radiation Oncology) Crawford Morna Pickle, NP as Nurse Practitioner (Hematology and Oncology) Odean Potts, MD as Consulting Physician (Hematology and Oncology) Elicia Claw, MD as Consulting Physician (Gastroenterology)  DIAGNOSIS:  Encounter Diagnosis  Name Primary?   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    Genes tested: APC, ATM, AXIN2, BARD1, BMPR1A, BRCA1, BRCA2, BRIP1, CDH1, CDK4, CDKN2A, CHEK2, DICER1, EPCAM, GREM1, HOXB13, MEN1, MLH1, MSH2, MSH3, MSH6, MUTYH, NBN, NF1, NF2, NTHL1, PALB2, PMS2, POLD1, POLE, PTEN, RAD51C, RAD51D, RECQL, RET, SDHA, SDHAF2, SDHB, SDHC, SDHD,  SMAD4, SMARCA4, STK11, TP53, TSC1, TSC2, and VHL.  RNA data is routinely analyzed for use in variant interpretation for all genes.  Recommendations:     Management Recommendations for individuals with a single MUTYH pathogenic variant:  Changes in the MUTYH gene are associated with MUTYH-associated Polyposis syndrome (MAP). MAP is a hereditary cancer condition in which patients have high risks for colorectal cancer due to a large number of adenomatous polyps in the gastrointestinal system. MAP is unique among the hereditary cancer syndromes in that it is a recessive condition. Most hereditary cancer conditions are dominant", meaning the condition is present in patients with a mutation in one copy of the gene. Patients have MAP only if there are mutations in both of their copies of the MUTYH gene. 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    Colon Cancer Screening: If an individual has a first-degree relative with colorectal cancer, screening should begin at age 27 or 10 years prior to the relative's age at diagnosis, whichever comes first and repeat colonoscopies every 5 years.   If an individual has no first-degree relatives with colorectal cancer, data is unclear if specialized screening is warranted. We advise these patients to follow their gastroenterologists recommendations.   03/01/2021 -  Neo-Adjuvant Chemotherapy   neoadjuvant chemotherapy will consist of paclitaxel  weekly x12 followed by dose dense doxorubicin  and cyclophosphamide  x4   08/21/2021 Surgery   Left lumpectomy: Negative for residual cancer.  Margins negative, 0/14 lymph nodes negative   08/21/2021 Cancer Staging   Staging form: Breast, AJCC 8th Edition - Pathologic stage from 08/21/2021: pT0, pN0, cM0 - Signed by Crawford Morna Pickle, NP on 10/30/2021 Stage prefix: Initial  diagnosis   09/30/2021 - 11/07/2021 Radiation Therapy   Adjuvant  radiation   10/2021 -  Anti-estrogen oral therapy   Anastrozole  daily     CHIEF COMPLIANT: Follow-up on anastrozole  therapy  HISTORY OF PRESENT ILLNESS: History of Present Illness Catherine Ellison is a 48 year old female who presents with shoulder pain and hot flashes.  She is currently on anastrozole  therapy and tolerating it fairly well.  She experiences severe hot flashes, causing significant discomfort and embarrassment, especially at work. Her symptoms include sweating and a wet neck, necessitating breaks from meetings. She is currently on anastrozole  and previously tried Effexor  without success.  Shoulder pain extends to the area under her arm, described as stabbing and radiating upwards. It worsens in certain positions, causing cramping and requiring her to sit up. Voltaren gel provides some relief.  Her liver function, previously elevated, has improved. She is scheduled for another liver ultrasound. Recent blood tests show no issues.     ALLERGIES:  is allergic to latex.  MEDICATIONS:  Current Outpatient Medications  Medication Sig Dispense Refill   acetaminophen  (TYLENOL ) 325 MG tablet Take 650 mg by mouth every 6 (six) hours as needed.     anastrozole  (ARIMIDEX ) 1 MG tablet TAKE 1 TABLET BY MOUTH EVERY DAY 90 tablet 3   dicyclomine  (BENTYL ) 20 MG tablet Take 1 tablet (20 mg total) by mouth every 6 (six) hours. 30 tablet 3   DULoxetine  (CYMBALTA ) 30 MG capsule 30 mg.     DULoxetine  (CYMBALTA ) 60 MG capsule Take 60 mg by mouth daily.     famotidine  (PEPCID ) 20 MG tablet TAKE 1 TABLET BY MOUTH TWICE A DAY 180 tablet 1   ibuprofen (ADVIL) 800 MG tablet Take 800 mg by mouth every 8 (eight) hours as needed for mild pain.     linaclotide  (LINZESS ) 72 MCG capsule Take 1 capsule (72 mcg total) by mouth daily. linzess  90 capsule 3   melatonin 3 MG TABS tablet Take 1 tablet (3 mg total) by mouth at bedtime. 60 tablet 0   oxyCODONE  (OXY IR/ROXICODONE ) 5 MG immediate release tablet  Take 1 tablet (5 mg total) by mouth every 6 (six) hours as needed for severe pain or moderate pain. 30 tablet 0   pantoprazole  (PROTONIX ) 40 MG tablet Take 1 tablet (40 mg total) by mouth daily. 90 tablet 3   Vitamin D , Ergocalciferol , (DRISDOL ) 1.25 MG (50000 UNIT) CAPS capsule TAKE 1 CAPSULE BY MOUTH ONE TIME PER WEEK 4 capsule 2   pregabalin  (LYRICA ) 300 MG capsule Take 1 capsule (300 mg total) by mouth in the morning, at noon, and at bedtime. (Patient not taking: Reported on 02/23/2024)     prochlorperazine  (COMPAZINE ) 10 MG tablet Take 1 tablet (10 mg total) by mouth every 6 (six) hours as needed for nausea or vomiting. 30 tablet 1   No current facility-administered medications for this visit.    PHYSICAL EXAMINATION: ECOG PERFORMANCE STATUS: 1 - Symptomatic but completely ambulatory  Vitals:   02/23/24 1525  BP: 136/75  Pulse: (!) 116  Resp: 18  Temp: (!) 97.4 F (36.3 C)  SpO2: 99%   Filed Weights   02/23/24 1525  Weight: 195 lb 9.6 oz (88.7 kg)    Physical Exam Tenderness in the left axilla and the left breast  (exam performed in the presence of a chaperone)  LABORATORY DATA:  I have reviewed the data as listed    Latest Ref Rng & Units 01/26/2023    1:29  PM 07/24/2022    1:22 PM 04/22/2022    2:02 PM  CMP  Glucose 70 - 99 mg/dL 92  97  899   BUN 6 - 20 mg/dL 17  14  13    Creatinine 0.44 - 1.00 mg/dL 9.01  9.01  9.11   Sodium 135 - 145 mmol/L 134  141  138   Potassium 3.5 - 5.1 mmol/L 3.5  3.6  3.6   Chloride 98 - 111 mmol/L 97  105  104   CO2 22 - 32 mmol/L 30  30  27    Calcium 8.9 - 10.3 mg/dL 8.7  9.9  89.7   Total Protein 6.5 - 8.1 g/dL 7.4  7.4  7.7   Total Bilirubin 0.3 - 1.2 mg/dL 1.0  0.8  0.7   Alkaline Phos 38 - 126 U/L 107  124  139   AST 15 - 41 U/L 276  26  21   ALT 0 - 44 U/L 158  25  20     Lab Results  Component Value Date   WBC 7.8 01/26/2023   HGB 16.3 (H) 01/26/2023   HCT 45.4 01/26/2023   MCV 96.4 01/26/2023   PLT 212 01/26/2023    NEUTROABS 5.5 01/26/2023    ASSESSMENT & PLAN:  Malignant neoplasm of upper-outer quadrant of left breast in female, estrogen receptor positive (HCC) Left breast upper outer quadrant biopsy 02/07/2021 for a clinical T2 Ni, stage IIIA invasive ductal carcinoma, grade 3, 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  Neoadjuvant chemotherapy with weekly Taxol  x12 followed by dose dense Adriamycin  and Cytoxan   2. 08/21/2021:Left lumpectomy: Negative for residual cancer.  Margins negative, 0/14 lymph nodes negative 3.  Followed by radiation completed 11/07/2021 4.  Followed by antiestrogen therapy with anastrozole  started June 2023 -------------------------------------------------------------------------------------------------------------------------------- Neuropathy: On Lyrica  and Cymbalta  twice     Breast cancer surveillance:: Mammogram and ultrasound 05/27/2022: Palpable area of concern benign cyst 2 cm 07/07/2022 ultrasound of the liver multiple liver cysts largest 9.9 cm, fatty liver   Anastrozole  toxicities: Hot flashes can be moderate to severe Elevated LFTs: Unclear etiology.  Normalized after stopping herbal medications and anastrozole .  Anastrozole  restarted   Tenderness in the left axilla: Post radiation effects.  Previous ultrasound was negative.  I encouraged her to apply CBD oil.  She takes Lyrica  for neuropathic pain. I sent a prescription for 20 tablets of Percocets to be used only in emergencies.   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 Severe hot flashes: I sent a prescription for Catapres 0.1 mg patch   No orders of the defined types were placed in this encounter.  The patient has a good understanding of the overall plan. she agrees with it.  she will call with any problems that may develop before the next visit here.  I personally spent a total of 30 minutes in the care of the patient today including preparing to see the patient, getting/reviewing separately obtained history, performing a medically appropriate exam/evaluation, counseling and educating, placing orders, referring and communicating with other health care professionals, documenting clinical information in the EHR, independently interpreting results, communicating results, and coordinating care.   Viinay K Jalayia Bagheri, MD 02/23/24

## 2024-02-23 NOTE — Assessment & Plan Note (Signed)
 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  Neoadjuvant chemotherapy with weekly Taxol  x12 followed by dose dense Adriamycin  and Cytoxan   2. 08/21/2021:Left lumpectomy: Negative for residual cancer.  Margins negative, 0/14 lymph nodes negative 3.  Followed by radiation completed 11/07/2021 4.  Followed by antiestrogen therapy with anastrozole  started June 2023 -------------------------------------------------------------------------------------------------------------------------------- Neuropathy:Patient is currently on 400 mg of gabapentin  3 times a day and 40 mg of Cymbalta  twice a day    Breast cancer surveillance:: Mammogram and ultrasound 05/27/2022: Palpable area of concern benign cyst 2 cm 07/07/2022 ultrasound of the liver multiple liver cysts largest 9.9 cm, fatty liver   Anastrozole  toxicities: Hot flashes can be moderate to severe Elevated LFTs: Unclear etiology.  Normalized after stopping herbal medications and anastrozole .  Anastrozole  restarted   Tenderness in the left axilla: Post radiation effects.  Previous ultrasound was negative.  I encouraged her to apply CBD oil.  She takes Lyrica  for neuropathic pain.   01/26/2023: Elevated AST 276 and ALT 158 (antiestrogen therapy held) 02/17/2023: AST 26, ALT 37, alk phos 118, ferritin 420.7   I instructed her not to restart her supplements.

## 2024-04-22 ENCOUNTER — Telehealth: Payer: Self-pay

## 2024-04-22 NOTE — Telephone Encounter (Signed)
 Return pt phone call. She wanted Information about FMLA.

## 2024-04-27 ENCOUNTER — Telehealth: Payer: Self-pay

## 2024-04-27 NOTE — Telephone Encounter (Signed)
 Pt called inquiring about her FMLA forms, if we had received them and has it been started . I let the pt know that we ave received the forms in office, but has not been started. Let the pt know the office turn around date. Pt verbalized understanding.

## 2024-05-05 ENCOUNTER — Telehealth: Payer: Self-pay

## 2024-05-05 NOTE — Telephone Encounter (Signed)
 Notified the pt in regards to her FMLA form being completed, faxed, and confirmation received. Pt copy was emailed upon request.

## 2024-05-09 ENCOUNTER — Telehealth: Payer: Self-pay

## 2024-05-09 NOTE — Telephone Encounter (Signed)
 Return the pt phone call in regards to her FMLA Form. LVM and call back number.

## 2024-05-10 ENCOUNTER — Telehealth: Payer: Self-pay

## 2024-05-10 NOTE — Telephone Encounter (Signed)
 Reach out to the pt regarding her FMLA forms. Pt stated that a start date wasn't on the form. I let her know the reason for that , was she didn't give me a start date. I was able to accommodate her ad refax the form. Pt verbalized understanding.

## 2025-02-23 ENCOUNTER — Ambulatory Visit: Admitting: Hematology and Oncology
